# Patient Record
Sex: Female | Born: 1975
Health system: Southern US, Community
[De-identification: ages and names within clinical notes are randomized; demographics above are authoritative.]

## PROBLEM LIST (undated history)

## (undated) DIAGNOSIS — R4689 Other symptoms and signs involving appearance and behavior: Secondary | ICD-10-CM

## (undated) DIAGNOSIS — F329 Major depressive disorder, single episode, unspecified: Secondary | ICD-10-CM

## (undated) DIAGNOSIS — I341 Nonrheumatic mitral (valve) prolapse: Secondary | ICD-10-CM

## (undated) DIAGNOSIS — Z7901 Long term (current) use of anticoagulants: Secondary | ICD-10-CM

## (undated) DIAGNOSIS — G43909 Migraine, unspecified, not intractable, without status migrainosus: Secondary | ICD-10-CM

## (undated) DIAGNOSIS — R002 Palpitations: Secondary | ICD-10-CM

## (undated) DIAGNOSIS — F319 Bipolar disorder, unspecified: Secondary | ICD-10-CM

## (undated) DIAGNOSIS — I8390 Asymptomatic varicose veins of unspecified lower extremity: Secondary | ICD-10-CM

## (undated) DIAGNOSIS — F32A Depression, unspecified: Secondary | ICD-10-CM

## (undated) DIAGNOSIS — Q8740 Marfan's syndrome, unspecified: Secondary | ICD-10-CM

## (undated) DIAGNOSIS — R4589 Other symptoms and signs involving emotional state: Secondary | ICD-10-CM

## (undated) DIAGNOSIS — F419 Anxiety disorder, unspecified: Secondary | ICD-10-CM

## (undated) DIAGNOSIS — I77810 Thoracic aortic ectasia: Secondary | ICD-10-CM

## (undated) HISTORY — DX: Thoracic aortic ectasia: I77.810

## (undated) HISTORY — DX: Depression, unspecified: F32.A

## (undated) HISTORY — DX: Bipolar disorder, unspecified: F31.9

## (undated) HISTORY — DX: Long term (current) use of anticoagulants: Z79.01

## (undated) HISTORY — PX: BREAST CYST EXCISION: SHX579

## (undated) HISTORY — DX: Nonrheumatic mitral (valve) prolapse: I34.1

## (undated) HISTORY — DX: Major depressive disorder, single episode, unspecified: F32.9

## (undated) HISTORY — DX: Palpitations: R00.2

## (undated) HISTORY — DX: Other symptoms and signs involving emotional state: R45.89

## (undated) HISTORY — DX: Other symptoms and signs involving appearance and behavior: R46.89

## (undated) HISTORY — DX: Marfan syndrome, unspecified: Q87.40

## (undated) HISTORY — DX: Migraine, unspecified, not intractable, without status migrainosus: G43.909

## (undated) HISTORY — DX: Asymptomatic varicose veins of unspecified lower extremity: I83.90

## (undated) HISTORY — DX: Anxiety disorder, unspecified: F41.9

## (undated) HISTORY — PX: ANKLE SURGERY: SHX546

## (undated) HISTORY — PX: INNER EAR SURGERY: SHX679

---

## 1998-09-29 ENCOUNTER — Encounter: Payer: Self-pay | Admitting: Emergency Medicine

## 1998-09-29 ENCOUNTER — Emergency Department (HOSPITAL_COMMUNITY): Admission: EM | Admit: 1998-09-29 | Discharge: 1998-09-29 | Payer: Self-pay | Admitting: Emergency Medicine

## 2001-10-20 ENCOUNTER — Emergency Department (HOSPITAL_COMMUNITY): Admission: EM | Admit: 2001-10-20 | Discharge: 2001-10-20 | Payer: Self-pay | Admitting: Emergency Medicine

## 2001-12-27 ENCOUNTER — Emergency Department (HOSPITAL_COMMUNITY): Admission: EM | Admit: 2001-12-27 | Discharge: 2001-12-27 | Payer: Self-pay

## 2006-01-29 ENCOUNTER — Inpatient Hospital Stay (HOSPITAL_COMMUNITY): Admission: EM | Admit: 2006-01-29 | Discharge: 2006-02-02 | Payer: Self-pay | Admitting: Emergency Medicine

## 2006-08-25 ENCOUNTER — Emergency Department (HOSPITAL_COMMUNITY): Admission: EM | Admit: 2006-08-25 | Discharge: 2006-08-25 | Payer: Self-pay | Admitting: Emergency Medicine

## 2007-11-02 ENCOUNTER — Emergency Department (HOSPITAL_COMMUNITY): Admission: EM | Admit: 2007-11-02 | Discharge: 2007-11-02 | Payer: Self-pay | Admitting: Emergency Medicine

## 2007-11-08 ENCOUNTER — Emergency Department (HOSPITAL_COMMUNITY): Admission: EM | Admit: 2007-11-08 | Discharge: 2007-11-08 | Payer: Self-pay | Admitting: Emergency Medicine

## 2010-10-11 LAB — HM PAP SMEAR

## 2011-02-26 NOTE — H&P (Signed)
NAMEROWYNN, MCWEENEY NO.:  [id number]   MEDICAL RECORD NO.:  [id number]          PATIENT TYPE:  EMS   LOCATION:  ED                           FACILITY:  [facility]   PHYSICIAN:  Lonia Blood, M.D.DATE OF BIRTH:  Sep 08, 1976   DATE OF ADMISSION:  01/29/2006  DATE OF DISCHARGE:                                HISTORY & PHYSICAL   PRIMARY CARE PHYSICIAN:  Unassigned.   CHIEF COMPLAINT:  Tylenol toxicity.   HISTORY OF PRESENT ILLNESS:  Ms. Melissa Hayden is a 35 year old female with a  history of major depression. She was evaluated at Mental Health today after  she informed them that she had taken approximately thirty-six 500 mg  strength Tylenol tablets at 3:30 in the morning on Friday, January 28, 2006.  Mental Health processed paper work to have her involuntarily committed for  suicide and then transferred her to [facility] for medical treatment. In  the emergency room she has no complaints. She does report that approximately  24 hours ago she was suffering with severe nausea. This fortunately has  improved significantly. She has no other complaints at the present time.   REVIEW OF SYSTEMS:  A comprehensive review of systems was done and  unremarkable with exception of elements noted in the history of present  illness above.   PAST MEDICAL HISTORY:  1.  Marfan syndrome followed by a specialist at [facility] with last      follow up approximately 1 year ago.  2.  Major depression with psychosis.  3.  Tobacco abuse of approximately 1 pack per day for multiple years.   MEDICATIONS:  None.   ALLERGIES:  PENICILLIN - CAUSED AN UNKNOWN REACTION.   FAMILY HISTORY:  The patient patient's father has Marfan's. The patient's  mother has migraine headaches.   SOCIAL HISTORY:  The patient occasionally partakes of alcohol but never more  than one drink a day. She lives in [place]. She is divorced. She is  presently unemployed.   DATA REVIEWED:  Sodium is  normal, potassium is low at 32, chloride and  bicarb are normal, BUN and creatinine are normal, calcium is 8.9, serum  glucose is 68. AST is 202, ALT is 239 (both are elevated), alkaline  phosphatase is normal, total bilirubin is elevated at 2.4, total protein and  albumin are normal. INR is 1.4, PTT is 30. CBC is normal. Acetaminophen  level is less than 10. Alcohol level is less than 5. Urine drug screen is  negative. Salicylate level is less than 4. Urine pregnancy is negative.   A 12-lead EKG reveals normal sinus rhythm with T wave inversions in 2, 3 and  aVF, V5 and V6.   PHYSICAL EXAMINATION:  VITAL SIGNS:  Temperature 98.9, blood pressure 95/61,  heart rate 101, respiratory rate 20, O2 saturations 98% on room air.  GENERAL:  Well-developed, well-nourished female with phenotype consistent  with Marfan's.  HEENT:  Normocephalic, atraumatic. Pupils are equal, round and reactive to  light and accommodation. Extraocular muscles are intact. OC/OP clear.  NECK:  No jugular venous  distension, no lymphadenopathy.  LUNGS:  Clear to auscultation bilaterally, no wheezes, rhonchi.  CARDIOVASCULAR:  Regular rate and rhythm with a mid systolic click  consistent with mitral valve prolapse without gallop or rub.  ABDOMEN:  Thin, nontender, nondistended, soft, bowel sounds present, no  hepatosplenomegaly and no rebound, no ascites.  EXTREMITIES:  The patient has markedly elongated upper extremities, there is  no cyanosis. There is no clubbing. There is no edema.  NEUROLOGIC:  Alert and oriented x4. Affect is flat but the patient is  responsive. She displays 5/5 strength in bilateral upper and lower  extremities. She is intact to sensation and touch throughout.   IMPRESSION AND PLAN:  1.  Tylenol toxicity - patient ingested approximately thirty-six 500 mg      acetaminophen tablets at approximately 3:30 in the morning on Friday,      January 28, 2006. At this point she is far enough out that an       acetaminophen level is no longer detectable in the bloodstream. Given      the extent of her ingestion and the fact that she already elevated liver      function tests however, it does appear to be prudent to treat her with a      full course of Mucomyst. Fortunately, coags are stable at this time.      Hopefully we will see improvement in her liver function tests. We will      monitor LFTs and coags. The patient will be gently hydrated in addition      to her Mucomyst.  2.  Hypokalemia - patient does have a hypokalemia with a potassium of 3.2.      We will check a magnesium. this is likely related to the patient's      hepatic insult. We will supplement patient with p.o. potassium as well      as potassium in her IV. We will check a phosphorus and the magnesium as      well.  3.  Tobacco abuse - patient has been counseled on the multiple deleterious      effects of tobacco abuse and advised to discontinue it immediately. We      will request a tobacco sensation consultation in this hospitalization to      further encourage this.  4.  Major depression with psychosis and suicide attempt - patient has been      involuntarily committed. She is not free to leave the hospital. She will      require a 24 hour sitter and suicide precautions. At such time that she      is medically clear she will need to be transferred to appropriate mental      health facility.  5.  Marfan's - clinically patient does appear to have mitral valve prolapse      which can often be seen in Marfan's. It is not clear to me why she is      not on a beta blocker. Beta blocker is indicated in patient's with      Marfan's. During a stressful situation as this I feel that it is      important to use this. We will place the patient on telemetry. I will      check a PA and lateral chest x-ray to assess the patient's Marfan's. In      that she does receive routine follow up at [facility] I will not     pursue further  evaluation at this time unless chest x-ray is remarkable.      I will begin Lopressor 25 mg p.o. b.i.d. and follow the patient on      telemetry to assure that she tolerates it well.  6.  T wave inversions - patient has very clear T wave inversions in 2, 3,      aVF, V5 and V6. The exact etiology of these is not clear to me. The      patient has no cardiac symptoms whatsoever. We will follow her on      telemetry and recheck a full 12-lead EKG in the morning.      Lonia Blood, M.D.  Electronically Signed     JTM/MEDQ  D:  01/29/2006  T:  01/29/2006  Job:  [id number]

## 2011-02-26 NOTE — Discharge Summary (Signed)
**Note Melissa via Obfuscation** Hayden, Melissa Hayden                  ACCOUNT NO.:  [id number]   MEDICAL RECORD NO.:  [id number]          PATIENT TYPE:  INP   LOCATION:  1406                         FACILITY:  [facility]   PHYSICIAN:  Elliot Cousin, M.D.    DATE OF BIRTH:  1975/10/21   DATE OF ADMISSION:  01/29/2006  DATE OF DISCHARGE:  02/02/2006                                 DISCHARGE SUMMARY   DISCHARGE DIAGNOSES:  1.  Suicide attempt with intentional Tylenol overdose.  2.  Major depression.  3.  Elevated liver transaminases/hepatopathy secondary to Tylenol toxicity.  4.  Incidental finding of a paraspinal mass per CXR imaging.  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  5.  Marfan's syndrome.   DISCHARGE MEDICATIONS:  1.  Metoprolol 25 mg b.i.d. (hold for systolic blood pressure less than 100      and for heart rate less than 60).  2.  Protonix 40 mg daily.  3.  Lexapro 10 mg daily.  4.  Trazodone 50 mg q.h.s.  5.  Nicotine patch p.r.n.   CONSULTATIONS:  Psychiatrist, Antonietta Breach and colleague.   PROCEDURES PERFORMED:  MRI of the chest on February 01, 2006.  The results  revealed 32 x 53 mm lateral meningocele on the right, T10 to T11, without  enhancement or solid component.  Widening of the foramen, no cord deformity,  no other meningoceles.  Hemangioma at T5.   HISTORY OF PRESENT ILLNESS:  The patient is a 35 year old lady with a past  medical history significant for major depression, who presented to the  emergency department on January 29, 2006 after ingesting thirty-six 500 mg  strength Tylenol tablets.  The patient was seen at the mental health  facility prior to admission to the emergency department.  Involuntary  commitment papers were completed, and the patient was transferred to [facility] for further evaluation and management.  For additional  details, please see the dictated history and physical.   HOSPITAL COURSE:  1.  MAJOR DEPRESSION, SUICIDE ATTEMPT WITH INTENTIONAL TYLENOL  OVERDOSE,      HEPATOPATHY/ ELEVATED TRANSAMINASES.  The patient was started on      Mucomyst therapy immediately.  Supportive treatment was given, as well.      The patient was started on maintenance IV fluids, and prophylactic      Protonix.  Her liver transaminases were monitored daily.  Her PT and INR      were monitored, as well.  The patient's serum potassium was mildly low      at 3.2 on admission.  She was repleted with potassium chloride during      the hospitalization.  On admission, her acetaminophen level was less      than 10. It was repeated at less than 10. The SGOT was elevated at 202,      the SGPT was elevated at 239, and the total bilirubin was elevated at      2.4.  Her albumin was 3.8 and  her PT was 17.1.  A salicylate level was  ordered and was less than 4.0.  An alcohol level was ordered and was      less than 5.  An urine drug screen was negative.  The patient completed      a 17-dose course of Mucomyst during the hospitalization.  Her follow up      hepatic panel at the time of discharge reveals a total bilirubin of 0.2,      indirect bilirubin of 0.4, alkaline phosphatase of 38, SGOT of 78, SGPT      of 324, and total protein of 5.3.  Her albumin was slightly low.  The      follow up PT was 13.9.  The patient experienced no significant sequelae      of the acetominophen toxicity.  There were no signs of hepatic failure.      Her diet was advanced, and she tolerated solid foods without any      complaints of nausea.  A 24-hour sitter was ordered for close      monitoring.  Psychiatrist, Dr. Jeanie Sewer, was consulted for further      evaluation and management.  Dr. Providence Crosby assistant, Maralyn Sago, provided      the initial evaluation and recommendations.  Per Maralyn Sago, the patient had      had major stressors over the past 2 months.  This led to the patient's      suicide attempt.  Sarah recommended starting Lexapro 10 mg daily and      trazodone 50 to ameliorate her  symptoms.  Per Dr. Providence Crosby      assessment, the patient certainly needed inpatient psychiatric      treatment, whether it was voluntary or involuntary.  The patient was in      agreement with inpatient psychiatric management and will be discharged      today.  Currently, she is hemodynamically stable and in no acute      distress.   1.  MARFAN'S SYNDROME.  The patient has been followed by Dr. Francena Hanly      at [facility] in [place].  The patient admits that she has      not been compliant with follow up appointments.  On admission, her chest      x-ray revealed a prominent aortic root, commonly seen with aortic valve      dysfunction in patient's with Marfan's syndrome.  Her EKG revealed T      wave inversions, though the patient had no complaints of chest pain.      Beta blockade therapy is ususally indicated with Marfan's syndrome and      therefore Lopressor was started .  This should continue.   1.  THORACIC MENINGOCELE. The chest xray on admission also revealed an      incidental finding of a paraspinal mass. Given this finding, an MRI of      the thoracic spine was ordered.  The MRI in essence revealed a thoracic      meningocele which appeared to be without any compromise on the spinal      cord.  The meningocele did not have a solid component, and there was no      surrounding inflammation.  Given these benign findings, a neurosurgery      consult was not obtained.  The patient was strongly advised to follow up      with Dr. Hal Morales in 1-2 weeks following discharge from [facility].   DISCHARGE DISPOSITION:  The patient is improved and in stable condition.  The plan is to transfer her to [facility] for further inpatient  psychiatric treatment.  The patient was advised to follow up with Dr. Hal Morales  in 1-2 weeks following discharge from [facility].  The patient should also have her liver transaminases monitored at least once or twice  monthly for  the next 4-8 weeks.      Elliot Cousin, M.D.  Electronically Signed     DF/MEDQ  D:  02/02/2006  T:  02/02/2006  Job:  [id number]   cc:   Francena Hanly, M.D.  [facility] [address] [facility]

## 2013-11-07 ENCOUNTER — Other Ambulatory Visit: Payer: Self-pay | Admitting: Family Medicine

## 2013-11-07 ENCOUNTER — Encounter: Payer: Self-pay | Admitting: Family Medicine

## 2013-11-07 ENCOUNTER — Ambulatory Visit (INDEPENDENT_AMBULATORY_CARE_PROVIDER_SITE_OTHER): Payer: 59 | Admitting: Family Medicine

## 2013-11-07 ENCOUNTER — Ambulatory Visit (HOSPITAL_BASED_OUTPATIENT_CLINIC_OR_DEPARTMENT_OTHER)
Admission: RE | Admit: 2013-11-07 | Discharge: 2013-11-07 | Disposition: A | Discharge status: Home / Self Care | Payer: 59 | Source: Ambulatory Visit | Attending: Family Medicine | Admitting: Family Medicine

## 2013-11-07 VITALS — BP 109/75 | HR 69 | Resp 16 | Ht 74.0 in | Wt 142.0 lb

## 2013-11-07 DIAGNOSIS — F39 Unspecified mood [affective] disorder: Secondary | ICD-10-CM

## 2013-11-07 DIAGNOSIS — Z1231 Encounter for screening mammogram for malignant neoplasm of breast: Secondary | ICD-10-CM | POA: Insufficient documentation

## 2013-11-07 DIAGNOSIS — Z13220 Encounter for screening for lipoid disorders: Secondary | ICD-10-CM

## 2013-11-07 DIAGNOSIS — Z3202 Encounter for pregnancy test, result negative: Secondary | ICD-10-CM

## 2013-11-07 DIAGNOSIS — E559 Vitamin D deficiency, unspecified: Secondary | ICD-10-CM

## 2013-11-07 DIAGNOSIS — Z5181 Encounter for therapeutic drug level monitoring: Secondary | ICD-10-CM

## 2013-11-07 DIAGNOSIS — Q8740 Marfan's syndrome, unspecified: Secondary | ICD-10-CM

## 2013-11-07 LAB — CBC WITH DIFFERENTIAL/PLATELET
Basophils Absolute: 0 10*3/uL (ref 0.0–0.1)
Basophils Relative: 0 % (ref 0–1)
Eosinophils Absolute: 0.2 10*3/uL (ref 0.0–0.7)
Eosinophils Relative: 3 % (ref 0–5)
HCT: 40.5 % (ref 36.0–46.0)
Hemoglobin: 13.5 g/dL (ref 12.0–15.0)
Lymphocytes Relative: 50 % — ABNORMAL HIGH (ref 12–46)
Lymphs Abs: 3.1 10*3/uL (ref 0.7–4.0)
MCH: 29.4 pg (ref 26.0–34.0)
MCHC: 33.3 g/dL (ref 30.0–36.0)
MCV: 88.2 fL (ref 78.0–100.0)
Monocytes Absolute: 0.5 10*3/uL (ref 0.1–1.0)
Monocytes Relative: 8 % (ref 3–12)
Neutro Abs: 2.4 10*3/uL (ref 1.7–7.7)
Neutrophils Relative %: 39 % — ABNORMAL LOW (ref 43–77)
Platelets: 224 10*3/uL (ref 150–400)
RBC: 4.59 MIL/uL (ref 3.87–5.11)
RDW: 13.5 % (ref 11.5–15.5)
WBC: 6.1 10*3/uL (ref 4.0–10.5)

## 2013-11-07 LAB — COMPLETE METABOLIC PANEL WITH GFR
ALT: 9 U/L (ref 0–35)
AST: 12 U/L (ref 0–37)
Albumin: 4.1 g/dL (ref 3.5–5.2)
Alkaline Phosphatase: 29 U/L — ABNORMAL LOW (ref 39–117)
BUN: 13 mg/dL (ref 6–23)
CO2: 27 mEq/L (ref 19–32)
Calcium: 9.1 mg/dL (ref 8.4–10.5)
Chloride: 108 mEq/L (ref 96–112)
Creat: 0.68 mg/dL (ref 0.50–1.10)
GFR, Est African American: >89 mL/min
GFR, Est Non African American: >89 mL/min
Glucose, Bld: 48 mg/dL — ABNORMAL LOW (ref 70–99)
Potassium: 4.4 mEq/L (ref 3.5–5.3)
Sodium: 141 mEq/L (ref 135–145)
Total Bilirubin: 0.8 mg/dL (ref 0.2–1.2)
Total Protein: 6.2 g/dL (ref 6.0–8.3)

## 2013-11-07 LAB — CHOLESTEROL, TOTAL: Cholesterol: 149 mg/dL (ref 0–200)

## 2013-11-07 MED ORDER — DIVALPROEX SODIUM ER 500 MG PO TB24: ORAL_TABLET | ORAL | Status: DC

## 2013-11-07 MED ORDER — CITALOPRAM HYDROBROMIDE 20 MG PO TABS: ORAL_TABLET | ORAL | Status: DC

## 2013-11-07 NOTE — Patient Instructions (Signed)
1)  Birth Control -  Return for your repeat pregnancy test and Depo-Provera shot in 7 days.  Please abstain from sex the next 7 days and for 7 days after the Depo Shot.   2)  Mood - Start on the Celexa 10 mg (1/2 tab) at night for 7 days then increase to 20 mg.  Start on Depakote ER 500 mg at night for week 1 then increase to 1000 mg on week 2 then 1500 by week.  Call Dr. Starleen Arms office and see if you can see her in 3-4 weeks or as soon after that as you can.  3)  Preventative Care - Mammogram; Return in 3 weeks for a CPE.  We'll do a pap smear and go over your lab results.  We'll also get you caught up on your vaccinations.     Mood Disorders Mood disorders are conditions that affect the way a person feels emotionally. The main mood disorders include:  Depression.  Bipolar disorder.  Dysthymia. Dysthymia is a mild, lasting (chronic) depression. Symptoms of dysthymia are similar to depression, but not as severe.  Cyclothymia. Cyclothymia includes mood swings, but the highs and lows are not as severe as they are in bipolar disorder. Symptoms of cyclothymia are similar to those of bipolar disorder, but less extreme. CAUSES  Mood disorders are probably caused by a combination of factors. People with mood disorders seem to have physical and chemical changes in their brains. Mood disorders run in families, so there may be genetic causes. Severe trauma or stressful life events may also increase the risk of mood disorders.  SYMPTOMS  Symptoms of mood disorders depend on the specific type of condition. Depression symptoms include:  Feeling sad, worthless, or hopeless.  Negative thoughts.  Inability to enjoy one's usual activities.  Low energy.  Sleeping too much or too little.  Appetite changes.  Crying.  Concentration problems.  Thoughts of harming oneself. Bipolar disorder symptoms include:  Periods of depression (see above symptoms).  Mood swings, from sadness and depression, to  abnormal elation and excitement.  Periods of mania:  Racing thoughts.  Fast speech.  Poor judgment, and careless, dangerous choices.  Decreased need for sleep.  Risky behavior.  Difficulty concentrating.  Irritability.  Increased energy.  Increased sex drive. DIAGNOSIS  There are no blood tests or X-rays that can confirm a mood disorder. However, your caregiver may choose to run some tests to make sure that there is not another physical cause for your symptoms. A mood disorder is usually diagnosed after an in-depth interview with a caregiver. TREATMENT  Mood disorders can be treated with one or more of the following:  Medicine. This may include antidepressants, mood-stabilizers, or anti-psychotics.  Psychotherapy (talk therapy).  Cognitive behavioral therapy. You are taught to recognize negative thoughts and behavior patterns, and replace them with healthy thoughts and behaviors.  Electroconvulsive therapy. For very severe cases of deep depression, a series of treatments in which an electrical current is applied to the brain.  Vagus nerve stimulation. A pulse of electricity is applied to a portion of the brain.  Transcranial magnetic stimulation. Powerful magnets are placed on the head that produce electrical currents.  Hospitalization. In severe situations, or when someone is having serious thoughts of harming him or herself, hospitalization may be necessary in order to keep the person safe. This is also done to quickly start and monitor treatment. HOME CARE INSTRUCTIONS   Take your medicine exactly as directed.  Attend all of your  therapy sessions.  Try to eat regular, healthy meals.  Exercise daily. Exercise may improve mood symptoms.  Get good sleep.  Do not drink alcohol or use pot or other drugs. These can worsen mood symptoms and cause anxiety and psychosis.  Tell your caregiver if you develop any side effects, such as feeling sick to your stomach  (nauseous), dry mouth, dizziness, constipation, drowsiness, tremor, weight gain, or sexual symptoms. He or she may suggest things you can do to improve symptoms.  Learn ways to cope with the stress of having a chronic illness. This includes yoga, meditation, tai chi, or participating in a support group.  Drink enough water to keep your urine clear or pale yellow. Eat a high-fiber diet. These habits may help you avoid constipation from your medicine. SEEK IMMEDIATE MEDICAL CARE IF:  Your mood worsens.  You have thoughts of hurting yourself or others.  You cannot care for yourself.  You develop the sensation of hearing or seeing something that is not actually present (auditory or visual hallucinations).  You develop abnormal thoughts. Document Released: 07/25/2009 Document Revised: 12/20/2011 Document Reviewed: 07/25/2009 [facility] Patient Information 2014 [place], [place].

## 2013-11-07 NOTE — Progress Notes (Signed)
Subjective:    Patient ID: Melissa Hayden, female    DOB: 1976-03-24, 38 y.o.   MRN: [id number]  HPI  Melissa Hayden is here today to establish care with our practice.  She was referred to [name] by her roommate Everlean Alstrom).  She has not had a PCP in several years.  She would like to discuss the conditions listed below:   1)  Marfan Syndrome - She is followed by Dr. Maple Hudson with [facility] in [facility] for heart problems associated with Marfan Syndrome.  She currently takes atenolol to control her heart rate.    2)  Contraception:  She has been on Depo-Provera in the past and would like to get back on it.    3)  Anxiety:  She has struggled with this problem for years and feels that it has worsened in the past six months.  She is unsure what causes her anxiety. She occasionally gets panic attacks.  She also struggles with insomnia which she feels is a problem related to her anxiety.  She takes Calm-Sleep for her insomnia which does not help her very much.      Review of Systems  Constitutional: Negative for activity change, fatigue and unexpected weight change.  HENT: Negative.   Eyes: Negative.   Respiratory: Negative for shortness of breath.   Cardiovascular: Negative for chest pain, palpitations and leg swelling.  Gastrointestinal: Negative for diarrhea and constipation.  Endocrine: Negative.   Genitourinary: Negative for difficulty urinating.  Musculoskeletal: Negative.   Skin: Negative.   Neurological: Negative.   Hematological: Negative for adenopathy. Does not bruise/bleed easily.  Psychiatric/Behavioral: Positive for sleep disturbance and decreased concentration. Negative for dysphoric mood. The patient is nervous/anxious.      Past Medical History  Diagnosis Date  . Marfan's syndrome     Dr. Atilano Median  . Palpitations   . Mitral valve prolapse   . Ascending aorta dilatation   . Depression   . Anxiety   . Varicose veins   . Migraine   . Suicidal behavior    Tylenol Overdose     Past Surgical History  Procedure Laterality Date  . Ankle surgery Left   . Inner ear surgery Right   . 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  She stopped smoking cigarettes in 04/2013 and started using e-cigarettes to help her quit.  She has weaned herself down on the nicotine.     Alcohol Use:  None   Drug Use:  None   Diet:  Regular   Exercise:  None   Hobbies:  Reading, Traveling                  Family History  Problem Relation Age of Onset  . Cancer Mother 20    Breast   . Marfan syndrome Father   . Heart disease Father   . Marfan syndrome Sister   . Marfan syndrome Paternal Grandmother   . Marfan syndrome Sister      No Known Allergies   Immunization History  Administered Date(s) Administered  . Td 10/12/1999  Objective:   Physical Exam  Nursing note and vitals reviewed. Constitutional: She is oriented to person, place, and time.  Eyes: Conjunctivae are normal. No scleral icterus.  Neck: Neck supple. No thyromegaly present.  Cardiovascular: Normal rate, regular rhythm and normal heart sounds.   Pulmonary/Chest: Effort normal and breath sounds normal.  Musculoskeletal: She exhibits no edema and no tenderness.       Arms: Lymphadenopathy:    She has no cervical adenopathy.  Neurological: She is alert and oriented to person, place, and time.  Skin: Skin is warm and dry.  Psychiatric: She has a normal mood and affect. Her behavior is normal. Judgment and thought content normal.      Assessment & Plan:   Embree was seen today for establish care.  Diagnoses and associated orders for this visit:  Unspecified  episodic mood disorder - citalopram (CELEXA) 20 MG tablet; Take 1/2 tab at night for 1 week then increase to 1 tab - divalproex (DEPAKOTE ER) 500 MG 24 hr tablet; Start with 1 tab at night for 7 days then increase to 2 for 7 days then increase to 3  Unspecified vitamin D deficiency - Vit D  25 hydroxy (rtn osteoporosis monitoring)  Need for lipid screening - Cholesterol, Total  Encounter for therapeutic drug monitoring - CBC w/Diff - COMPLETE METABOLIC PANEL WITH GFR  Marfan's syndrome  Negative pregnancy test - POCT urine pregnancy   TIME SPENT "FACE TO FACE" WITH PATIENT -  30 MINS

## 2013-11-08 LAB — VITAMIN D 25 HYDROXY (VIT D DEFICIENCY, FRACTURES): Vit D, 25-Hydroxy: 33 ng/mL (ref 30–89)

## 2013-11-08 LAB — POCT URINE PREGNANCY: Preg Test, Ur: NEGATIVE

## 2013-11-14 ENCOUNTER — Ambulatory Visit (INDEPENDENT_AMBULATORY_CARE_PROVIDER_SITE_OTHER): Payer: 59 | Admitting: *Deleted

## 2013-11-14 VITALS — BP 110/78 | HR 69 | Resp 16 | Wt 142.0 lb

## 2013-11-14 DIAGNOSIS — Z3202 Encounter for pregnancy test, result negative: Secondary | ICD-10-CM

## 2013-11-14 DIAGNOSIS — Z3009 Encounter for other general counseling and advice on contraception: Secondary | ICD-10-CM

## 2013-11-14 MED ORDER — MEDROXYPROGESTERONE ACETATE 150 MG/ML IM SUSP
150.0000 mg | Freq: Once | INTRAMUSCULAR | Status: AC
  Administered 2013-11-14: 150 mg via INTRAMUSCULAR

## 2013-11-14 NOTE — Progress Notes (Signed)
   Subjective:    Patient ID: Melissa Hayden, female    DOB: May 17, 1976, 38 y.o.   MRN: [id number]  HPI  Opaline is here to receive her Depo-Provera shot.  She has been doing well since her last office visit.    Review of Systems  Genitourinary: Negative for menstrual problem.  All other systems reviewed and are negative.       Objective:   Physical Exam        Assessment & Plan:

## 2013-11-14 NOTE — Assessment & Plan Note (Signed)
The patient received a urine pregnancy test which was negative.  She received her injection without any problem. She is to follow up in 3 months for her next injection which is due before Feb 13, 2014.

## 2013-11-14 NOTE — Assessment & Plan Note (Signed)
Her pregnancy test was negative.

## 2013-11-22 ENCOUNTER — Other Ambulatory Visit: Payer: 59 | Admitting: Family Medicine

## 2013-12-13 ENCOUNTER — Encounter (INDEPENDENT_AMBULATORY_CARE_PROVIDER_SITE_OTHER): Payer: Self-pay

## 2013-12-13 ENCOUNTER — Ambulatory Visit (INDEPENDENT_AMBULATORY_CARE_PROVIDER_SITE_OTHER): Payer: 59 | Admitting: Family Medicine

## 2013-12-13 ENCOUNTER — Encounter: Payer: Self-pay | Admitting: Family Medicine

## 2013-12-13 VITALS — BP 118/67 | HR 67 | Wt 152.0 lb

## 2013-12-13 DIAGNOSIS — F39 Unspecified mood [affective] disorder: Secondary | ICD-10-CM

## 2013-12-13 MED ORDER — DIVALPROEX SODIUM ER 500 MG PO TB24: 1500.0000 mg | ORAL_TABLET | Freq: Every day | ORAL | Status: DC

## 2013-12-13 MED ORDER — CITALOPRAM HYDROBROMIDE 20 MG PO TABS: 20.0000 mg | ORAL_TABLET | Freq: Every day | ORAL | Status: DC

## 2013-12-13 NOTE — Progress Notes (Signed)
Subjective:    Patient ID: Melissa Hayden, female    DOB: [birth year], 38 y.o.   MRN: [id number]  HPI  Melissa Hayden is here today to follow up on her anxiety.  She is currently doing well with her Depakote and Celexa.  She has noted some improvement with her anxiety level and sleep.  She would like to continue on this combination of medications.  She tried to schedule an appointment with Dr Toy Care but they can't see her until 03/13/14.      Review of Systems  Constitutional: Negative for activity change, fatigue and unexpected weight change.  HENT: Negative.   Eyes: Negative.   Respiratory: Negative for shortness of breath.   Cardiovascular: Negative for chest pain, palpitations and leg swelling.  Gastrointestinal: Negative for diarrhea and constipation.  Endocrine: Negative.   Genitourinary: Negative for difficulty urinating.  Musculoskeletal: Negative.   Skin: Negative.   Neurological: Negative.   Hematological: Negative for adenopathy. Does not bruise/bleed easily.  Psychiatric/Behavioral: Negative for sleep disturbance and dysphoric mood. Self-injury: Improved  The patient is nervous/anxious.     Past Medical History  Diagnosis Date  . Marfan's syndrome     Dr. Atilano Median  . Palpitations   . Mitral valve prolapse   . Ascending aorta dilatation   . Depression   . Anxiety   . Varicose veins   . Migraine   . Suicidal behavior     Tylenol Overdose     Past Surgical History  Procedure Laterality Date  . Ankle surgery Left   . Inner ear surgery Right   . 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  She stopped smoking cigarettes in 04/2013 and started using e-cigarettes to help  her quit.  She has weaned herself down on the nicotine.     Alcohol Use:  None   Drug Use:  None   Diet:  Regular   Exercise:  None   Hobbies:  Reading, Traveling                  Family History  Problem Relation Age of Onset  . Cancer Mother 60    Breast   . Marfan syndrome Father   . Heart disease Father   . Marfan syndrome Sister   . Marfan syndrome Paternal Grandmother   . Marfan syndrome Sister      Current Outpatient Prescriptions on File Prior to Visit  Medication Sig Dispense Refill  . atenolol (TENORMIN) 25 MG tablet Take 25 mg by mouth daily.       No current facility-administered medications on file prior to visit.     No Known Allergies   Immunization History  Administered Date(s) Administered  . Td 10/12/1999       Objective:   Physical Exam  Nursing note and vitals reviewed. Constitutional: She appears well-nourished. No distress.  HENT:  Head: Normocephalic.  Eyes: No scleral icterus.  Neck: No thyromegaly present.  Cardiovascular: Normal rate, regular rhythm and normal heart sounds.   Pulmonary/Chest: Effort normal and breath sounds normal.  Abdominal: There is no tenderness.  Musculoskeletal: She exhibits  no edema and no tenderness.  Neurological: She is alert.  Skin: Skin is warm and dry.  Psychiatric: She has a normal mood and affect. Her behavior is normal. Judgment and thought content normal.      Assessment & Plan:    Melissa Hayden was seen today for follow-up.  Diagnoses and associated orders for this visit:  Unspecified episodic mood disorder - divalproex (DEPAKOTE ER) 500 MG 24 hr tablet; Take 3 tablets (1,500 mg total) by mouth at bedtime. - citalopram (CELEXA) 20 MG tablet; Take 1 tablet (20 mg total) by mouth at bedtime.

## 2013-12-13 NOTE — Patient Instructions (Signed)
1)  Blood Sugar - Make sure that you get some type of protein with every meal to help stabilize your blood sugar.    2)  Mood - We'll keep you on your current regimen until you see Dr. Toy Care.    3)  Calcium/Vitamin D - Add Citracal Slow Release to your multivitamin daily.  (1200-1800 mg per day - Calcium recommendation)     Hypoglycemia (Low Blood Sugar) Hypoglycemia is when the glucose (sugar) in your blood is too low. Hypoglycemia can happen for many reasons. It can happen to people with or without diabetes. Hypoglycemia can develop quickly and can be a medical emergency.  CAUSES  Having hypoglycemia does not mean that you will develop diabetes. Different causes include:  Missed or delayed meals or not enough carbohydrates eaten.  Medication overdose. This could be by accident or deliberate. If by accident, your medication may need to be adjusted or changed.  Exercise or increased activity without adjustments in carbohydrates or medications.  A nerve disorder that affects body functions like your heart rate, blood pressure and digestion (autonomic neuropathy).  A condition where the stomach muscles do not function properly (gastroparesis). Therefore, medications may not absorb properly.  The inability to recognize the signs of hypoglycemia (hypoglycemic unawareness).  Absorption of insulin  may be altered.  Alcohol consumption.  Pregnancy/menstrual cycles/postpartum. This may be due to hormones.  Certain kinds of tumors. This is very rare. SYMPTOMS   Sweating.  Hunger.  Dizziness.  Blurred vision.  Drowsiness.  Weakness.  Headache.  Rapid heart beat.  Shakiness.  Nervousness. DIAGNOSIS  Diagnosis is made by monitoring blood glucose in one or all of the following ways:  Fingerstick blood glucose monitoring.  Laboratory results. TREATMENT  If you think your blood glucose is low:  Check your blood glucose, if possible. If it is less than 70 mg/dl, take one  of the following:  3-4 glucose tablets.   cup juice (prefer clear like apple).   cup "regular" soda pop.  1 cup milk.  -1 tube of glucose gel.  5-6 hard candies.  Do not over treat because your blood glucose (sugar) will only go too high.  Wait 15 minutes and recheck your blood glucose. If it is still less than 70 mg/dl (or below your target range), repeat treatment.  Eat a snack if it is more than one hour until your next meal. Sometimes, your blood glucose may go so low that you are unable to treat yourself. You may need someone to help you. You may even pass out or be unable to swallow. This may require you to get an injection of glucagon, which raises the blood glucose. HOME CARE INSTRUCTIONS  Check blood glucose as recommended by your caregiver.  Take medication as prescribed by your caregiver.  Follow your meal plan. Do not skip meals. Eat on time.  If you are going to drink alcohol, drink it only with meals.  Check your blood glucose before driving.  Check your blood glucose before and after exercise. If you exercise longer or different than usual, be sure to check blood glucose more frequently.  Always carry treatment with you. Glucose tablets are the easiest to carry.  Always wear medical alert jewelry or carry some form of identification that states that you have diabetes. This will alert people that you have diabetes. If you have hypoglycemia, they will have a better idea on what to do. SEEK MEDICAL CARE IF:   You are having problems keeping  your blood sugar at target range.  You are having frequent episodes of hypoglycemia.  You feel you might be having side effects from your medicines.  You have symptoms of an illness that is not improving after 3-4 days.  You notice a change in vision or a new problem with your vision. SEEK IMMEDIATE MEDICAL CARE IF:   You are a family member or friend of a person whose blood glucose goes below 70 mg/dl and is  accompanied by:  Confusion.  A change in mental status.  The inability to swallow.  Passing out. Document Released: 09/27/2005 Document Revised: 12/20/2011 Document Reviewed: 01/24/2012 [facility] Patient Information 2014 [place], [place].

## 2014-01-10 ENCOUNTER — Other Ambulatory Visit: Payer: 59 | Admitting: Family Medicine

## 2014-02-07 ENCOUNTER — Ambulatory Visit (INDEPENDENT_AMBULATORY_CARE_PROVIDER_SITE_OTHER): Payer: 59 | Admitting: *Deleted

## 2014-02-07 ENCOUNTER — Encounter (INDEPENDENT_AMBULATORY_CARE_PROVIDER_SITE_OTHER): Payer: Self-pay

## 2014-02-07 DIAGNOSIS — Z3202 Encounter for pregnancy test, result negative: Secondary | ICD-10-CM

## 2014-02-07 DIAGNOSIS — Z3009 Encounter for other general counseling and advice on contraception: Secondary | ICD-10-CM

## 2014-02-07 LAB — POCT URINE PREGNANCY: Preg Test, Ur: NEGATIVE

## 2014-02-07 MED ORDER — MEDROXYPROGESTERONE ACETATE 150 MG/ML IM SUSP
150.0000 mg | Freq: Once | INTRAMUSCULAR | Status: AC
  Administered 2014-02-07: 150 mg via INTRAMUSCULAR

## 2014-02-07 NOTE — Progress Notes (Signed)
   Subjective:    Patient ID: Melissa Hayden, female    DOB: 03-04-1976, 38 y.o.   MRN: [id number]  HPI Melissa Hayden is here today to received her Depo Prevera injection. She has returned with-in the time frame allowed for the next injection. She will need to have her next injection by July 16th- July 30th   Review of Systems  Constitutional: Negative for unexpected weight change.       Objective:   Physical Exam        Assessment & Plan:

## 2014-02-08 ENCOUNTER — Other Ambulatory Visit: Payer: 59 | Admitting: Family Medicine

## 2014-02-10 DIAGNOSIS — F3160 Bipolar disorder, current episode mixed, unspecified: Secondary | ICD-10-CM | POA: Insufficient documentation

## 2014-02-19 ENCOUNTER — Other Ambulatory Visit: Payer: 59 | Admitting: Family Medicine

## 2014-05-01 ENCOUNTER — Ambulatory Visit: Payer: 59

## 2014-05-03 ENCOUNTER — Ambulatory Visit: Payer: 59

## 2014-05-11 ENCOUNTER — Other Ambulatory Visit: Payer: Self-pay | Admitting: Family Medicine

## 2014-05-27 ENCOUNTER — Ambulatory Visit: Payer: 59 | Admitting: Physician Assistant

## 2014-05-27 DIAGNOSIS — Z0289 Encounter for other administrative examinations: Secondary | ICD-10-CM

## 2014-06-03 ENCOUNTER — Ambulatory Visit: Payer: 59 | Admitting: Physician Assistant

## 2014-06-03 DIAGNOSIS — Z0289 Encounter for other administrative examinations: Secondary | ICD-10-CM

## 2014-06-13 ENCOUNTER — Ambulatory Visit (INDEPENDENT_AMBULATORY_CARE_PROVIDER_SITE_OTHER): Payer: 59 | Admitting: Physician Assistant

## 2014-06-13 ENCOUNTER — Encounter: Payer: Self-pay | Admitting: Physician Assistant

## 2014-06-13 VITALS — BP 130/70 | HR 78 | Temp 98.2°F | Ht 74.5 in | Wt 169.0 lb

## 2014-06-13 DIAGNOSIS — F3160 Bipolar disorder, current episode mixed, unspecified: Secondary | ICD-10-CM

## 2014-06-13 DIAGNOSIS — Z3009 Encounter for other general counseling and advice on contraception: Secondary | ICD-10-CM

## 2014-06-13 DIAGNOSIS — G9689 Other specified disorders of central nervous system: Secondary | ICD-10-CM | POA: Insufficient documentation

## 2014-06-13 DIAGNOSIS — M79605 Pain in left leg: Secondary | ICD-10-CM | POA: Insufficient documentation

## 2014-06-13 DIAGNOSIS — Q8740 Marfan's syndrome, unspecified: Secondary | ICD-10-CM | POA: Insufficient documentation

## 2014-06-13 DIAGNOSIS — G968 Other specified disorders of central nervous system: Secondary | ICD-10-CM

## 2014-06-13 DIAGNOSIS — M79609 Pain in unspecified limb: Secondary | ICD-10-CM

## 2014-06-13 DIAGNOSIS — G930 Cerebral cysts: Secondary | ICD-10-CM

## 2014-06-13 DIAGNOSIS — I872 Venous insufficiency (chronic) (peripheral): Secondary | ICD-10-CM

## 2014-06-13 LAB — POCT URINE PREGNANCY: PREG TEST UR: NEGATIVE

## 2014-06-13 MED ORDER — DIVALPROEX SODIUM ER 500 MG PO TB24: ORAL_TABLET | ORAL | Status: DC

## 2014-06-13 MED ORDER — CITALOPRAM HYDROBROMIDE 20 MG PO TABS: 20.0000 mg | ORAL_TABLET | Freq: Every day | ORAL | Status: DC

## 2014-06-13 NOTE — Assessment & Plan Note (Signed)
Point-of-care urine pregnancy is negative. We'll repeat in 2 weeks. If negative at that time, we will resume Depo-Provera injections. Patient has scheduled appointment for a nurse visit.

## 2014-06-13 NOTE — Patient Instructions (Signed)
Please restart Celexa daily.  Restart your Depakote with the following directions -- Take 1 tablet (550 mg) daily x 3 days.  Then increase to 1 tablet twice daily x 3 days.  Then increase to 1 tablet three times daily. Follow-up in 1 month.  Return to clinic if 2 weeks for repeat pregnancy test.  If still negative, we will restart your Depo-Provera.  You will be contacted by a Neurosurgeon and a Vascular specialist.  You will also be contacted regarding an [name] of your left leg.  Continue compression stockings.

## 2014-06-13 NOTE — Assessment & Plan Note (Signed)
Followed by cardiology. Continue atenolol as directed.

## 2014-06-13 NOTE — Assessment & Plan Note (Signed)
Continue compression stockings. Giving the extent endorse outpatient, will refer to vascular surgery for further evaluation.

## 2014-06-13 NOTE — Assessment & Plan Note (Signed)
We'll resume citalopram and Depakote. Citalopram 20 mg--take 1 tablet by mouth daily. To reintroduce Depakote, we'll do so as follows: Take 1 tablet by mouth daily for 3 days. Increase to one tablet twice daily x 3 days. Increase to one tablet 3 times daily. Followup in one month. Will obtain Depakote level at that time. Patient has been educated on adverse effects of these medications.

## 2014-06-13 NOTE — Progress Notes (Signed)
Pre visit review using our clinic review tool, if applicable. No additional management support is needed unless otherwise documented below in the visit note. 

## 2014-06-13 NOTE — Progress Notes (Signed)
Patient presents to clinic today to establish care.  Acute Concerns: Patient wishes to restart her Depo-Provera for contraception.  Has been off of medication for around 1 year. Is not currently sexually active.   Patient complains of extensive varicose veins of bilateral lower extremities.  Wears waist-length compression stockings daily.  Endorses intermittent pain in her left lower extremity that comes and goes and is worse when gravity worsens the venous varicosities. Has never been evlauated by vascular surgery.  Patient also wishes to see Neurosurgeon.  Patient previously diagnosed with a "cyst" on her lower spine  Chronic Issues: Marfan's Syndrome -- Followed by Cardiology at [place] (Dr. Atilano Median) with routine [name] to assess Aorta and other cardiac structures.  On Atenolol for rate control.  BP normotensive.  Denies history of hypertension or hyperlipidemia.  Bipolar Disorder -- Previously on Citalopram 20 mg daily and Depakote 500 mg TID with good control over mood.  [facility] has moved practices and patient has been out of medication for several weeks.  Patient denies depressed mood, recent manic episode, SI/HI.  Health Maintenance: Dental -- up-to-date Vision -- up-to-date Immunizations -- Will be getting a Tetanus shot today.  Declines flu shot. Mammogram -- Last in 2014.  No abnormal findings.  PAP -- 3 years ago; denies hx of abnormal PAP.    Past Medical History  Diagnosis Date  . Marfan's syndrome     Dr. Atilano Median  . Palpitations   . Mitral valve prolapse   . Ascending aorta dilatation   . Depression   . Anxiety   . Varicose veins   . Migraine   . Suicidal behavior     Tylenol Overdose    Past Surgical History  Procedure Laterality Date  . Ankle surgery Left   . Inner ear surgery Right   . Breast cyst excision      No current outpatient prescriptions on file prior to visit.   No current facility-administered medications on file prior to visit.    No Known  Allergies  Family History  Problem Relation Age of Onset  . Cancer Mother 87    Breast   . Marfan syndrome Father   . Heart disease Father   . Marfan syndrome Sister   . Marfan syndrome Paternal Grandmother   . Marfan syndrome Sister     History   Social History  . Marital Status: Divorced    Spouse Name: N/A    Number of Children: N/A  . Years of Education: 16   Occupational History  . RETENTION MANAGER      [employer]   Social History Main Topics  . Smoking status: Former Smoker -- 2.00 packs/day for 18 years    Types: Cigarettes    Quit date: 04/10/2013  . Smokeless tobacco: Current User     Comment: Currently using e-cigarrettes to help her quit smoking   . Alcohol Use: No  . Drug Use: No  . Sexual Activity: Not Currently   Other Topics Concern  . 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  She stopped smoking cigarettes in 04/2013 and started using e-cigarettes to help her quit.  She has weaned herself down on the nicotine.     Alcohol Use:  None   Drug Use:  None   Diet:  Regular   Exercise:  None   Hobbies:  Reading, Traveling                Review of Systems  Constitutional: Negative for fever and weight loss.  HENT: Negative for ear discharge, ear pain, hearing loss and tinnitus.   Eyes: Negative for blurred vision, double vision, photophobia and pain.  Respiratory: Negative for cough, shortness of breath and wheezing.   Cardiovascular: Negative for chest pain and palpitations.  Gastrointestinal: Negative for heartburn, nausea, vomiting, abdominal pain, diarrhea, constipation, blood in stool and melena.  Genitourinary: Negative for dysuria, urgency, frequency,  hematuria and flank pain.  Neurological: Negative for dizziness, loss of consciousness and headaches.  Psychiatric/Behavioral: Positive for depression. Negative for suicidal ideas, hallucinations and substance abuse. The patient is nervous/anxious. The patient does not have insomnia.    BP 130/70  Pulse 78  Temp(Src) 98.2 F (36.8 C)  Ht 6' 2.5" (1.892 m)  Wt 169 lb (76.658 kg)  BMI 21.41 kg/m2  SpO2 97%  Physical Exam  Vitals reviewed. Constitutional: She is oriented to person, place, and time and well-developed, well-nourished, and in no distress.  Patient with extreme height and elongated extremities, consistent with her diagnosis of Marfan's.  HENT:  Head: Normocephalic and atraumatic.  Right Ear: External ear normal.  Left Ear: External ear normal.  Nose: Nose normal.  Mouth/Throat: Oropharynx is clear and moist. No oropharyngeal exudate.  Tympanic membranes within normal limits bilaterally.  Eyes: Conjunctivae and EOM are normal. Pupils are equal, round, and reactive to light.  Neck: Neck supple. No thyromegaly present.  Cardiovascular: Normal rate, regular rhythm, normal heart sounds and intact distal pulses.   Pulmonary/Chest: Effort normal and breath sounds normal. No respiratory distress. She has no wheezes. She has no rales. She exhibits no tenderness.  Lymphadenopathy:    She has no cervical adenopathy.  Neurological: She is alert and oriented to person, place, and time.  Skin: Skin is warm and dry. No rash noted.  Psychiatric: Affect normal.   Assessment/Plan: Bipolar disorder, mixed We'll resume citalopram and Depakote. Citalopram 20 mg--take 1 tablet by mouth daily. To reintroduce Depakote, we'll do so as follows: Take 1 tablet by mouth daily for 3 days. Increase to one tablet twice daily x 3 days. Increase to one tablet 3 times daily. Followup in one month. Will obtain Depakote level at that time. Patient has been educated on adverse effects of these medications.    Chronic venous insufficiency Continue compression stockings. Giving the extent endorse outpatient, will refer to vascular surgery for further evaluation.  Marfan's disease Followed by cardiology. Continue atenolol as directed.  Other general counseling and advice for contraceptive management Point-of-care urine pregnancy is negative. We'll repeat in 2 weeks. If negative at that time, we will resume Depo-Provera injections. Patient has scheduled appointment for a nurse visit.  Left leg pain Unclear etiology. However with patient's history, there is a possibility that it is vascular in nature. Will obtain arterial and venous ultrasound of left lower extremity. Referral to vascular surgery has already been placed.  Spinal cord cysts Referral to neurosurgery placed. Patient to bring his old records.

## 2014-06-13 NOTE — Assessment & Plan Note (Signed)
Unclear etiology. However with patient's history, there is a possibility that it is vascular in nature. Will obtain arterial and venous ultrasound of left lower extremity. Referral to vascular surgery has already been placed.

## 2014-06-13 NOTE — Assessment & Plan Note (Signed)
Referral to neurosurgery placed. Patient to bring his old records.

## 2014-06-25 ENCOUNTER — Encounter: Payer: Self-pay | Admitting: Physician Assistant

## 2014-06-27 ENCOUNTER — Ambulatory Visit: Payer: 59

## 2014-07-02 ENCOUNTER — Telehealth: Payer: Self-pay | Admitting: *Deleted

## 2014-07-02 ENCOUNTER — Other Ambulatory Visit: Payer: 59

## 2014-07-02 ENCOUNTER — Other Ambulatory Visit: Payer: Self-pay

## 2014-07-02 DIAGNOSIS — I83893 Varicose veins of bilateral lower extremities with other complications: Secondary | ICD-10-CM

## 2014-07-02 DIAGNOSIS — M79606 Pain in leg, unspecified: Secondary | ICD-10-CM

## 2014-07-02 NOTE — Telephone Encounter (Signed)
Are you truly meaning Depakote or her DepoProvera for contraception?  Please verify with patient.

## 2014-07-02 NOTE — Telephone Encounter (Signed)
Patient reported to lab for POCT Urine Pregnancy test as requested, results were negative; pt would like to know when she can get her Depakote refill/SLS Please Advise.

## 2014-07-03 NOTE — Telephone Encounter (Signed)
Can restart Depo Provera -- Patient needs to schedule nurse appointment for 1st injection within 5 days of staring menstrual period.

## 2014-07-03 NOTE — Telephone Encounter (Signed)
Checked with Angie in lab and the correct medication is DepoProvera injections/SLS

## 2014-07-05 NOTE — Telephone Encounter (Signed)
LMOM with contact name and number [for return call, if needed] RE: Depo-Provera and further provider instructions/SLS

## 2014-07-12 ENCOUNTER — Ambulatory Visit: Payer: 59 | Admitting: Physician Assistant

## 2014-07-19 ENCOUNTER — Ambulatory Visit: Payer: 59 | Admitting: Physician Assistant

## 2014-07-19 DIAGNOSIS — Z0289 Encounter for other administrative examinations: Secondary | ICD-10-CM

## 2014-07-24 ENCOUNTER — Encounter: Payer: Self-pay | Admitting: Vascular Surgery

## 2014-07-25 ENCOUNTER — Encounter (HOSPITAL_COMMUNITY): Payer: 59

## 2014-07-25 ENCOUNTER — Encounter: Payer: 59 | Admitting: Vascular Surgery

## 2014-08-09 ENCOUNTER — Telehealth: Payer: Self-pay | Admitting: Physician Assistant

## 2014-08-09 NOTE — Telephone Encounter (Signed)
Just received her Holter Monitor from [facility] revealing normal sinus rhythm overall but a large amount of premature ventricular contractions and an episode of Ventricular tachycardia.  Giving these findings, I am assuming she was set up to see a Cardiologist at that practice.  Please verify that the patient is being managed by these guys as there is no mention of this in the note.

## 2014-08-13 NOTE — Telephone Encounter (Signed)
LMOM with contact name and number [for return call, if needed - if no Cardiologist f/u has been made] RE: Holter Monitor results and further provider instructions regarding being set-up with Cardiologist to manage symptoms/SLS

## 2014-08-15 ENCOUNTER — Telehealth: Payer: Self-pay | Admitting: Physician Assistant

## 2014-08-15 NOTE — Telephone Encounter (Signed)
Pt is returning your call, please call back °

## 2014-08-16 NOTE — Telephone Encounter (Signed)
LMOM [2nd] with contact name and number [for return call, if needed] RE: If patient was set-up with Cardiologist at [facility] to manage Holter Monitor results/symptoms; if not, to please inform us, so that we can place a referral to be seen by cardiology per provider instructions/SLS  Rockwell Germany, [place] at 08/13/2014 5:57 PM     Status: Signed       Expand All Collapse All   LMOM with contact name and number [for return call, if needed - if no Cardiologist f/u has been made] RE: Holter Monitor results and further provider instructions regarding being set-up with Cardiologist to manage symptoms/SLS             Brunetta Jeans, PA-C at 08/09/2014 3:58 PM     Status: Signed       Expand All Collapse All   Just received her Holter Monitor from [facility] revealing normal sinus rhythm overall but a large amount of premature ventricular contractions and an episode of Ventricular tachycardia. Giving these findings, I am assuming she was set up to see a Cardiologist at that practice. Please verify that the patient is being managed by these guys as there is no mention of this in the note

## 2014-08-27 ENCOUNTER — Encounter: Payer: Self-pay | Admitting: *Deleted

## 2014-08-27 NOTE — Telephone Encounter (Signed)
Letter Mailed to patient/SLS

## 2014-09-10 HISTORY — PX: CARDIAC VALVE SURGERY: SHX40

## 2014-11-12 ENCOUNTER — Other Ambulatory Visit: Payer: Self-pay | Admitting: Physician Assistant

## 2014-11-12 NOTE — Telephone Encounter (Signed)
Left message for patient to return my call.

## 2014-11-12 NOTE — Telephone Encounter (Signed)
Medication Detail      Disp Refills Start End     citalopram (CELEXA) 20 MG tablet 30 tablet 4 06/13/2014 06/14/2015    Sig - Route: Take 1 tablet (20 mg total) by mouth at bedtime. - Oral    E-Prescribing Status: Receipt confirmed by pharmacy (06/13/2014 2:58 PM EDT)    Medication Detail      Disp Refills Start End     divalproex (DEPAKOTE ER) 500 MG 24 hr tablet 90 tablet 4 06/13/2014     Sig: Take 1 tablet daily x 3 days. Then increase to 1 tablet twice daily x 3 days. Then increase to 1 tablet three times daily.    Class: Print     Associated Diagnoses    Bipolar disorder, mixed - Primary       Per provider VO, patient given 10-day supply Only; patient was due for follow-up in October 2015 [new pt]/SLS Please call patient and inform of provider instruction of overdue for Follow-up and schedule appt; thanks.

## 2014-11-13 NOTE — Telephone Encounter (Signed)
Left message for patient to return my call.

## 2014-12-09 ENCOUNTER — Telehealth: Payer: Self-pay | Admitting: *Deleted

## 2014-12-09 ENCOUNTER — Ambulatory Visit: Payer: 59 | Admitting: Physician Assistant

## 2014-12-09 NOTE — Telephone Encounter (Signed)
FYI: LMOM with contact name and number for return call RE: R/S 02.29.16 appointment/SLS

## 2014-12-11 ENCOUNTER — Ambulatory Visit (INDEPENDENT_AMBULATORY_CARE_PROVIDER_SITE_OTHER): Payer: 59 | Admitting: Physician Assistant

## 2014-12-11 ENCOUNTER — Encounter: Payer: Self-pay | Admitting: Physician Assistant

## 2014-12-11 VITALS — BP 105/61 | HR 84 | Temp 98.2°F | Resp 16 | Ht 74.5 in | Wt 160.2 lb

## 2014-12-11 DIAGNOSIS — Z30013 Encounter for initial prescription of injectable contraceptive: Secondary | ICD-10-CM

## 2014-12-11 DIAGNOSIS — G4700 Insomnia, unspecified: Secondary | ICD-10-CM

## 2014-12-11 MED ORDER — ESZOPICLONE 2 MG PO TABS: 2.0000 mg | ORAL_TABLET | Freq: Every evening | ORAL | Status: DC | PRN

## 2014-12-11 MED ORDER — MEDROXYPROGESTERONE ACETATE 150 MG/ML IM SUSP
150.0000 mg | Freq: Once | INTRAMUSCULAR | Status: AC
  Administered 2014-12-11: 150 mg via INTRAMUSCULAR

## 2014-12-11 NOTE — Progress Notes (Signed)
Patient presents to clinic today c/o insomnia that has been severe over the past 2 months.  Endorses taking 5-6 hours to fall asleep.  Once asleep she does sleep well but is only averaging a couple of hours of sleep per night. Is affecting her daily life.  Was previously on Lunesta for a brief time with good improvement.  Patient has been taking Melatonin and Benadryl without relief of symptoms.  Patient would also like to restart Depo Provera.  This was discussed at last visit, but unfortunately patient had unexpected cardaic health issues relating to her Marfan's that prevented her for returning for shot.  Past Medical History  Diagnosis Date  . Marfan's syndrome     Dr. Atilano Median  . Palpitations   . Mitral valve prolapse   . Ascending aorta dilatation   . Depression   . Anxiety   . Varicose veins   . Migraine   . Suicidal behavior     Tylenol Overdose    Current Outpatient Prescriptions on File Prior to Visit  Medication Sig Dispense Refill  . citalopram (CELEXA) 20 MG tablet take 1 tablet by mouth at bedtime 10 tablet 0  . divalproex (DEPAKOTE ER) 500 MG 24 hr tablet Take 1 tablet (500 mg total) by mouth 3 (three) times daily. 30 tablet 0   No current facility-administered medications on file prior to visit.    No Known Allergies  Family History  Problem Relation Age of Onset  . Cancer Mother 59    Breast   . Marfan syndrome Father   . Heart disease Father   . Marfan syndrome Sister   . Marfan syndrome Paternal Grandmother   . Marfan syndrome Sister     History   Social History  . Marital Status: Divorced    Spouse Name: N/A  . Number of Children: N/A  . Years of Education: 16   Occupational History  . RETENTION MANAGER      [employer]   Social History Main Topics  . Smoking status: Former Smoker -- 2.00 packs/day for 18 years    Types: Cigarettes    Quit date: 04/10/2013  . Smokeless tobacco: Current User     Comment: Currently using e-cigarrettes to  help her quit smoking   . Alcohol Use: No  . Drug Use: No  . Sexual Activity: Not Currently   Other Topics Concern  . 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  She stopped smoking cigarettes in 04/2013 and started using e-cigarettes to help her quit.  She has weaned herself down on the nicotine.     Alcohol Use:  None   Drug Use:  None   Diet:  Regular   Exercise:  None   Hobbies:  Reading, Traveling                Review of Systems - See HPI.  All other ROS are negative.  BP 105/61 mmHg  Pulse 84  Temp(Src) 98.2 F (36.8 C) (Oral)  Resp 16  Ht 6' 2.5" (1.892 m)  Wt 160 lb 4 oz (72.689 kg)  BMI 20.31 kg/m2  SpO2 100%  LMP  11/27/2014  Physical Exam  Constitutional: She is oriented to person, place, and time and well-developed, well-nourished, and in no distress.  HENT:  Head: Normocephalic and atraumatic.  Cardiovascular: Normal rate, regular rhythm, normal heart sounds and intact distal pulses.   Pulmonary/Chest: Effort normal and breath sounds normal. No respiratory distress. She has no wheezes. She has no rales. She exhibits no tenderness.  Neurological: She is alert and oriented to person, place, and time.  Skin: Skin is warm and dry. No rash noted.  Psychiatric: Affect normal.  Vitals reviewed.  Assessment/Plan: Encounter for initial prescription of injectable contraceptive Urine pregnancy negative.  Depo Provera injection given by nursing. Injection Schedule given to patient.   Insomnia Will restart Lunesta at 2 mg nightly -- for short-term use.  Will follow-up in 1 month.

## 2014-12-11 NOTE — Patient Instructions (Signed)
Please take the Lunesta as directed.  Increase evening exercise as this will help promote restful sleep. Follow-up in 1 month.  Your next Depo Provera shot will be due somewhere between May 18th and June 1st.

## 2014-12-11 NOTE — Progress Notes (Signed)
Pre visit review using our clinic review tool, if applicable. No additional management support is needed unless otherwise documented below in the visit note/SLS  

## 2014-12-12 ENCOUNTER — Telehealth: Payer: Self-pay | Admitting: Physician Assistant

## 2014-12-12 DIAGNOSIS — Z30013 Encounter for initial prescription of injectable contraceptive: Secondary | ICD-10-CM | POA: Insufficient documentation

## 2014-12-12 DIAGNOSIS — G4700 Insomnia, unspecified: Secondary | ICD-10-CM | POA: Insufficient documentation

## 2014-12-12 DIAGNOSIS — Z5181 Encounter for therapeutic drug level monitoring: Secondary | ICD-10-CM

## 2014-12-12 NOTE — Telephone Encounter (Signed)
Caller name: Chelsae Relation to pt: self Call back number: [phone] Pharmacy: [facility] [address] in [place]  Reason for call:   Requesting refill of depakote and celexa

## 2014-12-12 NOTE — Assessment & Plan Note (Signed)
Will restart Lunesta at 2 mg nightly -- for short-term use.  Will follow-up in 1 month.

## 2014-12-12 NOTE — Assessment & Plan Note (Signed)
Urine pregnancy negative.  Depo Provera injection given by nursing. Injection Schedule given to patient.

## 2014-12-13 MED ORDER — DIVALPROEX SODIUM ER 500 MG PO TB24: 500.0000 mg | ORAL_TABLET | Freq: Three times a day (TID) | ORAL | Status: DC

## 2014-12-13 MED ORDER — CITALOPRAM HYDROBROMIDE 20 MG PO TABS: 20.0000 mg | ORAL_TABLET | Freq: Every day | ORAL | Status: DC

## 2014-12-13 NOTE — Telephone Encounter (Signed)
Lab appointment scheduled for 12/16/2014

## 2014-12-13 NOTE — Telephone Encounter (Signed)
Rx request to pharmacy/SLS  Please call patient and arrange Lab appointment for Depakote level check per provider/SLS Thanks.

## 2014-12-16 ENCOUNTER — Other Ambulatory Visit (INDEPENDENT_AMBULATORY_CARE_PROVIDER_SITE_OTHER): Payer: 59

## 2014-12-16 DIAGNOSIS — Z5181 Encounter for therapeutic drug level monitoring: Secondary | ICD-10-CM

## 2014-12-17 LAB — VALPROIC ACID LEVEL: VALPROIC ACID LVL: 71.2 ug/mL (ref 50.0–100.0)

## 2015-01-20 ENCOUNTER — Telehealth: Payer: Self-pay | Admitting: Physician Assistant

## 2015-01-20 MED ORDER — CITALOPRAM HYDROBROMIDE 20 MG PO TABS: 20.0000 mg | ORAL_TABLET | Freq: Every day | ORAL | Status: DC

## 2015-01-20 NOTE — Telephone Encounter (Signed)
Caller name: Taleyah Relation to pt: self Call back number: [phone] Pharmacy: [facility] [address]  Reason for call:   Requesting depakote and celaxa refill

## 2015-01-20 NOTE — Telephone Encounter (Signed)
LMOM with contact name and number for return call RE: dosage and daily instructions on Depakote per provider/SLS

## 2015-01-20 NOTE — Telephone Encounter (Signed)
Citalopram refilled.  Please check if Depakote is ER or regular.  Verify dosing with patient.

## 2015-01-21 MED ORDER — DIVALPROEX SODIUM ER 500 MG PO TB24: 500.0000 mg | ORAL_TABLET | Freq: Three times a day (TID) | ORAL | Status: DC

## 2015-01-21 NOTE — Telephone Encounter (Signed)
Rx sent 

## 2015-01-21 NOTE — Telephone Encounter (Signed)
Patient returned phone call. [facility] # [phone] or [phone]

## 2015-01-21 NOTE — Telephone Encounter (Signed)
Pt taking depakote ER 500mg  TID. One -morning ,afternoon and evening.

## 2015-02-20 ENCOUNTER — Telehealth: Payer: Self-pay | Admitting: Physician Assistant

## 2015-02-20 MED ORDER — DIVALPROEX SODIUM ER 500 MG PO TB24: 500.0000 mg | ORAL_TABLET | Freq: Three times a day (TID) | ORAL | Status: DC

## 2015-02-20 NOTE — Telephone Encounter (Signed)
Noted  

## 2015-02-20 NOTE — Telephone Encounter (Signed)
Patient Name: Melissa Hayden DOB: May 02, 1976 Initial Comment caller states left arm and left foot numbness for about a week now Nurse Assessment Nurse: Marcelline Deist, RN, Kermit Balo Date/Time (Eastern Time): 02/20/2015 11:39:22 AM Confirm and document reason for call. If symptomatic, describe symptoms. ---Caller states her entire left arm and left foot have been having numbness several times a day for a few minutes for about a week now. Her left collarbone pops when she moves her left arm & is very painful at the time. Her foot gets swollen at times, too. No falls or injuries. Spoke with her Dr. about Warfarin that she is on for heart surgery, artificial valve replacement in Dec. Her Dr. didn't think it was related. Has the patient traveled out of the country within the last 30 days? ---Not Applicable Does the patient require triage? ---Yes Related visit to physician within the last 2 weeks? ---No Does the PT have any chronic conditions? (i.e. diabetes, asthma, etc.) ---Yes List chronic conditions. ---heart/valve syndrome, Marfan's syndrome Did the patient indicate they were pregnant? ---No Guidelines Guideline Title Affirmed Question Affirmed Notes Chest Injury - Bending Lifting or Twisting [1] High-risk adult (e.g., age > 60, osteoporosis, chronic steroid use) AND [2] still hurts Final Disposition User See Physician within [place], RN, [employer] during triage mentioned that her collarbone pain is constant. Triaged for that as she is not having the numbness on left arm or foot currently. She feels the collarbone popping/pain is related to her arm going numb. Discussed her symptoms with her blood thinner Dr. Marland Kitchen she does not feel concerned from the standpoint of the medication she is on. Scheduled for an appt., but advised that if symptoms with numbness become more frequent/continuous, she should be seen sooner.

## 2015-02-20 NOTE — Telephone Encounter (Signed)
Caller: Melissa Hayden, self Ph#: [phone], cell Pharmacy: [facility], [place] Reason for call: Pt needs refill on divalproex (DEPAKOTE ER) 500 MG 24 hr tablet. Taking 3xdaily. Has 2 days of medication remaining. Pt would like to know if she can get RX with refills so she does not have to call in each month.

## 2015-02-20 NOTE — Telephone Encounter (Signed)
Pt called in for med refills and to schedule appt. Pt stated she had heart surgery in December 2015. She stated for the last week or 2 she has been experiencing left arm falling asleep, left collar bone pain, and left foot falling asleep. Transferred call to [facility] with [facility]. No appt scheduled at this time.

## 2015-02-20 NOTE — Telephone Encounter (Signed)
Left msg for pt notifying her RX was sent in with refills.

## 2015-02-20 NOTE — Telephone Encounter (Signed)
Refill sent in - 90-day supply with refill. The prescriptions have been written as 90-day supplies each so she certainly should not have to be calling in every month. Pharmacy should be giving her 53-month supplies at a time.

## 2015-02-21 ENCOUNTER — Ambulatory Visit (INDEPENDENT_AMBULATORY_CARE_PROVIDER_SITE_OTHER): Payer: 59 | Admitting: Physician Assistant

## 2015-02-21 ENCOUNTER — Encounter: Payer: Self-pay | Admitting: Physician Assistant

## 2015-02-21 ENCOUNTER — Ambulatory Visit (HOSPITAL_BASED_OUTPATIENT_CLINIC_OR_DEPARTMENT_OTHER)
Admission: RE | Admit: 2015-02-21 | Discharge: 2015-02-21 | Disposition: A | Discharge status: Home / Self Care | Payer: 59 | Source: Ambulatory Visit | Attending: Physician Assistant | Admitting: Physician Assistant

## 2015-02-21 VITALS — BP 120/59 | HR 103 | Temp 98.0°F | Wt 165.0 lb

## 2015-02-21 DIAGNOSIS — M25512 Pain in left shoulder: Secondary | ICD-10-CM | POA: Diagnosis not present

## 2015-02-21 MED ORDER — HYDROCODONE-ACETAMINOPHEN 10-325 MG PO TABS: 1.0000 | ORAL_TABLET | Freq: Two times a day (BID) | ORAL | Status: DC

## 2015-02-21 MED ORDER — METHYLPREDNISOLONE 4 MG PO TBPK: ORAL_TABLET | ORAL | Status: DC

## 2015-02-21 NOTE — Patient Instructions (Signed)
Please go downstairs for imaging.  We will call you with your results. Take the steroid pack daily as directed. Use the Norco twice daily for breakthrough pain.  Follow-up will be based on results.  May need an Orthopedist input based on symptoms improvement.

## 2015-02-21 NOTE — Progress Notes (Signed)
Pre visit review using our clinic review tool, if applicable. No additional management support is needed unless otherwise documented below in the visit note. 

## 2015-02-21 NOTE — Progress Notes (Signed)
Patient presents to clinic today c/o 1.5 weeks of numbness, tingling of L upper extremity associated with pain.  States is associated with L collarbone pain.  Denies trauma or injury.  Denies prior history of neck injury.  Pain occurs with reaching or ROM of left shoulder.  Does have Marfan syndrome.  Has also been having rare headaches over the same time course.  Denies lightheadedness, dizziness or vision changes. Has been taking ES tylenol for symptoms with little relief.  Past Medical History  Diagnosis Date  . Marfan's syndrome     Dr. Atilano Median  . Palpitations   . Mitral valve prolapse   . Ascending aorta dilatation   . Depression   . Anxiety   . Varicose veins   . Migraine   . Suicidal behavior     Tylenol Overdose    Current Outpatient Prescriptions on File Prior to Visit  Medication Sig Dispense Refill  . citalopram (CELEXA) 20 MG tablet Take 1 tablet (20 mg total) by mouth at bedtime. 90 tablet 1  . divalproex (DEPAKOTE ER) 500 MG 24 hr tablet Take 1 tablet (500 mg total) by mouth 3 (three) times daily. 90 tablet 1  . eszopiclone (LUNESTA) 2 MG TABS tablet Take 1 tablet (2 mg total) by mouth at bedtime as needed for sleep. Take immediately before bedtime (Patient not taking: Reported on 02/21/2015) 30 tablet 0   No current facility-administered medications on file prior to visit.    No Known Allergies  Family History  Problem Relation Age of Onset  . Cancer Mother 23    Breast   . Marfan syndrome Father   . Heart disease Father   . Marfan syndrome Sister   . Marfan syndrome Paternal Grandmother   . Marfan syndrome Sister    History   Social History  . Marital Status: Divorced    Spouse Name: N/A  . Number of Children: N/A  . Years of Education: 16   Occupational History  . RETENTION MANAGER      [employer]   Social History Main Topics  . Smoking status: Former Smoker -- 2.00 packs/day for 18 years    Types: Cigarettes    Quit date: 04/10/2013  .  Smokeless tobacco: Current User     Comment: Currently using e-cigarrettes to help her quit smoking   . Alcohol Use: No  . Drug Use: No  . Sexual Activity: Not Currently   Other Topics Concern  . 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  She stopped smoking cigarettes in 04/2013 and started using e-cigarettes to help her quit.  She has weaned herself down on the nicotine.     Alcohol Use:  None   Drug Use:  None   Diet:  Regular   Exercise:  None   Hobbies:  Reading, Traveling                Review of Systems - See HPI.  All other ROS are negative.  BP 120/59 mmHg  Pulse 103  Temp(Src) 98 F (36.7 C)  Wt 165 lb (74.844 kg)  SpO2 100%  Physical Exam  Constitutional:  She is oriented to person, place, and time and well-developed, well-nourished, and in no distress.  HENT:  Head: Normocephalic and atraumatic.  Cardiovascular: Normal rate, regular rhythm, normal heart sounds and intact distal pulses.   Pulmonary/Chest: Effort normal and breath sounds normal. No respiratory distress. She has no wheezes. She has no rales. She exhibits no tenderness.  Musculoskeletal:       Right shoulder: Normal.       Left shoulder: She exhibits tenderness, bony tenderness and pain. She exhibits normal range of motion and normal strength.       Cervical back: Normal.  Pain noted with palpation over clavicle.  There is some pectus carinatum on exam, coinciding with marfan.  Neurological: She is alert and oriented to person, place, and time.  Skin: Skin is warm and dry. No rash noted.  Psychiatric: Affect normal.  Vitals reviewed.   Recent Results (from the past 2160 hour(s))  Valproic Acid level      Status: None   Collection Time: 12/16/14  4:21 PM  Result Value Ref Range   Valproic Acid Lvl 71.2 50.0 - 100.0 ug/mL    Assessment/Plan: Left shoulder pain Will obtain DG left shoulder, cervical spine and left clavicle to further assess.  Some MSK components but also some components of nerve irritation.  Patient on coumadin.  Will begin Medrol pack. Norco for severe pain but dosing limitations discussed.  Supportive measures discussed.  Will alter regimens based on findings.

## 2015-02-21 NOTE — Assessment & Plan Note (Signed)
Will obtain DG left shoulder, cervical spine and left clavicle to further assess.  Some MSK components but also some components of nerve irritation.  Patient on coumadin.  Will begin Medrol pack. Norco for severe pain but dosing limitations discussed.  Supportive measures discussed.  Will alter regimens based on findings.

## 2015-03-05 ENCOUNTER — Ambulatory Visit: Payer: 59

## 2015-03-25 ENCOUNTER — Encounter: Payer: Self-pay | Admitting: Physician Assistant

## 2015-03-25 ENCOUNTER — Ambulatory Visit: Payer: 59 | Admitting: Physician Assistant

## 2015-03-25 ENCOUNTER — Ambulatory Visit (INDEPENDENT_AMBULATORY_CARE_PROVIDER_SITE_OTHER): Payer: 59 | Admitting: Physician Assistant

## 2015-03-25 VITALS — BP 102/74 | HR 99 | Temp 98.9°F | Resp 16 | Ht 74.5 in | Wt 169.2 lb

## 2015-03-25 DIAGNOSIS — Z308 Encounter for other contraceptive management: Secondary | ICD-10-CM

## 2015-03-25 DIAGNOSIS — M940 Chondrocostal junction syndrome [Tietze]: Secondary | ICD-10-CM

## 2015-03-25 DIAGNOSIS — Q8740 Marfan's syndrome, unspecified: Secondary | ICD-10-CM | POA: Diagnosis not present

## 2015-03-25 LAB — POCT URINE PREGNANCY: Preg Test, Ur: NEGATIVE

## 2015-03-25 MED ORDER — MEDROXYPROGESTERONE ACETATE 150 MG/ML IM SUSP
150.0000 mg | Freq: Once | INTRAMUSCULAR | Status: AC
  Administered 2015-03-25: 150 mg via INTRAMUSCULAR

## 2015-03-25 MED ORDER — HYDROCODONE-ACETAMINOPHEN 10-325 MG PO TABS: 1.0000 | ORAL_TABLET | Freq: Two times a day (BID) | ORAL | Status: DC

## 2015-03-25 NOTE — Addendum Note (Signed)
Addended by: Rockwell Germany on: 03/25/2015 02:52 PM   Modules accepted: Orders

## 2015-03-25 NOTE — Assessment & Plan Note (Signed)
In patient with Marfan Syndrome and pectus carinatum. Rx Hydrocodone as patient is on anticoagulation.  Supportive measures reviewed. Referral placed to Ortho giving history as symptoms likely to be chronic/recurrent.

## 2015-03-25 NOTE — Patient Instructions (Signed)
Please take the pain medication as directed. Apply topical Aspercreme to the area. Avoid heavy lifting or overexertion. You will be contacted for further evaluation by Orthopedics.

## 2015-03-25 NOTE — Progress Notes (Signed)
Patient presents to clinic today c/o tender knot to the right of sternum first noticed 1 week ago. Denies drainage, fever, chills, redness, warmth.  Endorses tenderness.  Past Medical History  Diagnosis Date  . Marfan's syndrome     Dr. Atilano Median  . Palpitations   . Mitral valve prolapse   . Ascending aorta dilatation   . Depression   . Anxiety   . Varicose veins   . Migraine   . Suicidal behavior     Tylenol Overdose    Current Outpatient Prescriptions on File Prior to Visit  Medication Sig Dispense Refill  . aspirin 81 MG chewable tablet Chew by mouth.    . citalopram (CELEXA) 20 MG tablet Take 1 tablet (20 mg total) by mouth at bedtime. 90 tablet 1  . divalproex (DEPAKOTE ER) 500 MG 24 hr tablet Take 1 tablet (500 mg total) by mouth 3 (three) times daily. 90 tablet 1   No current facility-administered medications on file prior to visit.    No Known Allergies  Family History  Problem Relation Age of Onset  . Cancer Mother 45    Breast   . Marfan syndrome Father   . Heart disease Father   . Marfan syndrome Sister   . Marfan syndrome Paternal Grandmother   . Marfan syndrome Sister     History   Social History  . Marital Status: Divorced    Spouse Name: N/A  . Number of Children: N/A  . Years of Education: 16   Occupational History  . RETENTION MANAGER      [employer]   Social History Main Topics  . Smoking status: Former Smoker -- 2.00 packs/day for 18 years    Types: Cigarettes    Quit date: 04/10/2013  . Smokeless tobacco: Current User     Comment: Currently using e-cigarrettes to help her quit smoking   . Alcohol Use: No  . Drug Use: No  . Sexual Activity: Not Currently   Other Topics Concern  . None   Social History Narrative   Marital Status: Divorced    Children:  None    Pets:  None   Living Situation: Lives with a roommate Associate Professor)    Occupation:  Energy manager)    Education:  Forensic psychologist ([employer])    Tobacco Use/Exposure:  She smoked 2 ppd for 18 years.  She stopped smoking cigarettes in 04/2013 and started using e-cigarettes to help her quit.  She has weaned herself down on the nicotine.     Alcohol Use:  None   Drug Use:  None   Diet:  Regular   Exercise:  None   Hobbies:  Reading, Traveling                Review of Systems - See HPI.  All other ROS are negative.  BP 102/74 mmHg  Pulse 99  Temp(Src) 98.9 F (37.2 C) (Oral)  Resp 16  Ht 6' 2.5" (1.892 m)  Wt 169 lb 4 oz (76.771 kg)  BMI 21.45 kg/m2  SpO2 97%  Physical Exam  Constitutional: She is oriented to person, place, and time and well-developed, well-nourished, and in no distress.  HENT:  Head: Normocephalic and atraumatic.  Eyes: Conjunctivae are normal.  Cardiovascular: Normal rate, regular rhythm, normal heart sounds and intact distal pulses.   Musculoskeletal:       Arms: Neurological: She is alert and oriented to person, place, and time.  Skin: Skin is warm and dry. No rash noted.  Psychiatric: Affect normal.  Vitals reviewed.  Assessment/Plan: Costochondritis In patient with Marfan Syndrome and pectus carinatum. Rx Hydrocodone as patient is on anticoagulation.  Supportive measures reviewed. Referral placed to Ortho giving history as symptoms likely to be chronic/recurrent.

## 2015-03-25 NOTE — Progress Notes (Signed)
Pre visit review using our clinic review tool, if applicable. No additional management support is needed unless otherwise documented below in the visit note/SLS  

## 2015-03-25 NOTE — Addendum Note (Signed)
Addended by: Rockwell Germany on: 03/25/2015 04:57 PM   Modules accepted: Orders

## 2015-04-21 ENCOUNTER — Encounter: Payer: Self-pay | Admitting: Physician Assistant

## 2015-04-21 ENCOUNTER — Other Ambulatory Visit: Payer: Self-pay | Admitting: Physician Assistant

## 2015-04-21 ENCOUNTER — Ambulatory Visit (INDEPENDENT_AMBULATORY_CARE_PROVIDER_SITE_OTHER): Payer: 59 | Admitting: Physician Assistant

## 2015-04-21 VITALS — BP 119/69 | HR 89 | Temp 98.4°F | Ht 74.5 in | Wt 168.8 lb

## 2015-04-21 DIAGNOSIS — H60392 Other infective otitis externa, left ear: Secondary | ICD-10-CM

## 2015-04-21 DIAGNOSIS — H65192 Other acute nonsuppurative otitis media, left ear: Secondary | ICD-10-CM

## 2015-04-21 MED ORDER — AMOXICILLIN 500 MG PO CAPS: 500.0000 mg | ORAL_CAPSULE | Freq: Three times a day (TID) | ORAL | Status: DC

## 2015-04-21 MED ORDER — OFLOXACIN 0.3 % OT SOLN: OTIC | Status: DC

## 2015-04-21 NOTE — Patient Instructions (Signed)
Please take antibiotics as directed with food. Stay well hydrated. Continue Allegra and start an over-the-counter Flonase daily as directed for allergies. Follow-up if symptoms are not resolving.

## 2015-04-21 NOTE — Progress Notes (Signed)
Patient presents to clinic today c/o L ear pain over the past 5 days that has been worsening. Now with left-sided lymph node swelling over the past few days. Denies fever, chills, cough or chest congestions. Endorses mild nasal congestion and sore throat. Is taking Allegra daily.  Past Medical History  Diagnosis Date  . Marfan's syndrome     Dr. Atilano Median  . Palpitations   . Mitral valve prolapse   . Ascending aorta dilatation   . Depression   . Anxiety   . Varicose veins   . Migraine   . Suicidal behavior     Tylenol Overdose    Current Outpatient Prescriptions on File Prior to Visit  Medication Sig Dispense Refill  . aspirin 81 MG chewable tablet Chew by mouth.    . citalopram (CELEXA) 20 MG tablet Take 1 tablet (20 mg total) by mouth at bedtime. 90 tablet 1  . warfarin (COUMADIN) 5 MG tablet Take 5 mg by mouth as directed.    Marland Kitchen HYDROcodone-acetaminophen (NORCO) 10-325 MG per tablet Take 1 tablet by mouth every 12 (twelve) hours. (Patient not taking: Reported on 04/21/2015) 30 tablet 0   No current facility-administered medications on file prior to visit.    No Known Allergies  Family History  Problem Relation Age of Onset  . Cancer Mother 41    Breast   . Marfan syndrome Father   . Heart disease Father   . Marfan syndrome Sister   . Marfan syndrome Paternal Grandmother   . Marfan syndrome Sister     History   Social History  . Marital Status: Divorced    Spouse Name: N/A  . Number of Children: N/A  . Years of Education: 16   Occupational History  . RETENTION MANAGER      [employer]   Social History Main Topics  . Smoking status: Former Smoker -- 2.00 packs/day for 18 years    Types: Cigarettes    Quit date: 04/10/2013  . Smokeless tobacco: Current User     Comment: Currently using e-cigarrettes to help her quit smoking   . Alcohol Use: No  . Drug Use: No  . Sexual Activity: Not Currently   Other Topics Concern  . None   Social History Narrative     Marital Status: Divorced    Children:  None    Pets:  None   Living Situation: Lives with a roommate Associate Professor)    Occupation:  Energy manager)    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  She stopped smoking cigarettes in 04/2013 and started using e-cigarettes to help her quit.  She has weaned herself down on the nicotine.     Alcohol Use:  None   Drug Use:  None   Diet:  Regular   Exercise:  None   Hobbies:  Reading, Traveling                Review of Systems - See HPI.  All other ROS are negative.  BP 119/69 mmHg  Temp(Src) 98.4 F (36.9 C) (Oral)  Ht 6' 2.5" (1.892 m)  Wt 168 lb 12.8 oz (76.567 kg)  BMI 21.39 kg/m2  SpO2 98%  Physical Exam  Constitutional: She is oriented to person, place, and time and well-developed, well-nourished, and in no distress.  HENT:  Head: Normocephalic and atraumatic.  Right Ear: Tympanic membrane, external ear and  ear canal normal.  Left Ear: External ear normal. Tympanic membrane is erythematous. A middle ear effusion is present.  Ear canal erythematous and swollen  Cardiovascular: Normal rate, regular rhythm, normal heart sounds and intact distal pulses.   Pulmonary/Chest: Effort normal and breath sounds normal. No respiratory distress. She has no wheezes. She has no rales. She exhibits no tenderness.  Neurological: She is alert and oriented to person, place, and time.  Skin: Skin is warm and dry. No rash noted.  Psychiatric: Affect normal.  Vitals reviewed.   Recent Results (from the past 2160 hour(s))  POCT urine pregnancy     Status: Normal   Collection Time: 03/25/15  4:54 PM  Result Value Ref Range   Preg Test, Ur Negative Negative    Assessment/Plan: Acute nonsuppurative otitis media of left ear With noted AOM. Rx Amoxicillin and Ofloxacin otic. Supportive measures reviewed. Continue allegra and begin Flonase. Follow-up  PRN.

## 2015-04-21 NOTE — Progress Notes (Signed)
Pre visit review using our clinic review tool, if applicable. No additional management support is needed unless otherwise documented below in the visit note. 

## 2015-04-21 NOTE — Assessment & Plan Note (Signed)
With noted AOM. Rx Amoxicillin and Ofloxacin otic. Supportive measures reviewed. Continue allegra and begin Flonase. Follow-up PRN.

## 2015-05-19 ENCOUNTER — Telehealth: Payer: Self-pay | Admitting: Physician Assistant

## 2015-05-19 DIAGNOSIS — Q8743 Marfan's syndrome with skeletal manifestation: Secondary | ICD-10-CM

## 2015-05-19 NOTE — Telephone Encounter (Signed)
I had previously placed a referral to Orthopedics for this issue but they canceled the referral after leaving her messages without response. I will place another referral but I encourage her to keep her phone on so an appointment can be scheduled.

## 2015-05-19 NOTE — Telephone Encounter (Signed)
Please advise.//AB/CMA 

## 2015-05-19 NOTE — Telephone Encounter (Signed)
Caller name: Varshini Arrants   Relationship to patient: Self   Can be reached: [phone] (cell)  Pharmacy:  Reason for call: pt says that she need a referral for her pain that was mentioned in her visit. She said that she is now having more pain. Could you refer her?

## 2015-06-12 ENCOUNTER — Telehealth: Payer: Self-pay | Admitting: Physician Assistant

## 2015-06-12 NOTE — Telephone Encounter (Signed)
°  Relation to [facility]:OH[facility] Call back [place]:  Reason for call: pt states she went to her orthopedic appt today, would like to know from [place] if there is any non-narcotic med she can take to help her with her pain, states with all the other health issues she can't take a lot of pain meds, right now she states she is taking 12 arthritis tylenol daily and it is not helping with the pain.

## 2015-06-12 NOTE — Telephone Encounter (Signed)
Unfortunately we are limited due to her being on anticoagulant (warfarin) as this removes any prescription anti-inflammatory pain relievers. The amount of tylenol she is taking is way too much. Could always try something less habit forming like an extended release Tramadol but there are not many true non-narcotic pain medications as an option for her. Can consider adding on something like Cymbalta to help with chronic musculoskeletal pain, but would have to be careful with dose due to Celexa and Depakote. Could possibly swab Celexa for the Cymbalta to help with both mood and pain. Did her orthopedist office any suggestions?

## 2015-06-13 NOTE — Telephone Encounter (Signed)
LMOM with contact name and number for return call RE: medication inquiry with reply from provider/SLS

## 2015-06-17 MED ORDER — DULOXETINE HCL 30 MG PO CPEP: 30.0000 mg | ORAL_CAPSULE | Freq: Every day | ORAL | Status: DC

## 2015-06-17 MED ORDER — TRAMADOL HCL 50 MG PO TABS
50.0000 mg | ORAL_TABLET | Freq: Two times a day (BID) | ORAL | Status: DC | PRN
Start: 2015-06-17 — End: 2015-10-11

## 2015-06-17 NOTE — Telephone Encounter (Signed)
Patient returning your call please call her [phone]

## 2015-06-17 NOTE — Telephone Encounter (Signed)
Cymbalta Rx sent to pharmacy. Rx Tramadol printed and signed. Needs to be faxed in. Use sparingly. For the Cymbalta she is to switch out the Celexa and take Cymbalta in place of starting tomorrow. Follow-up 3 weeks.

## 2015-06-17 NOTE — Telephone Encounter (Signed)
Patient has MRI scheduled on 09.12.16, but does not have ROV until 09.19.16; patient would like to move forward with Tramadol and Cymbalta [in place of Celexa]; will call pt back when Rx have been forwarded to pharmacy/SLS

## 2015-06-17 NOTE — Telephone Encounter (Signed)
Rx request faxed to pharmacy [Tramadol], [facility] with contact name and number for return call RE: scheduling 3-wk F/U and new medication instructoins per provider instructions/SLS

## 2015-07-07 ENCOUNTER — Encounter: Payer: Self-pay | Admitting: Physician Assistant

## 2015-07-07 ENCOUNTER — Ambulatory Visit (INDEPENDENT_AMBULATORY_CARE_PROVIDER_SITE_OTHER): Payer: 59 | Admitting: Physician Assistant

## 2015-07-07 VITALS — BP 108/68 | HR 84 | Temp 98.3°F | Resp 16 | Ht 74.5 in | Wt 167.1 lb

## 2015-07-07 DIAGNOSIS — H60392 Other infective otitis externa, left ear: Secondary | ICD-10-CM | POA: Diagnosis not present

## 2015-07-07 DIAGNOSIS — H60399 Other infective otitis externa, unspecified ear: Secondary | ICD-10-CM | POA: Insufficient documentation

## 2015-07-07 MED ORDER — ANTIPYRINE-BENZOCAINE 5.4-1.4 % OT SOLN: 3.0000 [drp] | OTIC | Status: DC | PRN

## 2015-07-07 MED ORDER — CIPROFLOXACIN-DEXAMETHASONE 0.3-0.1 % OT SUSP: OTIC | Status: DC

## 2015-07-07 NOTE — Progress Notes (Signed)
Patient presents to clinic today c/o 2 days of significant left ear pain with purulent drainage. Denies cough, nasal congestion. Notes slight sore throat. Denies fever, chills.  Past Medical History  Diagnosis Date  . Marfan's syndrome     Dr. Atilano Median  . Palpitations   . Mitral valve prolapse   . Ascending aorta dilatation   . Depression   . Anxiety   . Varicose veins   . Migraine   . Suicidal behavior     Tylenol Overdose    Current Outpatient Prescriptions on File Prior to Visit  Medication Sig Dispense Refill  . aspirin 81 MG chewable tablet Chew by mouth.    . divalproex (DEPAKOTE ER) 500 MG 24 hr tablet take 1 tablet by mouth three times a day 90 tablet 5  . DULoxetine (CYMBALTA) 30 MG capsule Take 1 capsule (30 mg total) by mouth daily. 30 capsule 3  . OVER THE COUNTER MEDICATION Multivitamin/Iron/Folic Acid (Centrum Ultra Women's Oral)-Take 1 tablet by mouth daily.    . traMADol (ULTRAM) 50 MG tablet Take 1 tablet (50 mg total) by mouth every 12 (twelve) hours as needed. 60 tablet 0  . warfarin (COUMADIN) 5 MG tablet Take 5 mg by mouth as directed.     No current facility-administered medications on file prior to visit.    No Known Allergies  Family History  Problem Relation Age of Onset  . Cancer Mother 62    Breast   . Marfan syndrome Father   . Heart disease Father   . Marfan syndrome Sister   . Marfan syndrome Paternal Grandmother   . Marfan syndrome Sister     Social History   Social History  . Marital Status: Divorced    Spouse Name: N/A  . Number of Children: N/A  . Years of Education: 16   Occupational History  . RETENTION MANAGER      [employer]   Social History Main Topics  . Smoking status: Former Smoker -- 2.00 packs/day for 18 years    Types: Cigarettes    Quit date: 04/10/2013  . Smokeless tobacco: Current User     Comment: Currently using e-cigarrettes to help her quit smoking   . Alcohol Use: No  . Drug Use: No  . Sexual  Activity: Not Currently   Other Topics Concern  . 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  She stopped smoking cigarettes in 04/2013 and started using e-cigarettes to help her quit.  She has weaned herself down on the nicotine.     Alcohol Use:  None   Drug Use:  None   Diet:  Regular   Exercise:  None   Hobbies:  Reading, Traveling                Review of Systems - See HPI.  All other ROS are negative.  BP 108/68 mmHg  Pulse 84  Temp(Src) 98.3 F (36.8 C) (Oral)  Resp 16  Ht 6' 2.5" (1.892 m)  Wt 167 lb 2 oz (75.807 kg)  BMI 21.18 kg/m2  SpO2 97%  Physical Exam  Constitutional: She is well-developed, well-nourished, and in  no distress.  HENT:  Head: Normocephalic and atraumatic.  Right Ear: Tympanic membrane, external ear and ear canal normal.  Left Ear: Tympanic membrane normal. There is drainage and swelling.  Eyes: Conjunctivae are normal.  Neck: Neck supple.  Cardiovascular: Normal rate, regular rhythm, normal heart sounds and intact distal pulses.   Pulmonary/Chest: Effort normal and breath sounds normal. No respiratory distress. She has no wheezes. She has no rales. She exhibits no tenderness.  Lymphadenopathy:    She has no cervical adenopathy.  Vitals reviewed.   No results found for this or any previous visit (from the past 2160 hour(s)).  Assessment/Plan: Otitis, externa, infective Rx Ciprodex and Auralgan. Apply as directed. Supportive measures and pain medication reviewed. Follow-up if symptoms are not resolving.

## 2015-07-07 NOTE — Assessment & Plan Note (Signed)
Rx Ciprodex and Auralgan. Apply as directed. Supportive measures and pain medication reviewed. Follow-up if symptoms are not resolving.

## 2015-07-07 NOTE — Progress Notes (Signed)
Pre visit review using our clinic review tool, if applicable. No additional management support is needed unless otherwise documented below in the visit note/SLS  

## 2015-07-07 NOTE — Patient Instructions (Signed)
Please use the antibiotic and pain-relieving ear drops as directed. Wear cotton in the ears while showering. Tylenol as needed for pain. Symptoms should begin improving over 24-48 hours. Call if symptoms are not resolving.

## 2015-07-08 ENCOUNTER — Other Ambulatory Visit: Payer: Self-pay | Admitting: *Deleted

## 2015-07-08 NOTE — Progress Notes (Signed)
Auralgan taken off the market, pharmacy informed no change per Provider VO, pt to use Ciprodex; LMOM with contact name and number [for return call, if needed] RE: medication and further provider instructions/SLS

## 2015-07-14 ENCOUNTER — Other Ambulatory Visit: Payer: Self-pay | Admitting: Physician Assistant

## 2015-08-20 ENCOUNTER — Telehealth: Payer: Self-pay | Admitting: Physician Assistant

## 2015-08-20 NOTE — Telephone Encounter (Signed)
Relation to GY:FVCB Call back [phone]   Reason for call:   FYI  Patient scheduled her Depo for 08/21/15

## 2015-08-20 NOTE — Telephone Encounter (Signed)
Please call patient back -- she was due in September so she cannot have until pregnancy test again. She can come in tomorrow for a pregnancy test and then I will determine if they can be restarted. These have be obtained every 3 months on schedule to prevent need for repeat pregnancy test

## 2015-08-21 ENCOUNTER — Ambulatory Visit (INDEPENDENT_AMBULATORY_CARE_PROVIDER_SITE_OTHER): Payer: 59 | Admitting: Behavioral Health

## 2015-08-21 DIAGNOSIS — Z3049 Encounter for surveillance of other contraceptives: Secondary | ICD-10-CM | POA: Diagnosis not present

## 2015-08-21 MED ORDER — MEDROXYPROGESTERONE ACETATE 150 MG/ML IM SUSP
150.0000 mg | Freq: Once | INTRAMUSCULAR | Status: AC
  Administered 2015-08-21: 150 mg via INTRAMUSCULAR

## 2015-08-21 NOTE — Progress Notes (Signed)
Pre visit review using our clinic review tool, if applicable. No additional management support is needed unless otherwise documented below in the visit note.  Patient tolerated injection well.  Next injection scheduled for 11/14/15 at 2:15 PM.

## 2015-08-22 LAB — POCT URINE PREGNANCY: PREG TEST UR: NEGATIVE

## 2015-08-28 ENCOUNTER — Telehealth: Payer: Self-pay | Admitting: Physician Assistant

## 2015-08-28 NOTE — Telephone Encounter (Signed)
[facility] - Client TELEPHONE ADVICE RECORD   [facility]    --------------------------------------------------------------------------------   Patient Name: Melissa Hayden  Gender: Female  DOB: [birth year]   Age: 39 Y 10 M 24 D  Return Phone Number: [phone] (Primary), [phone] (Secondary)  Address:     City/State/Zip:  [place]     Client [facility] - Client  Client Site [facility] - Day  Physician Elyn Aquas   Contact Type Call  Call Type Triage / Clinical  Relationship To Patient Self  Return Phone Number [phone] (Primary)  Chief Complaint Headache  Initial Comment Caller states she received birth control shots. She has headaches and has been menstruating for 3 weeks. She is also on blood thinners.   PreDisposition InappropriateToAsk       Nurse Assessment  Nurse: Amalia Hailey, RN, Melissa Date/Time (Eastern Time): 08/28/2015 3:22:02 PM  Confirm and document reason for call. If symptomatic, describe symptoms. ---Caller states she received birth control shots. She has headaches and has been menstruating for 3 weeks. She is also on blood thinners.    Has the patient traveled out of the country within the last 30 days? ---Not Applicable    Does the patient have any new or worsening symptoms? ---Yes    Will a triage be completed? ---Yes    Related visit to physician within the last 2 weeks? ---Yes    Does the PT have any chronic conditions? (i.e. diabetes, asthma, etc.) ---Yes    List chronic conditions. ---Marfan syndrome, Open Heart Surgery 1 yr ago, Migraines,    Did the patient indicate they were pregnant? ---No    Is this a behavioral health call? ---No           Guidelines          Guideline Title Affirmed Question Affirmed Notes Nurse Date/Time (Eastern Time)  Headache Severe pain in one eye    Evans, RN, Melissa 08/28/2015 3:28:39 PM    Disp. Time Eilene Ghazi Time)  Disposition Final User         08/28/2015 3:31:22 PM Go to ED Now Yes Amalia Hailey, RN, Lenna Sciara            Caller Understands: Yes  Disagree/Comply: Comply       Care Advice Given Per Guideline        GO TO ED NOW: You need to be seen in the Emergency Department. Go to the ER at ___________ [place] now. Drive carefully. DRIVING: Another adult should drive.    After Care Instructions Given        Call Event Type User Date / Time Description        --------------------------------------------------------------------------------            Referrals  [facility] - ED

## 2015-08-28 NOTE — Telephone Encounter (Signed)
Caller name: Todd  Relationship to patient: Self   Can be reached: [phone]   Reason for call: Pt says that she came in the office to get her depo shot on 11/10. She says that she has been bleeding and having headaches for days, well past her usual cycle time frame. Pt would like to discuss this with the provider to see if this is normal. Informed pt that provider is out of the office at this time but I will have call back. ALSO, forwarded pt to [facility] to speak with a nurse further.

## 2015-08-28 NOTE — Telephone Encounter (Signed)
Follow up call placed to patient. No answer. Checking to see if patient did go to ED. Left message for callback from patient.

## 2015-08-29 NOTE — Telephone Encounter (Signed)
The bleeding can occur form the Depo Provera. The headaches are likely due to mild anemia from blood loss. Hydration is key -- increase foods rich in iron. This should be slowing down/stopping. Follow-up in office on Monday if bleeding not resolving.

## 2015-08-29 NOTE — Telephone Encounter (Signed)
Called patient again. Left another message for callback.

## 2015-08-29 NOTE — Telephone Encounter (Signed)
Called patient left message for return call.

## 2015-09-23 ENCOUNTER — Telehealth: Payer: Self-pay | Admitting: Physician Assistant

## 2015-09-23 NOTE — Telephone Encounter (Signed)
LM for pt to call and schedule flu shot or update records. °

## 2015-10-11 ENCOUNTER — Ambulatory Visit (INDEPENDENT_AMBULATORY_CARE_PROVIDER_SITE_OTHER): Payer: 59 | Admitting: Family Medicine

## 2015-10-11 ENCOUNTER — Encounter: Payer: Self-pay | Admitting: Family Medicine

## 2015-10-11 VITALS — BP 100/70 | HR 81 | Temp 98.1°F | Ht 74.5 in | Wt 161.8 lb

## 2015-10-11 DIAGNOSIS — J029 Acute pharyngitis, unspecified: Secondary | ICD-10-CM

## 2015-10-11 DIAGNOSIS — J0110 Acute frontal sinusitis, unspecified: Secondary | ICD-10-CM

## 2015-10-11 DIAGNOSIS — J019 Acute sinusitis, unspecified: Secondary | ICD-10-CM | POA: Insufficient documentation

## 2015-10-11 DIAGNOSIS — Q8740 Marfan's syndrome, unspecified: Secondary | ICD-10-CM | POA: Diagnosis not present

## 2015-10-11 DIAGNOSIS — H6502 Acute serous otitis media, left ear: Secondary | ICD-10-CM

## 2015-10-11 LAB — POCT RAPID STREP A (OFFICE): RAPID STREP A SCREEN: NEGATIVE

## 2015-10-11 MED ORDER — GUAIFENESIN-CODEINE 100-10 MG/5ML PO SYRP: 5.0000 mL | ORAL_SOLUTION | Freq: Two times a day (BID) | ORAL | Status: DC | PRN

## 2015-10-11 MED ORDER — AMOXICILLIN-POT CLAVULANATE 875-125 MG PO TABS
1.0000 | ORAL_TABLET | Freq: Two times a day (BID) | ORAL | Status: AC
Start: 2015-10-11 — End: 2015-10-21

## 2015-10-11 NOTE — Progress Notes (Signed)
Pre visit review using our clinic review tool, if applicable. No additional management support is needed unless otherwise documented below in the visit note. 

## 2015-10-11 NOTE — Progress Notes (Signed)
BP 100/70 mmHg  Pulse 81  Temp(Src) 98.1 F (36.7 C) (Oral)  Ht 6' 2.5" (1.892 m)  Wt 161 lb 12 oz (73.369 kg)  BMI 20.50 kg/m2  SpO2 99%   CC: ST  Subjective:    Patient ID: Melissa Hayden, female    DOB: Oct 27, 1975, 39 y.o.   MRN: [id number]  HPI: Melissa Hayden is a 39 y.o. female presenting on 10/11/2015 for Sore Throat; Cough; and Nasal Congestion   2.5 wk h/o cough, sinus congestion, chest congestion. No improvement. Over last 2-3 days worsening sore throat with swollen glands. Feverish but no fevers or chills. L earache and muffled hearing. + sinus headache. + PNdrainage. Cough more productive at night time.   No chest pain, dyspnea or wheeze.   No improvement with OTC remedies.  + sick contacts at work.  Smoking but not currently due to illness.  No h/o asthma, COPD.  Known marfan's syndrome. December 2015 had aortic valve, aortic root and mitral valve replacement. Lifelong coumadin. Followed by [facility]. Next coumadin check next week.   Relevant past medical, surgical, family and social history reviewed and updated as indicated. Interim medical history since our last visit reviewed. Allergies and medications reviewed and updated. Current Outpatient Prescriptions on File Prior to Visit  Medication Sig  . divalproex (DEPAKOTE ER) 500 MG 24 hr tablet take 1 tablet by mouth three times a day  . DULoxetine (CYMBALTA) 30 MG capsule Take 1 capsule (30 mg total) by mouth daily.  Marland Kitchen OVER THE COUNTER MEDICATION Multivitamin/Iron/Folic Acid (Centrum Ultra Women's Oral)-Take 1 tablet by mouth daily.  Marland Kitchen warfarin (COUMADIN) 5 MG tablet Take 5 mg by mouth as directed.   No current facility-administered medications on file prior to visit.    Review of Systems Per HPI unless specifically indicated in ROS section     Objective:    BP 100/70 mmHg  Pulse 81  Temp(Src) 98.1 F (36.7 C) (Oral)  Ht 6' 2.5" (1.892 m)  Wt 161 lb 12 oz (73.369 kg)  BMI 20.50 kg/m2  SpO2 99%  Wt  Readings from Last 3 Encounters:  10/11/15 161 lb 12 oz (73.369 kg)  07/07/15 167 lb 2 oz (75.807 kg)  04/21/15 168 lb 12.8 oz (76.567 kg)    Physical Exam  Constitutional: She appears well-developed and well-nourished. No distress.  HENT:  Head: Normocephalic and atraumatic.  Right Ear: Hearing, tympanic membrane, external ear and ear canal normal.  Left Ear: Hearing, external ear and ear canal normal.  Nose: Mucosal edema (mild) present. No rhinorrhea. Right sinus exhibits maxillary sinus tenderness and frontal sinus tenderness. Left sinus exhibits maxillary sinus tenderness and frontal sinus tenderness.  Mouth/Throat: Uvula is midline and mucous membranes are normal. Posterior oropharyngeal edema and posterior oropharyngeal erythema present. No oropharyngeal exudate or tonsillar abscesses.  Congestion/fluid behind L TM  Eyes: Conjunctivae and EOM are normal. Pupils are equal, round, and reactive to light. No scleral icterus.  Neck: Normal range of motion. Neck supple.  Cardiovascular: Normal rate, regular rhythm and intact distal pulses.   Murmur (mechanical) heard. Pulmonary/Chest: Effort normal and breath sounds normal. No respiratory distress. She has no wheezes. She has no rales.  clear  Lymphadenopathy:       Head (right side): No submental, no submandibular, no tonsillar, no preauricular and no posterior auricular adenopathy present.       Head (left side): Tonsillar adenopathy present. No submental, no submandibular, no preauricular and no posterior auricular adenopathy present.  She has cervical adenopathy.  Skin: Skin is warm and dry. No rash noted.  Nursing note and vitals reviewed.  Results for orders placed or performed in visit on 10/11/15  POCT rapid strep A  Result Value Ref Range   Rapid Strep A Screen Negative Negative      Assessment & Plan:   Problem List Items Addressed This Visit    Marfan's disease (Chronic)   Acute sinusitis - Primary    Given  progression and duration of sxs, cover with augmentin course + cheratussin. Advised to call coumadin clinic to ensure no change in dosing needed prior to next INR check next week. Pt agrees with plan.      Relevant Medications   amoxicillin-clavulanate (AUGMENTIN) 875-125 MG tablet   guaiFENesin-codeine (CHERATUSSIN AC) 100-10 MG/5ML syrup   Acute serous otitis media of left ear   Relevant Medications   amoxicillin-clavulanate (AUGMENTIN) 875-125 MG tablet   Acute pharyngitis   Relevant Orders   POCT rapid strep A (Completed)       Follow up plan: Return if symptoms worsen or fail to improve.

## 2015-10-11 NOTE — Assessment & Plan Note (Signed)
Given progression and duration of sxs, cover with augmentin course + cheratussin. Advised to call coumadin clinic to ensure no change in dosing needed prior to next INR check next week. Pt agrees with plan.

## 2015-10-11 NOTE — Patient Instructions (Signed)
You have a sinus infection, pharyngitis and fluid in left ear.  Take medicine as prescribed: augmentin 10 day course.  Push fluids and plenty of rest. Nasal saline irrigation or neti pot to help drain sinuses. May use plain mucinex with plenty of fluid to help mobilize mucous. Please let us know if fever >101.5, trouble opening/closing mouth, difficulty swallowing, or worsening instead of improving as expected.

## 2015-10-13 NOTE — Addendum Note (Signed)
Addended by: Ria Bush on: 10/13/2015 09:54 AM   Modules accepted: Miquel Dunn

## 2015-10-28 ENCOUNTER — Other Ambulatory Visit: Payer: Self-pay | Admitting: Physician Assistant

## 2015-11-14 ENCOUNTER — Ambulatory Visit: Payer: Self-pay

## 2015-12-12 NOTE — Telephone Encounter (Signed)
LM for pt to call and schedule flu shot or update records. °

## 2016-01-06 ENCOUNTER — Ambulatory Visit (INDEPENDENT_AMBULATORY_CARE_PROVIDER_SITE_OTHER): Payer: BLUE CROSS/BLUE SHIELD | Admitting: Behavioral Health

## 2016-01-06 DIAGNOSIS — Z308 Encounter for other contraceptive management: Secondary | ICD-10-CM | POA: Diagnosis not present

## 2016-01-06 LAB — POCT URINE PREGNANCY: PREG TEST UR: NEGATIVE

## 2016-01-06 MED ORDER — MEDROXYPROGESTERONE ACETATE 150 MG/ML IM SUSP
150.0000 mg | Freq: Once | INTRAMUSCULAR | Status: AC
  Administered 2016-01-06: 150 mg via INTRAMUSCULAR

## 2016-01-06 NOTE — Progress Notes (Signed)
Pre visit review using our clinic review tool, if applicable. No additional management support is needed unless otherwise documented below in the visit note.  Patient in office today for Depo-Provera injection. IM given in Right Deltoid. Patient tolerated injection well. She will call the office to schedule next appointment.

## 2016-02-16 DIAGNOSIS — I359 Nonrheumatic aortic valve disorder, unspecified: Secondary | ICD-10-CM | POA: Diagnosis not present

## 2016-02-16 DIAGNOSIS — I340 Nonrheumatic mitral (valve) insufficiency: Secondary | ICD-10-CM | POA: Diagnosis not present

## 2016-02-16 DIAGNOSIS — Z5181 Encounter for therapeutic drug level monitoring: Secondary | ICD-10-CM | POA: Diagnosis not present

## 2016-02-16 DIAGNOSIS — Z952 Presence of prosthetic heart valve: Secondary | ICD-10-CM | POA: Diagnosis not present

## 2016-05-05 ENCOUNTER — Encounter: Payer: Self-pay | Admitting: Physician Assistant

## 2016-05-05 ENCOUNTER — Ambulatory Visit (INDEPENDENT_AMBULATORY_CARE_PROVIDER_SITE_OTHER): Payer: Self-pay | Admitting: Physician Assistant

## 2016-05-05 VITALS — BP 110/80 | HR 98 | Temp 99.4°F | Resp 16 | Ht 74.5 in | Wt 156.5 lb

## 2016-05-05 DIAGNOSIS — M25551 Pain in right hip: Secondary | ICD-10-CM

## 2016-05-05 MED ORDER — HYDROCODONE-ACETAMINOPHEN 10-325 MG PO TABS: 1.0000 | ORAL_TABLET | Freq: Four times a day (QID) | ORAL | 0 refills | Status: DC | PRN

## 2016-05-05 NOTE — Progress Notes (Signed)
Patient presents to clinic today c/o pain in R hip and inguinal region started 2 weeks ago. Endorses at first onset of pain, she woke up and had difficulty walking due to severe pain in this region. Went to the ER at [facility] where she was given IM pain medication with resolution of pain. Denies having imaging at that time. Was told they were not sure what the cause of her symptoms were but felt related to her Marfan and history of labral tear of R hip/pelvis.  Endorses relief of pain until this past Thursday. Endorses being at work and using the restroom. Tried to get up and pain recurred and was very severe. Had to have help getting off the toilet. Was sent home and rested throughout the day. Symptoms resolved. Notes doing fine until yesterday. Woke up feeling well. Went to get out of bed and pain recurred with severity of 10/10. Rested and has taken OTC pain medication with mild relief, until pain resolved a few hours later. Patient denies known trauma or injury. Does have Marfan syndrome. Endorses history of labral tears. Denies numbness or tingling of leg during episodes. Does note a sensation of weakness in the leg with weight-bearing during episodes.  Today she is totally asymptomatic. Has recently lost insurance and states she cannot afford to go to specialist or have imaging until new insurance kicks in next month. Has missed a couple of days due to these spells and is afraid of losing her new job which will cause her to lose upcoming insurance benefits.  Past Medical History:  Diagnosis Date  . Anticoagulant long-term use    after valve surgery for Marfan's syndrome  . Anxiety   . Ascending aorta dilatation (HCC)   . Depression   . Marfan's syndrome    Dr. Atilano Median  . Migraine   . Mitral valve prolapse   . Palpitations   . Suicidal behavior    Tylenol Overdose  . Varicose veins     Current Outpatient Prescriptions on File Prior to Visit  Medication Sig Dispense Refill  . OVER  THE COUNTER MEDICATION Multivitamin/Iron/Folic Acid (Centrum Ultra Women's Oral)-Take 1 tablet by mouth daily.    Marland Kitchen warfarin (COUMADIN) 5 MG tablet Take 5 mg by mouth as directed.    . divalproex (DEPAKOTE ER) 500 MG 24 hr tablet Take 1 tablet (500 mg total) by mouth 3 (three) times daily. Please schedule follow-up appointment (Patient not taking: Reported on 05/05/2016) 90 tablet 1  . DULoxetine (CYMBALTA) 30 MG capsule Take 1 capsule (30 mg total) by mouth daily. (Patient not taking: Reported on 05/05/2016) 30 capsule 3   No current facility-administered medications on file prior to visit.     No Known Allergies  Family History  Problem Relation Age of Onset  . Cancer Mother 26    Breast   . Marfan syndrome Father   . Heart disease Father   . Marfan syndrome Sister   . Marfan syndrome Paternal Grandmother   . Marfan syndrome Sister     Social History   Social History  . Marital status: Divorced    Spouse name: N/A  . Number of children: N/A  . Years of education: 20   Occupational History  . 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 Smoking status: Former Smoker    Packs/day: 2.00    Years: 18.00    Types: Cigarettes    Quit date:  04/10/2013  . Smokeless tobacco: Current User     Comment: Currently using e-cigarrettes to help her quit smoking   . Alcohol use No  . Drug use: No  . Sexual activity: Not Currently   Other Topics Concern  . 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  She stopped smoking cigarettes in 04/2013 and started using e-cigarettes to help her quit.  She has weaned herself down on the nicotine.     Alcohol Use:  None   Drug Use:   None   Diet:  Regular   Exercise:  None   Hobbies:  Reading, Traveling                 Review of Systems - See HPI.  All other ROS are negative.  BP 110/80 (BP Location: Left Arm, Patient Position: Sitting, Cuff Size: Normal)   Pulse 98   Temp 99.4 F (37.4 C) (Oral)   Resp 16   Ht 6' 2.5" (1.892 m)   Wt 156 lb 8 oz (71 kg)   LMP 05/03/2016 Comment: Needs to restart Depo injections  SpO2 98%   BMI 19.82 kg/m   Physical Exam  Constitutional: She is oriented to person, place, and time and well-developed, well-nourished, and in no distress.  HENT:  Head: Normocephalic and atraumatic.  Cardiovascular: Normal rate, regular rhythm, normal heart sounds and intact distal pulses.   Pulmonary/Chest: Effort normal and breath sounds normal. No respiratory distress. She has no wheezes. She has no rales. She exhibits no tenderness.  Abdominal: Soft. Bowel sounds are normal. She exhibits no distension. There is no tenderness.  Musculoskeletal:       Right hip: She exhibits normal range of motion, normal strength, no tenderness, no bony tenderness and no swelling.       Left hip: She exhibits normal range of motion, normal strength, no tenderness and no bony tenderness.       Lumbar back: Normal.  Neurological: She is alert and oriented to person, place, and time. She has normal sensation, normal strength and normal reflexes.  Reflex Scores:      Patellar reflexes are 2+ on the right side and 2+ on the left side. Skin: Skin is warm and dry. No rash noted.  Psychiatric: Affect normal.  Vitals reviewed.   No results found for this or any previous visit (from the past 2160 hour(s)).  Assessment/Plan: 1. Right hip pain Unclear etiology. No symptoms at present. Symptoms occurring in spells without a true pattern. + history of Marfan and labral tears. Needs further assessment and imaging. Patient without insurance but will get in the next 30-45 days. Rx norco to have on hand in case of  recurrence. Supportive measures reviewed.  [facility] patient assistance paperwork given for her to fill out in case of recurrence warranting imaging before new insurance takes effect. Patient to update [name] on any new symptoms and come see [name] if flare-up occurs.   Leeanne Rio, PA-C

## 2016-05-05 NOTE — Patient Instructions (Signed)
Please keep pain medication on hand in case of a flare-up. Fill out the patient assistance paperwork for [facility] so when we need imaging we can get discounter services for you. I will reach out to see if I can find a specialist that will see you while you are waiting on getting new insurance.  Please let me know if pain recurs.  Have your work fax forms to [phone] Attn: Brunetta Jeans, PA-C

## 2016-05-05 NOTE — Progress Notes (Signed)
Pre visit review using our clinic review tool, if applicable. No additional management support is needed unless otherwise documented below in the visit note/SLS  

## 2016-05-10 ENCOUNTER — Telehealth: Payer: Self-pay

## 2016-05-10 NOTE — Telephone Encounter (Signed)
Received job description from [employer] to go with Medic[employer] evluation form patient to bring in for review and completion. Placed in folder for Daun Peacock.

## 2016-05-25 ENCOUNTER — Telehealth: Payer: Self-pay | Admitting: Physician Assistant

## 2016-05-25 NOTE — Telephone Encounter (Signed)
Caller name: Relationship to patient: Self Can be reached: [phone]  Pharmacy:  Reason for call: Patient called to inform Provider that her employer is mailing the papers to her and she will bring them to the office for the provider

## 2016-05-26 NOTE — Telephone Encounter (Signed)
Noted! Thank you

## 2016-07-07 ENCOUNTER — Telehealth: Payer: Self-pay | Admitting: Physician Assistant

## 2016-07-07 NOTE — Telephone Encounter (Signed)
Denial fax was forwarded back to [facility] on 07/06/16 for request of Coumadin stating "Please forward to Cardiology, as this is who manages medication"; phoned pharmacy today to reiterate that this request needs to be sent to Cardiology for refill authorization, understood & agreed/SLS 09/27

## 2016-07-07 NOTE — Telephone Encounter (Signed)
Yomi is calling from [employer] to request a refill of patient's warfarin (COUMADIN) 5 MG tablet  Pharmacy: [facility] at [address], [place], [place]

## 2016-07-16 ENCOUNTER — Encounter: Payer: Self-pay | Admitting: Physician Assistant

## 2016-07-16 ENCOUNTER — Ambulatory Visit (HOSPITAL_BASED_OUTPATIENT_CLINIC_OR_DEPARTMENT_OTHER)
Admission: RE | Admit: 2016-07-16 | Discharge: 2016-07-16 | Disposition: A | Discharge status: Home / Self Care | Payer: 59 | Source: Ambulatory Visit | Attending: Physician Assistant | Admitting: Physician Assistant

## 2016-07-16 ENCOUNTER — Other Ambulatory Visit: Payer: Self-pay | Admitting: Physician Assistant

## 2016-07-16 ENCOUNTER — Ambulatory Visit (INDEPENDENT_AMBULATORY_CARE_PROVIDER_SITE_OTHER): Payer: 59 | Admitting: Physician Assistant

## 2016-07-16 VITALS — BP 106/62 | HR 84 | Temp 98.1°F | Resp 16 | Ht 75.0 in | Wt 150.2 lb

## 2016-07-16 DIAGNOSIS — Q8740 Marfan's syndrome, unspecified: Secondary | ICD-10-CM | POA: Diagnosis not present

## 2016-07-16 DIAGNOSIS — M25551 Pain in right hip: Secondary | ICD-10-CM

## 2016-07-16 DIAGNOSIS — Z30013 Encounter for initial prescription of injectable contraceptive: Secondary | ICD-10-CM | POA: Diagnosis not present

## 2016-07-16 DIAGNOSIS — R936 Abnormal findings on diagnostic imaging of limbs: Secondary | ICD-10-CM | POA: Insufficient documentation

## 2016-07-16 MED ORDER — MEDROXYPROGESTERONE ACETATE 150 MG/ML IM SUSP
150.0000 mg | Freq: Once | INTRAMUSCULAR | Status: DC
Start: 2016-07-16 — End: 2016-07-16

## 2016-07-16 MED ORDER — MEDROXYPROGESTERONE ACETATE 150 MG/ML IM SUSP
150.0000 mg | Freq: Once | INTRAMUSCULAR | Status: AC
  Administered 2016-07-16: 150 mg via INTRAMUSCULAR

## 2016-07-16 MED ORDER — DULOXETINE HCL 20 MG PO CPEP: 20.0000 mg | ORAL_CAPSULE | Freq: Every day | ORAL | 3 refills | Status: DC

## 2016-07-16 NOTE — Patient Instructions (Signed)
Please go downstairs for your x-ray. I will call with your results.  You will be contacted to schedule imaging of the eye/head for further assessment of these intermittent headaches.  Please start the Cymbalta as directed.  I am going to find someone who deals with Marfan's more regularly. This may be a specialist at [facility]. I think it would be good to have them onboard for ongoing consultation.

## 2016-07-16 NOTE — Progress Notes (Signed)
Pre visit review using our clinic review tool, if applicable. No additional management support is needed unless otherwise documented below in the visit note/SLS  

## 2016-07-18 ENCOUNTER — Encounter: Payer: Self-pay | Admitting: Physician Assistant

## 2016-07-18 DIAGNOSIS — S73191D Other sprain of right hip, subsequent encounter: Secondary | ICD-10-CM

## 2016-07-18 DIAGNOSIS — M25551 Pain in right hip: Secondary | ICD-10-CM

## 2016-07-18 DIAGNOSIS — G8929 Other chronic pain: Secondary | ICD-10-CM

## 2016-07-18 DIAGNOSIS — Q8740 Marfan's syndrome, unspecified: Secondary | ICD-10-CM

## 2016-07-18 DIAGNOSIS — R9389 Abnormal findings on diagnostic imaging of other specified body structures: Secondary | ICD-10-CM

## 2016-07-24 ENCOUNTER — Ambulatory Visit (HOSPITAL_BASED_OUTPATIENT_CLINIC_OR_DEPARTMENT_OTHER)
Admission: RE | Admit: 2016-07-24 | Discharge: 2016-07-24 | Disposition: A | Discharge status: Home / Self Care | Payer: 59 | Source: Ambulatory Visit | Attending: Physician Assistant | Admitting: Physician Assistant

## 2016-07-24 DIAGNOSIS — Q8740 Marfan's syndrome, unspecified: Secondary | ICD-10-CM | POA: Diagnosis not present

## 2016-07-24 DIAGNOSIS — R938 Abnormal findings on diagnostic imaging of other specified body structures: Secondary | ICD-10-CM | POA: Diagnosis not present

## 2016-07-24 DIAGNOSIS — M25551 Pain in right hip: Secondary | ICD-10-CM | POA: Diagnosis not present

## 2016-07-24 DIAGNOSIS — G8929 Other chronic pain: Secondary | ICD-10-CM | POA: Insufficient documentation

## 2016-07-24 DIAGNOSIS — R9389 Abnormal findings on diagnostic imaging of other specified body structures: Secondary | ICD-10-CM

## 2016-07-24 DIAGNOSIS — S73191D Other sprain of right hip, subsequent encounter: Secondary | ICD-10-CM

## 2016-07-24 MED ORDER — GADOBENATE DIMEGLUMINE 529 MG/ML IV SOLN: 12.0000 mL | Freq: Once | INTRAVENOUS | Status: DC | PRN

## 2016-07-25 ENCOUNTER — Encounter: Payer: Self-pay | Admitting: Physician Assistant

## 2016-07-26 ENCOUNTER — Telehealth: Payer: Self-pay | Admitting: Physician Assistant

## 2016-07-26 DIAGNOSIS — M25551 Pain in right hip: Secondary | ICD-10-CM

## 2016-07-26 DIAGNOSIS — G548 Other nerve root and plexus disorders: Principal | ICD-10-CM

## 2016-07-26 DIAGNOSIS — G8929 Other chronic pain: Secondary | ICD-10-CM

## 2016-07-26 DIAGNOSIS — G96191 Perineural cyst: Secondary | ICD-10-CM

## 2016-07-26 NOTE — Telephone Encounter (Signed)
Relation to [id number] Call back number:[phone]   Reason for call:  Patient returning call and stated she's fine with being referred to [facility] but does not want to see the same orthopedic she saw last year due to concerns not being resolved. Please advise

## 2016-07-27 NOTE — Telephone Encounter (Signed)
Referral placed as directed by result note. Per verbal from [place], place urgent referral, dx Tarlov cyst and chronic R hip pain.

## 2016-07-29 ENCOUNTER — Other Ambulatory Visit: Payer: Self-pay | Admitting: Physician Assistant

## 2016-07-29 NOTE — Telephone Encounter (Signed)
Please advise refill, last documentation regarding Bipolar Disorder is from 06/13/14.

## 2016-07-29 NOTE — Telephone Encounter (Signed)
Please call patient to verify if and  how she has been taking. I think she had been out due to cost previously.

## 2016-07-30 NOTE — Telephone Encounter (Signed)
Called pt and left detailed message (per DPR) to return call or send MyChart message to let us know if and how she has been taking depakote.

## 2016-07-30 NOTE — Telephone Encounter (Signed)
Medication has been sent. Thank you! 

## 2016-07-30 NOTE — Telephone Encounter (Signed)
Started taking again about 1 month ago. Takes 500mg  3xday. She didn't have a job for a while so stopped taking bc she did not have insurance.   Pharmacy: [id number] - [place], [place] - [address] AT [address]OSS FROM [address]

## 2016-08-02 NOTE — Progress Notes (Signed)
Patient presents to clinic today c/o for follow-up of episodic R hip pain. Patient reports that episodes are not more frequent but are lasting for longer periods of times -- up to hours, followed by a couple of days of residual soreness. Again denies trauma or injury. Denies numbness, tingling, weakness or bruising. Patient has not followed up with Orthopedics as directed. Patient would like to discuss restarting Cymbalta for pain as noted this worked well for her in the past.  Patient would like to discuss restarting her DepoProvera injections. Had stopped due to loss of insurance and cost. Was doing very well on this regimen. Denies concern for pregnancy.   Past Medical History:  Diagnosis Date  . Anticoagulant long-term use    after valve surgery for Marfan's syndrome  . Anxiety   . Ascending aorta dilatation (HCC)   . Depression   . Marfan's syndrome    Dr. Atilano Median  . Migraine   . Mitral valve prolapse   . Palpitations   . Suicidal behavior    Tylenol Overdose  . Varicose veins     Current Outpatient Prescriptions on File Prior to Visit  Medication Sig Dispense Refill  . HYDROcodone-acetaminophen (NORCO) 10-325 MG tablet Take 1 tablet by mouth every 6 (six) hours as needed. 45 tablet 0  . OVER THE COUNTER MEDICATION Multivitamin/Iron/Folic Acid (Centrum Ultra Women's Oral)-Take 1 tablet by mouth daily.    Marland Kitchen warfarin (COUMADIN) 5 MG tablet Take 5 mg by mouth as directed.     No current facility-administered medications on file prior to visit.     No Known Allergies  Family History  Problem Relation Age of Onset  . Cancer Mother 85    Breast   . Marfan syndrome Father   . Heart disease Father   . Marfan syndrome Sister   . Marfan syndrome Paternal Grandmother   . Marfan syndrome Sister     Social History   Social History  . Marital status: Divorced    Spouse name: N/A  . Number of children: N/A  . Years of education: 39   Occupational History  . RETENTION  MANAGER  [employer]   Social History Main Topics  . Smoking status: Former Smoker    Packs/day: 2.00    Years: 18.00    Types: Cigarettes    Quit date: 04/10/2013  . Smokeless tobacco: Current User     Comment: Currently using e-cigarrettes to help her quit smoking   . Alcohol use No  . Drug use: No  . Sexual activity: Not Currently   Other Topics Concern  . 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  She stopped smoking cigarettes in 04/2013 and started using e-cigarettes to help her quit.  She has weaned herself down on the nicotine.     Alcohol Use:  None   Drug Use:  None   Diet:  Regular   Exercise:  None   Hobbies:  Reading, Traveling                Review of Systems - See HPI.  All other ROS are negative.  BP  106/62 (BP Location: Left Arm, Patient Position: Sitting, Cuff Size: Normal)   Pulse 84   Temp 98.1 F (36.7 C) (Oral)   Resp 16   Ht 6\' 3"  (1.905 m)   Wt 150 lb 4 oz (68.2 kg)   SpO2 98%   BMI 18.78 kg/m   Physical Exam  Constitutional: She is oriented to person, place, and time and well-developed, well-nourished, and in no distress.  HENT:  Head: Normocephalic and atraumatic.  Eyes: Conjunctivae are normal.  Neck: Neck supple.  Cardiovascular: Normal rate, regular rhythm, normal heart sounds and intact distal pulses.   Pulmonary/Chest: Effort normal and breath sounds normal. No respiratory distress. She has no wheezes. She has no rales. She exhibits no tenderness.  Musculoskeletal:       Right hip: She exhibits normal range of motion, normal strength, no tenderness, no bony tenderness, no swelling, no crepitus, no deformity and no laceration.        Lumbar back: Normal.  Neurological: She is alert and oriented to person, place, and time.  Skin: Skin is warm and dry. No rash noted.  Psychiatric: Affect normal.  Vitals reviewed.  Assessment/Plan: 1. Right hip pain Will obtain x-ray today to further assess. Discussed likely need for MRI. Will restart Cymbalta. Continue Pain medication as directed when needed. Supportive measures reviewed. Will get her back in with Ortho once images are obtained. - DULoxetine (CYMBALTA) 20 MG capsule; Take 1 capsule (20 mg total) by mouth daily.  Dispense: 30 capsule; Refill: 3  2. Encounter for initial prescription of injectable contraceptive Urine pregnancy negative. Will restart injections. - medroxyPROGESTERone (DEPO-PROVERA) injection 150 mg; Inject 1 mL (150 mg total) into the muscle once.   Leeanne Rio, PA-C

## 2016-08-16 ENCOUNTER — Other Ambulatory Visit: Payer: Self-pay | Admitting: Physician Assistant

## 2016-08-16 DIAGNOSIS — M25551 Pain in right hip: Secondary | ICD-10-CM

## 2016-08-16 MED ORDER — DULOXETINE HCL 20 MG PO CPEP: 20.0000 mg | ORAL_CAPSULE | Freq: Every day | ORAL | 1 refills | Status: DC

## 2016-08-18 ENCOUNTER — Telehealth: Payer: Self-pay | Admitting: *Deleted

## 2016-08-18 NOTE — Telephone Encounter (Signed)
Pharmacy request for Depakote Denied; last Rx to pharmacy 07/30/16/[facility] 11/08

## 2016-09-03 ENCOUNTER — Other Ambulatory Visit: Payer: Self-pay | Admitting: Physician Assistant

## 2016-09-07 ENCOUNTER — Other Ambulatory Visit: Payer: Self-pay | Admitting: Physician Assistant

## 2016-09-07 DIAGNOSIS — Z5181 Encounter for therapeutic drug level monitoring: Secondary | ICD-10-CM

## 2016-09-07 MED ORDER — DIVALPROEX SODIUM ER 500 MG PO TB24: 1500.0000 mg | ORAL_TABLET | Freq: Every day | ORAL | 0 refills | Status: DC

## 2016-09-07 NOTE — Telephone Encounter (Signed)
Was just refilled on the 24th. She needs to check with pharmacy. Also she needs to come to lab for a Valproic acid level check after restarting medications. No further refills until this level is checked.

## 2016-09-07 NOTE — Telephone Encounter (Signed)
Patient had restarted Depakote in October. Was due for repeat office visit after starting for depakote level and CMP. Has not come in for appointment. Refill request received today. Only 10 day supply given until labs are updated. This medication requires surveillance. Will not continue to prescribe if patient is not compliant with labs and follow-up.

## 2016-09-07 NOTE — Telephone Encounter (Signed)
Mychart message sent to patient to call and set up a lab appointment per verbal order from patients PCP Raiford Noble PA-C.

## 2016-09-07 NOTE — Telephone Encounter (Signed)
Refill sent per [facility] refill protocol/SLS  

## 2016-09-07 NOTE — Addendum Note (Signed)
Addended by: Raiford Noble on: 09/07/2016 04:34 PM   Modules accepted: Orders

## 2016-09-07 NOTE — Telephone Encounter (Signed)
Please advise 

## 2016-09-08 ENCOUNTER — Other Ambulatory Visit: Payer: Self-pay | Admitting: Physician Assistant

## 2016-09-09 ENCOUNTER — Other Ambulatory Visit: Payer: Self-pay | Admitting: Physician Assistant

## 2016-09-09 ENCOUNTER — Encounter: Payer: Self-pay | Admitting: Physician Assistant

## 2016-09-09 DIAGNOSIS — Z5181 Encounter for therapeutic drug level monitoring: Secondary | ICD-10-CM

## 2016-09-09 NOTE — Telephone Encounter (Signed)
Patient scheduled for labs at the [place] location 09/10/16

## 2016-09-10 ENCOUNTER — Other Ambulatory Visit (INDEPENDENT_AMBULATORY_CARE_PROVIDER_SITE_OTHER): Payer: 59

## 2016-09-10 ENCOUNTER — Telehealth: Payer: Self-pay | Admitting: Physician Assistant

## 2016-09-10 ENCOUNTER — Other Ambulatory Visit: Payer: Self-pay | Admitting: Physician Assistant

## 2016-09-10 ENCOUNTER — Encounter: Payer: Self-pay | Admitting: Physician Assistant

## 2016-09-10 DIAGNOSIS — Z5181 Encounter for therapeutic drug level monitoring: Secondary | ICD-10-CM

## 2016-09-10 LAB — COMPREHENSIVE METABOLIC PANEL
ALBUMIN: 3.9 g/dL (ref 3.5–5.2)
ALK PHOS: 35 U/L — AB (ref 39–117)
ALT: 16 U/L (ref 0–35)
AST: 21 U/L (ref 0–37)
BILIRUBIN TOTAL: 0.6 mg/dL (ref 0.2–1.2)
BUN: 16 mg/dL (ref 6–23)
CALCIUM: 8.9 mg/dL (ref 8.4–10.5)
CO2: 27 meq/L (ref 19–32)
Chloride: 111 mEq/L (ref 96–112)
Creatinine, Ser: 0.66 mg/dL (ref 0.40–1.20)
GFR: 105.46 mL/min (ref >60.00)
Glucose, Bld: 95 mg/dL (ref 70–99)
Potassium: 4.7 mEq/L (ref 3.5–5.1)
Sodium: 142 mEq/L (ref 135–145)
Total Protein: 6.5 g/dL (ref 6.0–8.3)

## 2016-09-10 LAB — CBC
HCT: 39.2 % (ref 36.0–46.0)
Hemoglobin: 13.2 g/dL (ref 12.0–15.0)
MCHC: 33.6 g/dL (ref 30.0–36.0)
MCV: 90.2 fl (ref 78.0–100.0)
PLATELETS: 212 10*3/uL (ref 150.0–400.0)
RBC: 4.35 Mil/uL (ref 3.87–5.11)
RDW: 16.2 % — ABNORMAL HIGH (ref 11.5–15.5)
WBC: 7 10*3/uL (ref 4.0–10.5)

## 2016-09-10 LAB — VALPROIC ACID LEVEL: Valproic Acid Lvl: <12.5 ug/mL — ABNORMAL LOW (ref 50.0–100.0)

## 2016-09-10 NOTE — Telephone Encounter (Signed)
Called and gave information to the pharmacist to advise that we cannot fill a 90 day prescription due to the pt not following up with provider as requested and not having her divalproex checked as needed.

## 2016-09-10 NOTE — Telephone Encounter (Signed)
Please advise 

## 2016-09-10 NOTE — Telephone Encounter (Signed)
Absolutely not until she has labs drawn and resulted as she is overdue for lab monitoring

## 2016-09-10 NOTE — Telephone Encounter (Signed)
[facility]rel [facility] calling and asking if pt could have a 90 supply on divalproex, states this would be cheaper with insurance.

## 2016-09-13 ENCOUNTER — Encounter: Payer: Self-pay | Admitting: Physician Assistant

## 2016-09-14 MED ORDER — QUETIAPINE FUMARATE 100 MG PO TABS: ORAL_TABLET | ORAL | 1 refills | Status: DC

## 2016-09-17 ENCOUNTER — Encounter: Payer: Self-pay | Admitting: Physician Assistant

## 2016-09-17 ENCOUNTER — Ambulatory Visit (INDEPENDENT_AMBULATORY_CARE_PROVIDER_SITE_OTHER): Payer: 59 | Admitting: Physician Assistant

## 2016-09-17 VITALS — BP 108/80 | HR 81 | Temp 98.3°F | Resp 14 | Ht 75.0 in | Wt 158.0 lb

## 2016-09-17 DIAGNOSIS — Z23 Encounter for immunization: Secondary | ICD-10-CM | POA: Diagnosis not present

## 2016-09-17 DIAGNOSIS — Z13220 Encounter for screening for lipoid disorders: Secondary | ICD-10-CM

## 2016-09-17 DIAGNOSIS — F3160 Bipolar disorder, current episode mixed, unspecified: Secondary | ICD-10-CM

## 2016-09-17 LAB — LIPID PANEL
CHOLESTEROL: 217 mg/dL — AB (ref 0–200)
HDL: 47.2 mg/dL (ref >39.00)
LDL Cholesterol: 138 mg/dL — ABNORMAL HIGH (ref 0–99)
NonHDL: 169.31
Total CHOL/HDL Ratio: 5
Triglycerides: 155 mg/dL — ABNORMAL HIGH (ref 0.0–149.0)
VLDL: 31 mg/dL (ref 0.0–40.0)

## 2016-09-17 NOTE — Progress Notes (Signed)
Patient presents to clinic today for follow-up of Bipolar Disorder. Patient with history of rapid cycling, has been on regimen of Cymbalta and Depakote XR off and on over the past years. Finances have been an issue. Has been off of Depakote for several weeks. Recent CBC and CMP for monitoring within normal limits. Depakote level was low giving she was off of medication unknown to [name].  We had discussed cost of Depakote and need for routine surveillance. Decided to begin trial or Seroquel. Patient is currently on 100 mg, titrating up to 300 mg. Endorses tolerating well. Endorses restful sleep. Denies manic symptoms or depressed mood. Denies SI/HI.  Past Medical History:  Diagnosis Date  . Anticoagulant long-term use    after valve surgery for Marfan's syndrome  . Anxiety   . Ascending aorta dilatation (HCC)   . Depression   . Marfan's syndrome    Dr. Atilano Median  . Migraine   . Mitral valve prolapse   . Palpitations   . Suicidal behavior    Tylenol Overdose  . Varicose veins     Current Outpatient Prescriptions on File Prior to Visit  Medication Sig Dispense Refill  . DULoxetine (CYMBALTA) 20 MG capsule Take 1 capsule (20 mg total) by mouth daily. 90 capsule 1  . OVER THE COUNTER MEDICATION Multivitamin/Iron/Folic Acid (Centrum Ultra Women's Oral)-Take 1 tablet by mouth daily.    . QUEtiapine (SEROQUEL) 100 MG tablet Take 1 tablet (100 mg) each evening x 2 days. Then increased to 2 tablets (200 mg) each evening x 2 days. If tolerating well, increase to 3 tablets (300 mg) each evening. 90 tablet 1  . warfarin (COUMADIN) 5 MG tablet Take 5 mg by mouth as directed.     No current facility-administered medications on file prior to visit.     No Known Allergies  Family History  Problem Relation Age of Onset  . Cancer Mother 59    Breast   . Marfan syndrome Father   . Heart disease Father   . Marfan syndrome Sister   . Marfan syndrome Paternal Grandmother   . Marfan syndrome Sister      Social History   Social History  . Marital status: Divorced    Spouse name: N/A  . Number of children: N/A  . Years of education: 30   Occupational History  . 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 Smoking status: Former Smoker    Packs/day: 2.00    Years: 18.00    Types: Cigarettes    Quit date: 04/10/2013  . Smokeless tobacco: Current User     Comment: Currently using e-cigarrettes to help her quit smoking   . Alcohol use No  . Drug use: No  . Sexual activity: Not Currently   Other Topics Concern  . 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  She stopped smoking cigarettes in 04/2013 and started using e-cigarettes to help her quit.  She has weaned herself down on the nicotine.     Alcohol Use:  None   Drug Use:  None   Diet:  Regular  Exercise:  None   Hobbies:  Reading, Traveling                Review of Systems - See HPI.  All other ROS are negative.  BP 108/80   Pulse 81   Temp 98.3 F (36.8 C) (Oral)   Resp 14   Ht 6' 3"  (1.905 m)   Wt 158 lb (71.7 kg)   SpO2 98%   BMI 19.75 kg/m   Physical Exam  Constitutional: She is oriented to person, place, and time and well-developed, well-nourished, and in no distress.  HENT:  Head: Normocephalic and atraumatic.  Eyes: Conjunctivae are normal.  Neck: Neck supple. No thyromegaly present.  Cardiovascular: Normal rate, regular rhythm, normal heart sounds and intact distal pulses.   Pulmonary/Chest: Effort normal and breath sounds normal. No respiratory distress. She has no wheezes. She has no rales. She exhibits no tenderness.  Neurological: She is alert and oriented to person, place, and  time.  Skin: Skin is warm and dry. No rash noted.  Psychiatric: Affect normal.  Vitals reviewed.  Recent Results (from the past 2160 hour(s))  Valproic Acid level     Status: Abnormal   Collection Time: 09/10/16 12:03 PM  Result Value Ref Range   Valproic Acid Lvl <12.5 (L) 50.0 - 100.0 ug/mL    Comment: Result repeated and verified.  Comp Met (CMET)     Status: Abnormal   Collection Time: 09/10/16 12:03 PM  Result Value Ref Range   Sodium 142 135 - 145 mEq/L   Potassium 4.7 3.5 - 5.1 mEq/L   Chloride 111 96 - 112 mEq/L   CO2 27 19 - 32 mEq/L   Glucose, Bld 95 70 - 99 mg/dL   BUN 16 6 - 23 mg/dL   Creatinine, Ser 0.66 0.40 - 1.20 mg/dL   Total Bilirubin 0.6 0.2 - 1.2 mg/dL   Alkaline Phosphatase 35 (L) 39 - 117 U/L   AST 21 0 - 37 U/L   ALT 16 0 - 35 U/L   Total Protein 6.5 6.0 - 8.3 g/dL   Albumin 3.9 3.5 - 5.2 g/dL   Calcium 8.9 8.4 - 10.5 mg/dL   GFR 105.46 >60.00 mL/min  CBC     Status: Abnormal   Collection Time: 09/10/16 12:03 PM  Result Value Ref Range   WBC 7.0 4.0 - 10.5 K/uL   RBC 4.35 3.87 - 5.11 Mil/uL   Platelets 212.0 150.0 - 400.0 K/uL   Hemoglobin 13.2 12.0 - 15.0 g/dL   HCT 39.2 36.0 - 46.0 %   MCV 90.2 78.0 - 100.0 fl   MCHC 33.6 30.0 - 36.0 g/dL   RDW 16.2 (H) 11.5 - 15.5 %    Assessment/Plan: 1. Lipid screening Overdue. Will check lipids today.  - Lipid panel  2. Bipolar disorder, mixed ([place]) Tolerating Seroquel well so far. Will continue titration up to 300 mg. FU scheduled.   TDaP updated today.  Patient to schedule appointment for routine gynecological exam and PAP.     Leeanne Rio, PA-C

## 2016-09-17 NOTE — Progress Notes (Signed)
Pre visit review using our clinic review tool, if applicable. No additional management support is needed unless otherwise documented below in the visit note. 

## 2016-09-17 NOTE — Patient Instructions (Signed)
I am glad you are tolerating the medication well so far.  Please continue titration of medication as directed on the bottle.   Mychart message me or call me next week to let us know how you are doing.  Follow-up in office in 1 month. Also please schedule appointment for your routine gynecological exam and PAP smear with [name] at your earliest convenience.   Go to the lab before leaving today. I will call with your results.

## 2016-09-22 ENCOUNTER — Encounter: Payer: Self-pay | Admitting: Physician Assistant

## 2016-10-11 ENCOUNTER — Encounter: Payer: Self-pay | Admitting: Physician Assistant

## 2016-10-12 NOTE — Telephone Encounter (Signed)
Attempted to reach patient on her phone. No answer. LMOM for callback.  MyChart response sent. Will await her response.

## 2016-10-13 ENCOUNTER — Ambulatory Visit (INDEPENDENT_AMBULATORY_CARE_PROVIDER_SITE_OTHER): Payer: 59 | Admitting: Physician Assistant

## 2016-10-13 ENCOUNTER — Encounter: Payer: Self-pay | Admitting: Physician Assistant

## 2016-10-13 VITALS — BP 122/64 | HR 46 | Temp 98.7°F | Resp 16 | Ht 75.0 in | Wt 156.0 lb

## 2016-10-13 DIAGNOSIS — F3110 Bipolar disorder, current episode manic without psychotic features, unspecified: Secondary | ICD-10-CM

## 2016-10-13 DIAGNOSIS — I493 Ventricular premature depolarization: Secondary | ICD-10-CM

## 2016-10-13 DIAGNOSIS — I498 Other specified cardiac arrhythmias: Secondary | ICD-10-CM

## 2016-10-13 NOTE — Progress Notes (Signed)
Patient presents to clinic today c/o acute worsening of bipolar disorder with recurrence of manic episodes over the past couple of weeks. Patient endorses multiple triggers for this change including suicide of a loved one and her husband having a relapse on drug use. States she does not know where her husband is. Endorses some depressed mood but mainly notes manic behavior including insomnia and inappropriate spending. Is currently on a regimen of Cymbalta 20 mg and Seroquel 300 mg. Denies SI/HI. Has a support system of a few close friends.  Past Medical History:  Diagnosis Date  . Anticoagulant long-term use    after valve surgery for Marfan's syndrome  . Anxiety   . Ascending aorta dilatation (HCC)   . Depression   . Marfan's syndrome    Dr. Atilano Median  . Migraine   . Mitral valve prolapse   . Palpitations   . Suicidal behavior    Tylenol Overdose  . Varicose veins     Current Outpatient Prescriptions on File Prior to Visit  Medication Sig Dispense Refill  . DULoxetine (CYMBALTA) 20 MG capsule Take 1 capsule (20 mg total) by mouth daily. 90 capsule 1  . OVER THE COUNTER MEDICATION Multivitamin/Iron/Folic Acid (Centrum Ultra Women's Oral)-Take 1 tablet by mouth daily.    . QUEtiapine (SEROQUEL) 100 MG tablet Take 1 tablet (100 mg) each evening x 2 days. Then increased to 2 tablets (200 mg) each evening x 2 days. If tolerating well, increase to 3 tablets (300 mg) each evening. (Patient taking differently: Take 300 mg by mouth every evening. ) 90 tablet 1  . warfarin (COUMADIN) 5 MG tablet Take 5 mg by mouth as directed.     No current facility-administered medications on file prior to visit.     No Known Allergies  Family History  Problem Relation Age of Onset  . Cancer Mother 29    Breast   . Marfan syndrome Father   . Heart disease Father   . Marfan syndrome Sister   . Marfan syndrome Paternal Grandmother   . Marfan syndrome Sister     Social History   Social History  .  Marital status: Divorced    Spouse name: N/A  . Number of children: N/A  . Years of education: 38   Occupational History  . 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 Smoking status: Former Smoker    Packs/day: 2.00    Years: 18.00    Types: Cigarettes    Quit date: 04/10/2013  . Smokeless tobacco: Current User     Comment: Currently using e-cigarrettes to help her quit smoking   . Alcohol use No  . Drug use: No  . Sexual activity: Not Currently   Other Topics Concern  . 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  She stopped smoking cigarettes in 04/2013 and started using e-cigarettes to help her quit.  She has weaned herself down on the nicotine.     Alcohol Use:  None   Drug Use:  None   Diet:  Regular   Exercise:  None   Hobbies:  Reading, Traveling                 Review of Systems - See HPI.  All other ROS are negative.  BP 122/64   Pulse (!) 46   Temp 98.7 F (37.1 C) (Oral)   Resp 16   Ht _0  (1.905 m)   Wt 156 lb (70.8 kg)   SpO2 97%   BMI 19.50 kg/m   Physical Exam  Constitutional: She is oriented to person, place, and time and well-developed, well-nourished, and in no distress.  HENT:  Head: Normocephalic and atraumatic.  Eyes: Conjunctivae are normal.  Neck: Neck supple. No thyromegaly present.  Cardiovascular: Normal rate and intact distal pulses.  Frequent extrasystoles are present.  No murmur heard. Pulmonary/Chest: Effort normal and breath sounds normal. No respiratory distress. She has no wheezes. She has no rales. She exhibits no tenderness.  Neurological: She is alert and oriented to person, place, and time. No cranial  nerve deficit.  Skin: Skin is warm and dry. No rash noted.  Psychiatric: Her mood appears anxious. Her affect is not inappropriate. She is not agitated. She does not express impulsivity. She does not exhibit a depressed mood. She expresses no homicidal and no suicidal ideation. She expresses no suicidal plans and no homicidal plans. She exhibits ordered thought content. She does not have a flat affect.  Vitals reviewed.  Recent Results (from the past 2160 hour(s))  Valproic Acid level     Status: Abnormal   Collection Time: 09/10/16 12:03 PM  Result Value Ref Range   Valproic Acid Lvl <12.5 (L) 50.0 - 100.0 ug/mL    Comment: Result repeated and verified.  Comp Met (CMET)     Status: Abnormal   Collection Time: 09/10/16 12:03 PM  Result Value Ref Range   Sodium 142 135 - 145 mEq/L   Potassium 4.7 3.5 - 5.1 mEq/L   Chloride 111 96 - 112 mEq/L   CO2 27 19 - 32 mEq/L   Glucose, Bld 95 70 - 99 mg/dL   BUN 16 6 - 23 mg/dL   Creatinine, Ser 0.66 0.40 - 1.20 mg/dL   Total Bilirubin 0.6 0.2 - 1.2 mg/dL   Alkaline Phosphatase 35 (L) 39 - 117 U/L   AST 21 0 - 37 U/L   ALT 16 0 - 35 U/L   Total Protein 6.5 6.0 - 8.3 g/dL   Albumin 3.9 3.5 - 5.2 g/dL   Calcium 8.9 8.4 - 10.5 mg/dL   GFR 105.46 >60.00 mL/min  CBC     Status: Abnormal   Collection Time: 09/10/16 12:03 PM  Result Value Ref Range   WBC 7.0 4.0 - 10.5 K/uL   RBC 4.35 3.87 - 5.11 Mil/uL   Platelets 212.0 150.0 - 400.0 K/uL   Hemoglobin 13.2 12.0 - 15.0 g/dL   HCT 39.2 36.0 - 46.0 %   MCV 90.2 78.0 - 100.0 fl   MCHC 33.6 30.0 - 36.0 g/dL   RDW 16.2 (H) 11.5 - 15.5 %  Lipid panel     Status: Abnormal   Collection Time: 09/17/16  2:15 PM  Result Value Ref Range   Cholesterol 217 (H) 0 - 200 mg/dL    Comment: ATP III Classification       Desirable:  < 200 mg/dL               Borderline High:  200 - 239 mg/dL          High:  > =  240 mg/dL   Triglycerides 155.0 (H) 0.0 - 149.0 mg/dL    Comment: Normal:  <150 mg/dLBorderline  High:  150 - 199 mg/dL   HDL 47.20 >39.00 mg/dL   VLDL 31.0 0.0 - 40.0 mg/dL   LDL Cholesterol 138 (H) 0 - 99 mg/dL   Total CHOL/HDL Ratio 5     Comment:                Men          Women1/2 Average Risk     3.4          3.3Average Risk          5.0          4.42X Average Risk          9.6          7.13X Average Risk          15.0          11.0                       NonHDL 169.31     Comment: NOTE:  Non-HDL goal should be 30 mg/dL higher than patient's LDL goal (i.e. LDL goal of < 70 mg/dL, would have non-HDL goal of < 100 mg/dL)    Assessment/Plan: 1. Frequent PVCs Noted on examination. Patient denies chest pain, palpitations, lightheadedness, dizziness, vision changes or frequent headaches. EKG revealed sinus rhythm with frequent PVC/trigeminy. Asymptomatic. Will check TSH. Urgent referral to Cardiology placed. Appointment scheduled. Will check TSH today. Alarm signs/symptoms discussed that would prompt ER assessment.   - EKG 12-Lead - TSH - Ambulatory referral to Cardiology  2. Ventricular trigeminy As above.  - Ambulatory referral to Cardiology  3. Bipolar I disorder, most recent episode (or current) manic ([place]) No SI/HI. Patient to set up counseling appointment. Referral back to psychiatry placed. Will increase Seroquel to 200 mg BID for a few days. If doing well will increase to 200 AM and 300 mg QHS. Strict ER precautions given. Patient voices understanding. FU scheduled for next week.    Leeanne Rio, PA-C

## 2016-10-13 NOTE — Patient Instructions (Signed)
Change Seroquel to 200 mg twice daily for 2 days. Then increase to 200 mg AM and 300 mg in the evening. Follow-up in 1 week.   Please schedule an appointment with Terri.  I am setting you up with psychiatry.   If you note any thoughts of harming yourself or others, please call 911.

## 2016-10-13 NOTE — Progress Notes (Signed)
Pre visit review using our clinic review tool, if applicable. No additional management support is needed unless otherwise documented below in the visit note. 

## 2016-10-14 LAB — TSH: TSH: 1.18 u[IU]/mL (ref 0.450–4.500)

## 2016-10-14 MED ORDER — QUETIAPINE FUMARATE 100 MG PO TABS: ORAL_TABLET | ORAL | 1 refills | Status: DC

## 2016-10-14 NOTE — Progress Notes (Deleted)
Cardiology Office Note   Date:  10/14/2016   ID:  Melissa Hayden, DOB Apr 15, 1976, MRN [id number]  PCP:  Leeanne Rio, PA-C  Cardiologist:   Minus Breeding, MD  Referring:  ***  No chief complaint on file.     History of Present Illness: Melissa Hayden is a 41 y.o. female who presents for ***  The patient has a history of Marfan's disease.  She's had a mitral valve replacement. 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 This was done at [facility]. I have been able to review records.  Of note she does have a cyst consistent with a perineural cyst or meningocele in the posterior mediastinal/pleural space.  Past Medical History:  Diagnosis Date  . Anticoagulant long-term use    after valve surgery for Marfan's syndrome  . Anxiety   . Ascending aorta dilatation (HCC)   . Depression   . Marfan's syndrome    Dr. Atilano Median  . Migraine   . Mitral valve prolapse   . Palpitations   . Suicidal behavior    Tylenol Overdose  . Varicose veins     Past Surgical History:  Procedure Laterality Date  . ANKLE SURGERY Left   . BREAST CYST EXCISION    . CARDIAC VALVE SURGERY  09/2014   AV, MV replacement, aortic root replacement (Dr Ysidro Evert at [facility])  . INNER EAR SURGERY Right      Current Outpatient Prescriptions  Medication Sig Dispense Refill  . DULoxetine (CYMBALTA) 20 MG capsule Take 1 capsule (20 mg total) by mouth daily. 90 capsule 1  . OVER THE COUNTER MEDICATION Multivitamin/Iron/Folic Acid (Centrum Ultra Women's Oral)-Take 1 tablet by mouth daily.    . QUEtiapine (SEROQUEL) 100 MG tablet Take 200 mg by mouth twice daily 120 tablet 1  . warfarin (COUMADIN) 5 MG tablet Take 5 mg by mouth as directed.     No current facility-administered medications for this visit.     Allergies:   Patient has no known allergies.    Social History:  The patient  reports that she quit smoking about 3 years ago. Her smoking use included Cigarettes. She has a 36.00  pack-year smoking history. She uses smokeless tobacco. She reports that she does not drink alcohol or use drugs.   Family History:  The patient's ***family history includes Cancer (age of onset: 22) in her mother; Heart disease in her father; Marfan syndrome in her father, paternal grandmother, sister, and sister.    ROS:  Please see the history of present illness.   Otherwise, review of systems are positive for {NONE DEFAULTED:18576::"none"}.   All other systems are reviewed and negative.    PHYSICAL EXAM: VS:  There were no vitals taken for this visit. , BMI There is no height or weight on file to calculate BMI. GENERAL:  Well appearing HEENT:  Pupils equal round and reactive, fundi not visualized, oral mucosa unremarkable NECK:  No jugular venous distention, waveform within normal limits, carotid upstroke brisk and symmetric, no bruits, no thyromegaly LYMPHATICS:  No cervical, inguinal adenopathy LUNGS:  Clear to auscultation bilaterally BACK:  No CVA tenderness CHEST:  Unremarkable HEART:  PMI not displaced or sustained,S1 and S2 within normal limits, no S3, no S4, no clicks, no rubs, *** murmurs ABD:  Flat, positive bowel sounds normal in frequency in pitch, no bruits, no rebound, no guarding, no midline pulsatile mass, no hepatomegaly, no splenomegaly EXT:  2 plus pulses throughout, no  edema, no cyanosis no clubbing SKIN:  No rashes no nodules NEURO:  Cranial nerves II through XII grossly intact, motor grossly intact throughout PSYCH:  Cognitively intact, oriented to person place and time    EKG:  EKG {ACTION; IS/IS GI:087931 ordered today. The ekg ordered today demonstrates ***   Recent Labs: 09/10/2016: ALT 16; BUN 16; Creatinine, Ser 0.66; Hemoglobin 13.2; Platelets 212.0; Potassium 4.7; Sodium 142 10/13/2016: TSH 1.180    Lipid Panel    Component Value Date/Time   CHOL 217 (H) 09/17/2016 1415   TRIG 155.0 (H) 09/17/2016 1415   HDL 47.20 09/17/2016 1415   CHOLHDL 5  09/17/2016 1415   VLDL 31.0 09/17/2016 1415   LDLCALC 138 (H) 09/17/2016 1415      Wt Readings from Last 3 Encounters:  10/13/16 156 lb (70.8 kg)  09/17/16 158 lb (71.7 kg)  07/16/16 150 lb 4 oz (68.2 kg)      Other studies Reviewed: Additional studies/ records that were reviewed today include: ***. Review of the above records demonstrates:  Please see elsewhere in the note.  ***   ASSESSMENT AND PLAN:  *** MARFAN'S SYNDROME:  ATRIAL FIB:    Current medicines are reviewed at length with the patient today.  The patient {ACTIONS; HAS/DOES NOT HAVE:19233} concerns regarding medicines.  The following changes have been made:  {PLAN; NO CHANGE:13088:s}  Labs/ tests ordered today include: *** No orders of the defined types were placed in this encounter.    Disposition:   FU with ***    Signed, Minus Breeding, MD  10/14/2016 1:02 PM    [facility]

## 2016-10-15 ENCOUNTER — Ambulatory Visit: Payer: 59 | Admitting: Cardiology

## 2016-10-20 ENCOUNTER — Ambulatory Visit (INDEPENDENT_AMBULATORY_CARE_PROVIDER_SITE_OTHER): Payer: 59 | Admitting: Physician Assistant

## 2016-10-20 ENCOUNTER — Encounter: Payer: Self-pay | Admitting: Physician Assistant

## 2016-10-20 VITALS — BP 122/80 | HR 92 | Temp 98.3°F | Resp 16 | Ht 75.0 in | Wt 158.0 lb

## 2016-10-20 DIAGNOSIS — Z30013 Encounter for initial prescription of injectable contraceptive: Secondary | ICD-10-CM | POA: Diagnosis not present

## 2016-10-20 DIAGNOSIS — F3160 Bipolar disorder, current episode mixed, unspecified: Secondary | ICD-10-CM | POA: Diagnosis not present

## 2016-10-20 LAB — POCT URINE PREGNANCY: Preg Test, Ur: NEGATIVE

## 2016-10-20 MED ORDER — MEDROXYPROGESTERONE ACETATE 150 MG/ML IM SUSP
150.0000 mg | Freq: Once | INTRAMUSCULAR | Status: AC
  Administered 2016-10-20: 150 mg via INTRAMUSCULAR

## 2016-10-20 NOTE — Progress Notes (Signed)
Patient presents to clinic today for follow-up of Bipolar disorder with mania. At last visit Seroquel was increased to 200 mg BID. Patient is taking as directed but noting significant somnolence with daytime dosing. Notes mood is improving however. Has appointment with counseling Karna Christmas) on 11/01/16. States she could not get a sooner appointment. Denies SI/HI. Is still dealing with the death of her sister and is having to go through her personal belongings which has been hard for her.   Past Medical History:  Diagnosis Date  . Anticoagulant long-term use    after valve surgery for Marfan's syndrome  . Anxiety   . Ascending aorta dilatation (HCC)   . Depression   . Marfan's syndrome    Dr. Atilano Median  . Migraine   . Mitral valve prolapse   . Palpitations   . Suicidal behavior    Tylenol Overdose  . Varicose veins     Current Outpatient Prescriptions on File Prior to Visit  Medication Sig Dispense Refill  . DULoxetine (CYMBALTA) 20 MG capsule Take 1 capsule (20 mg total) by mouth daily. 90 capsule 1  . OVER THE COUNTER MEDICATION Multivitamin/Iron/Folic Acid (Centrum Ultra Women's Oral)-Take 1 tablet by mouth daily.    . QUEtiapine (SEROQUEL) 100 MG tablet Take 200 mg by mouth twice daily 120 tablet 1  . warfarin (COUMADIN) 5 MG tablet Take 5 mg by mouth as directed.     No current facility-administered medications on file prior to visit.     No Known Allergies  Family History  Problem Relation Age of Onset  . Cancer Mother 1    Breast   . Marfan syndrome Father   . Heart disease Father   . Marfan syndrome Sister   . Marfan syndrome Paternal Grandmother   . Marfan syndrome Sister     Social History   Social History  . Marital status: Divorced    Spouse name: N/A  . Number of children: N/A  . Years of education: 12   Occupational History  . 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 Smoking status: Former Smoker   Packs/day: 2.00    Years: 18.00    Types: Cigarettes    Quit date: 04/10/2013  . Smokeless tobacco: Current User     Comment: Currently using e-cigarrettes to help her quit smoking   . Alcohol use No  . Drug use: No  . Sexual activity: Not Currently   Other Topics Concern  . 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  She stopped smoking cigarettes in 04/2013 and started using e-cigarettes to help her quit.  She has weaned herself down on the nicotine.     Alcohol Use:  None   Drug Use:  None   Diet:  Regular   Exercise:  None   Hobbies:  Reading, Traveling                Review of Systems - See HPI.  All other ROS are negative.  BP 122/80   Pulse 92   Temp 98.3 F (36.8 C) (Oral)   Resp 16  Ht 6' 3"  (1.905 m)   Wt 158 lb (71.7 kg)   SpO2 99%   BMI 19.75 kg/m   Physical Exam  Constitutional: She is oriented to person, place, and time and well-developed, well-nourished, and in no distress.  HENT:  Head: Normocephalic and atraumatic.  Eyes: Conjunctivae are normal.  Cardiovascular: Normal rate, regular rhythm, normal heart sounds and intact distal pulses.   Pulmonary/Chest: Effort normal and breath sounds normal. No respiratory distress. She has no wheezes. She has no rales. She exhibits no tenderness.  Neurological: She is alert and oriented to person, place, and time.  Skin: Skin is warm and dry. No rash noted.  Psychiatric: Her mood appears anxious. Her affect is not labile. She is not agitated. She expresses no homicidal and no suicidal ideation. She expresses no suicidal plans and no homicidal plans. She exhibits ordered thought content. She does not have a flat affect.  Vitals  reviewed.   Recent Results (from the past 2160 hour(s))  Valproic Acid level     Status: Abnormal   Collection Time: 09/10/16 12:03 PM  Result Value Ref Range   Valproic Acid Lvl <12.5 (L) 50.0 - 100.0 ug/mL    Comment: Result repeated and verified.  Comp Met (CMET)     Status: Abnormal   Collection Time: 09/10/16 12:03 PM  Result Value Ref Range   Sodium 142 135 - 145 mEq/L   Potassium 4.7 3.5 - 5.1 mEq/L   Chloride 111 96 - 112 mEq/L   CO2 27 19 - 32 mEq/L   Glucose, Bld 95 70 - 99 mg/dL   BUN 16 6 - 23 mg/dL   Creatinine, Ser 0.66 0.40 - 1.20 mg/dL   Total Bilirubin 0.6 0.2 - 1.2 mg/dL   Alkaline Phosphatase 35 (L) 39 - 117 U/L   AST 21 0 - 37 U/L   ALT 16 0 - 35 U/L   Total Protein 6.5 6.0 - 8.3 g/dL   Albumin 3.9 3.5 - 5.2 g/dL   Calcium 8.9 8.4 - 10.5 mg/dL   GFR 105.46 >60.00 mL/min  CBC     Status: Abnormal   Collection Time: 09/10/16 12:03 PM  Result Value Ref Range   WBC 7.0 4.0 - 10.5 K/uL   RBC 4.35 3.87 - 5.11 Mil/uL   Platelets 212.0 150.0 - 400.0 K/uL   Hemoglobin 13.2 12.0 - 15.0 g/dL   HCT 39.2 36.0 - 46.0 %   MCV 90.2 78.0 - 100.0 fl   MCHC 33.6 30.0 - 36.0 g/dL   RDW 16.2 (H) 11.5 - 15.5 %  Lipid panel     Status: Abnormal   Collection Time: 09/17/16  2:15 PM  Result Value Ref Range   Cholesterol 217 (H) 0 - 200 mg/dL    Comment: ATP III Classification       Desirable:  < 200 mg/dL               Borderline High:  200 - 239 mg/dL          High:  > = 240 mg/dL   Triglycerides 155.0 (H) 0.0 - 149.0 mg/dL    Comment: Normal:  <150 mg/dLBorderline High:  150 - 199 mg/dL   HDL 47.20 >39.00 mg/dL   VLDL 31.0 0.0 - 40.0 mg/dL   LDL Cholesterol 138 (H) 0 - 99 mg/dL   Total CHOL/HDL Ratio 5     Comment:  Men          Women1/2 Average Risk     3.4          3.3Average Risk          5.0          4.42X Average Risk          9.6          7.13X Average Risk          15.0          11.0                       NonHDL 169.31     Comment: NOTE:  Non-HDL  goal should be 30 mg/dL higher than patient's LDL goal (i.e. LDL goal of < 70 mg/dL, would have non-HDL goal of < 100 mg/dL)  TSH     Status: None   Collection Time: 10/13/16  4:18 PM  Result Value Ref Range   TSH 1.180 0.450 - 4.500 uIU/mL    Assessment/Plan:  Bipolar disorder, mixed (HCC) BID seroquel oversedating. Will start 300 mg QHS. Continue Cymbalta. Spoke with [place]. Counseling appointment moved up to tomorrow at 4 PM. Will keep close follow-up with patient.    Leeanne Rio, PA-C

## 2016-10-20 NOTE — Patient Instructions (Addendum)
Please go back to the Seroquel 300 mg at night since the twice daily dosing is too sedating for you. Do this for the next couple of days and let me know how you do. We may try to add-on another medication to this regimen.   Continue the Cymbalta as directed.   I have contacted Terri to attempt to get a faster appointment for you. I will call as soon as I have results.

## 2016-10-20 NOTE — Progress Notes (Signed)
Pre visit review using our clinic review tool, if applicable. No additional management support is needed unless otherwise documented below in the visit note. 

## 2016-10-21 ENCOUNTER — Ambulatory Visit: Payer: 59 | Admitting: Psychology

## 2016-10-22 MED ORDER — CLONAZEPAM 0.5 MG PO TABS: 0.5000 mg | ORAL_TABLET | Freq: Two times a day (BID) | ORAL | 0 refills | Status: DC | PRN

## 2016-10-28 ENCOUNTER — Encounter: Payer: Self-pay | Admitting: Physician Assistant

## 2016-11-01 ENCOUNTER — Ambulatory Visit (INDEPENDENT_AMBULATORY_CARE_PROVIDER_SITE_OTHER): Payer: 59 | Admitting: Psychology

## 2016-11-01 ENCOUNTER — Ambulatory Visit: Payer: 59 | Admitting: Psychology

## 2016-11-01 ENCOUNTER — Encounter: Payer: Self-pay | Admitting: Physician Assistant

## 2016-11-01 DIAGNOSIS — F4321 Adjustment disorder with depressed mood: Secondary | ICD-10-CM | POA: Diagnosis not present

## 2016-11-01 DIAGNOSIS — F3111 Bipolar disorder, current episode manic without psychotic features, mild: Secondary | ICD-10-CM

## 2016-11-03 ENCOUNTER — Other Ambulatory Visit: Payer: Self-pay | Admitting: Physician Assistant

## 2016-11-03 DIAGNOSIS — F3110 Bipolar disorder, current episode manic without psychotic features, unspecified: Secondary | ICD-10-CM

## 2016-11-03 MED ORDER — OLANZAPINE 10 MG PO TABS: 10.0000 mg | ORAL_TABLET | Freq: Every day | ORAL | 1 refills | Status: DC

## 2016-11-03 NOTE — Addendum Note (Signed)
Addended by: Raiford Noble on: 11/03/2016 04:21 PM   Modules accepted: Orders

## 2016-11-05 ENCOUNTER — Encounter: Payer: Self-pay | Admitting: Physician Assistant

## 2016-11-08 ENCOUNTER — Encounter: Payer: Self-pay | Admitting: Physician Assistant

## 2016-11-10 ENCOUNTER — Encounter: Payer: Self-pay | Admitting: Physician Assistant

## 2016-11-11 ENCOUNTER — Encounter: Payer: Self-pay | Admitting: Physician Assistant

## 2016-11-11 ENCOUNTER — Encounter (HOSPITAL_COMMUNITY): Payer: Self-pay

## 2016-11-11 ENCOUNTER — Ambulatory Visit (INDEPENDENT_AMBULATORY_CARE_PROVIDER_SITE_OTHER): Payer: 59 | Admitting: Physician Assistant

## 2016-11-11 VITALS — BP 120/82 | HR 95 | Temp 98.6°F | Resp 14 | Ht 75.0 in | Wt 159.0 lb

## 2016-11-11 DIAGNOSIS — F3160 Bipolar disorder, current episode mixed, unspecified: Secondary | ICD-10-CM | POA: Diagnosis not present

## 2016-11-11 MED ORDER — TRAZODONE HCL 50 MG PO TABS: 25.0000 mg | ORAL_TABLET | Freq: Every evening | ORAL | 1 refills | Status: DC | PRN

## 2016-11-11 NOTE — Patient Instructions (Signed)
Please continue the Olanzapine and Cymbalta as directed. Take the Trazodone in the evening as directed (1/2 tablet) to help with sleep.  Let me know how you are doing over the weekend. Please follow-up with Terri as scheduled. Also Cardiology. Please let me know when you find out when you appointment is with Psychiatry.   Follow-up with me in 1 month.

## 2016-11-11 NOTE — Progress Notes (Signed)
Patient presents to clinic today for follow-up of Bipolar Disorder mixed with manic episodes. Patient recently taken off of Seroquel as dose was sub-therapeutic and higher doses were causing significant somnolence. Patient was switched to Zyprexa 10 mg. Is taking as directed without side effect. Notes improvement in manic symptoms. States she has stopped manic shopping. Denies panic attack. Denies SI/HI. Does note insomnia after stopping the Seroquel. Is interacting with friends more. Still working on getting car fixed.  Loves to read and has been doing more of this. Patient is following up with Terri for counseling. Has appointment scheduled for next Wednesday with Psychiatry.   Past Medical History:  Diagnosis Date  . Anticoagulant long-term use    after valve surgery for Marfan's syndrome  . Anxiety   . Ascending aorta dilatation (HCC)   . Depression   . Marfan's syndrome    Dr. Atilano Median  . Migraine   . Mitral valve prolapse   . Palpitations   . Suicidal behavior    Tylenol Overdose  . Varicose veins     Current Outpatient Prescriptions on File Prior to Visit  Medication Sig Dispense Refill  . DULoxetine (CYMBALTA) 20 MG capsule Take 1 capsule (20 mg total) by mouth daily. 90 capsule 1  . medroxyPROGESTERone (DEPO-PROVERA) 150 MG/ML injection Inject 150 mg into the muscle every 3 (three) months.    Marland Kitchen OLANZapine (ZYPREXA) 10 MG tablet Take 1 tablet (10 mg total) by mouth at bedtime. 30 tablet 1  . OVER THE COUNTER MEDICATION Multivitamin/Iron/Folic Acid (Centrum Ultra Women's Oral)-Take 1 tablet by mouth daily.    Marland Kitchen warfarin (COUMADIN) 5 MG tablet Take 5 mg by mouth as directed.     No current facility-administered medications on file prior to visit.     No Known Allergies  Family History  Problem Relation Age of Onset  . Cancer Mother 9    Breast   . Marfan syndrome Father   . Heart disease Father   . Marfan syndrome Sister   . Marfan syndrome Paternal Grandmother   .  Marfan syndrome Sister     Social History   Social History  . Marital status: Divorced    Spouse name: N/A  . Number of children: N/A  . Years of education: 61   Occupational History  . 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 Smoking status: Former Smoker    Packs/day: 2.00    Years: 18.00    Types: Cigarettes    Quit date: 04/10/2013  . Smokeless tobacco: Current User     Comment: Currently using e-cigarrettes to help her quit smoking   . Alcohol use No  . Drug use: No  . Sexual activity: Not Currently   Other Topics Concern  . 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  She stopped smoking cigarettes in 04/2013 and started using e-cigarettes to help her quit.  She has weaned herself down on the nicotine.     Alcohol Use:  None   Drug Use:  None   Diet:  Regular   Exercise:  None   Hobbies:  Reading, Traveling                 Review of Systems - See HPI.  All other ROS are negative.  BP 120/82   Pulse 95   Temp 98.6 F (37 C) (Oral)   Resp 14   Ht 6' 3"  (1.905 m)   Wt 159 lb (72.1 kg)   SpO2 99%   BMI 19.87 kg/m   Physical Exam  Constitutional: She is oriented to person, place, and time and well-developed, well-nourished, and in no distress.  HENT:  Head: Normocephalic and atraumatic.  Eyes: Conjunctivae are normal.  Neck: Neck supple.  Cardiovascular: Normal rate, regular rhythm, normal heart sounds and intact distal pulses.   Pulmonary/Chest: Effort normal and breath sounds normal. No respiratory distress. She has no wheezes. She has no rales. She exhibits no tenderness.  Neurological: She is alert and oriented to person, place, and  time.  Skin: Skin is warm and dry. No rash noted.  Psychiatric: Affect normal.  Vitals reviewed.   Recent Results (from the past 2160 hour(s))  Valproic Acid level     Status: Abnormal   Collection Time: 09/10/16 12:03 PM  Result Value Ref Range   Valproic Acid Lvl <12.5 (L) 50.0 - 100.0 ug/mL    Comment: Result repeated and verified.  Comp Met (CMET)     Status: Abnormal   Collection Time: 09/10/16 12:03 PM  Result Value Ref Range   Sodium 142 135 - 145 mEq/L   Potassium 4.7 3.5 - 5.1 mEq/L   Chloride 111 96 - 112 mEq/L   CO2 27 19 - 32 mEq/L   Glucose, Bld 95 70 - 99 mg/dL   BUN 16 6 - 23 mg/dL   Creatinine, Ser 0.66 0.40 - 1.20 mg/dL   Total Bilirubin 0.6 0.2 - 1.2 mg/dL   Alkaline Phosphatase 35 (L) 39 - 117 U/L   AST 21 0 - 37 U/L   ALT 16 0 - 35 U/L   Total Protein 6.5 6.0 - 8.3 g/dL   Albumin 3.9 3.5 - 5.2 g/dL   Calcium 8.9 8.4 - 10.5 mg/dL   GFR 105.46 >60.00 mL/min  CBC     Status: Abnormal   Collection Time: 09/10/16 12:03 PM  Result Value Ref Range   WBC 7.0 4.0 - 10.5 K/uL   RBC 4.35 3.87 - 5.11 Mil/uL   Platelets 212.0 150.0 - 400.0 K/uL   Hemoglobin 13.2 12.0 - 15.0 g/dL   HCT 39.2 36.0 - 46.0 %   MCV 90.2 78.0 - 100.0 fl   MCHC 33.6 30.0 - 36.0 g/dL   RDW 16.2 (H) 11.5 - 15.5 %  Lipid panel     Status: Abnormal   Collection Time: 09/17/16  2:15 PM  Result Value Ref Range   Cholesterol 217 (H) 0 - 200 mg/dL    Comment: ATP III Classification       Desirable:  < 200 mg/dL               Borderline High:  200 - 239 mg/dL          High:  > = 240 mg/dL   Triglycerides 155.0 (H) 0.0 - 149.0 mg/dL    Comment: Normal:  <150 mg/dLBorderline High:  150 - 199 mg/dL   HDL 47.20 >39.00 mg/dL   VLDL 31.0 0.0 - 40.0 mg/dL   LDL Cholesterol 138 (H) 0 - 99 mg/dL   Total CHOL/HDL Ratio 5     Comment:  Men          Women1/2 Average Risk     3.4          3.3Average Risk          5.0          4.42X Average Risk          9.6          7.13X Average Risk           15.0          11.0                       NonHDL 169.31     Comment: NOTE:  Non-HDL goal should be 30 mg/dL higher than patient's LDL goal (i.e. LDL goal of < 70 mg/dL, would have non-HDL goal of < 100 mg/dL)  TSH     Status: None   Collection Time: 10/13/16  4:18 PM  Result Value Ref Range   TSH 1.180 0.450 - 4.500 uIU/mL  POCT urine pregnancy     Status: Normal   Collection Time: 10/20/16  1:32 PM  Result Value Ref Range   Preg Test, Ur Negative Negative   Assessment/Plan: Bipolar disorder, mixed Doing well on zyprexa. Will continue until assessment with Psychiatry. For sleep, will start Trazodone 25 mg nightly. FU scheduled.    Leeanne Rio, PA-C

## 2016-11-11 NOTE — Progress Notes (Signed)
Pre visit review using our clinic review tool, if applicable. No additional management support is needed unless otherwise documented below in the visit note. 

## 2016-11-12 NOTE — Assessment & Plan Note (Signed)
Doing well on zyprexa. Will continue until assessment with Psychiatry. For sleep, will start Trazodone 25 mg nightly. FU scheduled.

## 2016-11-14 NOTE — Progress Notes (Signed)
Cardiology Office Note   Date:  11/15/2016   ID:  Melissa Hayden, DOB 10-25-1975, MRN [id number]  PCP:  Leeanne Rio, PA-C  Cardiologist:   Minus Breeding, MD  Referring:  Leeanne Rio, PA-C  Chief Complaint  Patient presents with  . Marfan Syndrome      History of Present Illness: Melissa Hayden is a 41 y.o. female who presents for Follow-up of Marfan's. The patient had a known history of Marfan's. In 2015 because of increasing dyspnea and a 5.6 cm aortic root with an ejection fraction of 35% and severe MR on echo at the age of 64 she had surgery by Drs Ysidro Evert and [place] at [facility].   Of note at the time of preoperative evaluation for left and right heart cath she was not found to have any coronary disease.  She ultimately had aortic valve conduit root replacement with coronary reconstruction.(button Bentall) she had a #23 mm St. Jude history of present illness mechanical aortic valve replacement and a #25 St. Jude expanded cuff mechanical mitral valve replacement.   Aortic Valved Conduit root replacement with coronary reconstruction (button Bentall procedure); #29 mm St. Jude HP mechanical valved conduit . I was able to review these records on Care Everywhere.  She says she's only had one follow-up since that time. She has been on Coumadin and have this followed. She is switching her cardiology care here.  She's done well from a cardiovascular standpoint. She feels a little fatigued. However, she denies any cardiovascular symptoms such as chest pressure, neck or arm discomfort. She doesn't really notice any palpitations, presyncope or syncope. She has no PND or orthopnea. She's had no weight gain or edema. She walks 5 dogs but she doesn't exercise routinely. She has a Network engineer job.         Past Medical History:  Diagnosis Date  . Anticoagulant long-term use    after valve surgery for Marfan's syndrome  . Anxiety   . Ascending aorta dilatation (HCC)   . Depression   .  Marfan's syndrome    Dr. Atilano Median  . Migraine   . Mitral valve prolapse   . Palpitations   . Suicidal behavior    Tylenol Overdose  . Varicose veins     Past Surgical History:  Procedure Laterality Date  . ANKLE SURGERY Left   . BREAST CYST EXCISION    . CARDIAC VALVE SURGERY  09/2014   AV, MV replacement, aortic root replacement (Dr Ysidro Evert at [facility])  . INNER EAR SURGERY Right      Current Outpatient Prescriptions  Medication Sig Dispense Refill  . DULoxetine (CYMBALTA) 20 MG capsule Take 1 capsule (20 mg total) by mouth daily. 90 capsule 1  . medroxyPROGESTERone (DEPO-PROVERA) 150 MG/ML injection Inject 150 mg into the muscle every 3 (three) months.    . Melatonin 10 MG TABS Take 4 tablets by mouth at bedtime.    Marland Kitchen OLANZapine (ZYPREXA) 10 MG tablet Take 1 tablet (10 mg total) by mouth at bedtime. 30 tablet 1  . OVER THE COUNTER MEDICATION Multivitamin/Iron/Folic Acid (Centrum Ultra Women's Oral)-Take 1 tablet by mouth daily.    . traZODone (DESYREL) 50 MG tablet Take 0.5 tablets (25 mg total) by mouth at bedtime as needed for sleep. 30 tablet 1  . warfarin (COUMADIN) 5 MG tablet Take 5 mg by mouth as directed.     No current facility-administered medications for this visit.     Allergies:   Patient has  no known allergies.    Social History:  The patient  reports that she quit smoking about 3 years ago. Her smoking use included Cigarettes. She has a 36.00 pack-year smoking history. She uses smokeless tobacco. She reports that she does not drink alcohol or use drugs.   Family History:  The patient's family history is very remarkable. The index case appears to be her paternal grandmother who died from complications of Marfan's. She had one paternal aunt who did not have the disease. She had another that did have Marfan's but died of a myocardial infarction. She had a maternal uncle who had Marfan's and died from the disease. Her father has Marfan's and is alive. She has a sister who  has Marfan's and died of suicide. That sister has a daughter who died as a complication of premature birth who apparently had the disease. There is a living nephew from that sister who has the disease. Another sister had Marfan's and died of drug overdose. That sister has a son and daughter both of whom have the disease. The patient is married but has no children.  ROS:  Please see the history of present illness.   Otherwise, review of systems are positive for none.   All other systems are reviewed and negative.    PHYSICAL EXAM: VS:  BP 110/66   Pulse 76   Ht 6\' 2"  (1.88 m)   Wt 159 lb 9.6 oz (72.4 kg)   BMI 20.49 kg/m  , BMI Body mass index is 20.49 kg/m. GENERAL:  Well appearing HEENT:  Pupils equal round and reactive, fundi not visualized, oral mucosa unremarkable NECK:  No jugular venous distention, waveform within normal limits, carotid upstroke brisk and symmetric, no bruits, no thyromegaly LYMPHATICS:  No cervical, inguinal adenopathy LUNGS:  Clear to auscultation bilaterally BACK:  No CVA tenderness CHEST:  Healed surgical scar and pectus carinatum HEART:  PMI not displaced or sustained,mechanical S1 and S2 within normal limits, no S3, no S4, no clicks, no rubs,  murmurs ABD:  Flat, positive bowel sounds normal in frequency in pitch, no bruits, no rebound, no guarding, no midline pulsatile mass, no hepatomegaly, no splenomegaly EXT:  2 plus pulses throughout, no edema, no cyanosis no clubbing, arachnodactyly  SKIN:  No rashes no nodules NEURO:  Cranial nerves II through XII grossly intact, motor grossly intact throughout PSYCH:  Cognitively intact, oriented to person place and time    EKG:  EKG is not ordered today. The ekg ordered today demonstrates sinus rhythm, rate 84, axis within normal limits, intervals within normal limits, premature ventricular contractions, no acute ST-T wave changes.  Recent Labs: 09/10/2016: ALT 16; BUN 16; Creatinine, Ser 0.66; Hemoglobin 13.2;  Platelets 212.0; Potassium 4.7; Sodium 142 10/13/2016: TSH 1.180    Lipid Panel    Component Value Date/Time   CHOL 217 (H) 09/17/2016 1415   TRIG 155.0 (H) 09/17/2016 1415   HDL 47.20 09/17/2016 1415   CHOLHDL 5 09/17/2016 1415   VLDL 31.0 09/17/2016 1415   LDLCALC 138 (H) 09/17/2016 1415      Wt Readings from Last 3 Encounters:  11/15/16 159 lb 9.6 oz (72.4 kg)  11/11/16 159 lb (72.1 kg)  10/20/16 158 lb (71.7 kg)      Other studies Reviewed: Additional studies/ records that were reviewed today include: Extensive Care Everywhere records. Review of the above records demonstrates:  Please see elsewhere in the note.     ASSESSMENT AND PLAN:  AVR/MVR Aortic Root Replacement:  I have reviewed the patient's records and will begin follow-up with a CT angiogram. I will also refer her to be seen by Dr. Servando Snare in the aortotomy the clinic. We talked at length about good dental hygiene.  Cm:  The patient was reported to have a reduced ejection fraction at the time of her surgery. I'll likely follow-up with an echocardiogram.   Current medicines are reviewed at length with the patient today.  The patient does not have concerns regarding medicines.  The following changes have been made:  no change  Labs/ tests ordered today include:   Orders Placed This Encounter  Procedures  . CT ANGIO CHEST AORTA W &/OR WO CONTRAST  . Ambulatory referral to Cardiothoracic Surgery     Disposition:   FU with me in 3 months.    Signed, Minus Breeding, MD  11/15/2016 1:14 PM    [facility]

## 2016-11-15 ENCOUNTER — Ambulatory Visit: Payer: 59 | Admitting: Psychology

## 2016-11-15 ENCOUNTER — Ambulatory Visit (INDEPENDENT_AMBULATORY_CARE_PROVIDER_SITE_OTHER): Payer: 59 | Admitting: Cardiology

## 2016-11-15 ENCOUNTER — Encounter: Payer: Self-pay | Admitting: Cardiology

## 2016-11-15 VITALS — BP 110/66 | HR 76 | Ht 74.0 in | Wt 159.6 lb

## 2016-11-15 DIAGNOSIS — Q8740 Marfan's syndrome, unspecified: Secondary | ICD-10-CM | POA: Diagnosis not present

## 2016-11-15 DIAGNOSIS — Z952 Presence of prosthetic heart valve: Secondary | ICD-10-CM | POA: Diagnosis not present

## 2016-11-15 DIAGNOSIS — Z9889 Other specified postprocedural states: Secondary | ICD-10-CM

## 2016-11-15 NOTE — Patient Instructions (Addendum)
Medication Instructions:  Continue current medcations  Labwork: None Ordered  Testing/Procedures: Your physician has requested that you have a Chest CTA  Follow-Up: You have been referred to Dr Gerrie Nordmann in 3 Months  Your physician wants you to follow-up in: 6 Months. You will receive a reminder letter in the mail two months in advance. If you don't receive a letter, please call our office to schedule the follow-up appointment.    Any Other Special Instructions Will Be Listed Below (If Applicable).   If you need a refill on your cardiac medications before your next appointment, please call your pharmacy.

## 2016-11-16 ENCOUNTER — Encounter: Payer: Self-pay | Admitting: Physician Assistant

## 2016-11-17 ENCOUNTER — Encounter: Payer: Self-pay | Admitting: Physician Assistant

## 2016-11-17 ENCOUNTER — Ambulatory Visit (INDEPENDENT_AMBULATORY_CARE_PROVIDER_SITE_OTHER): Payer: 59 | Admitting: Psychiatry

## 2016-11-17 ENCOUNTER — Encounter (HOSPITAL_COMMUNITY): Payer: Self-pay | Admitting: Psychiatry

## 2016-11-17 VITALS — BP 126/68 | HR 72 | Ht 74.0 in | Wt 159.0 lb

## 2016-11-17 DIAGNOSIS — Z79899 Other long term (current) drug therapy: Secondary | ICD-10-CM

## 2016-11-17 DIAGNOSIS — Z803 Family history of malignant neoplasm of breast: Secondary | ICD-10-CM | POA: Diagnosis not present

## 2016-11-17 DIAGNOSIS — Z9889 Other specified postprocedural states: Secondary | ICD-10-CM

## 2016-11-17 DIAGNOSIS — Z8489 Family history of other specified conditions: Secondary | ICD-10-CM

## 2016-11-17 DIAGNOSIS — F3162 Bipolar disorder, current episode mixed, moderate: Secondary | ICD-10-CM | POA: Diagnosis not present

## 2016-11-17 DIAGNOSIS — F17210 Nicotine dependence, cigarettes, uncomplicated: Secondary | ICD-10-CM

## 2016-11-17 MED ORDER — TRAZODONE HCL 50 MG PO TABS: 50.0000 mg | ORAL_TABLET | Freq: Every evening | ORAL | 0 refills | Status: DC | PRN

## 2016-11-17 MED ORDER — OLANZAPINE 15 MG PO TABS
15.0000 mg | ORAL_TABLET | Freq: Every day | ORAL | 0 refills | Status: DC
Start: 2016-11-17 — End: 2016-12-14
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
## 2016-11-23 ENCOUNTER — Inpatient Hospital Stay: Admission: RE | Admit: 2016-11-23 | Payer: Self-pay | Source: Ambulatory Visit

## 2016-11-23 ENCOUNTER — Encounter (HOSPITAL_COMMUNITY): Payer: Self-pay | Admitting: Psychiatry

## 2016-11-25 ENCOUNTER — Other Ambulatory Visit: Payer: Self-pay | Admitting: Physician Assistant

## 2016-11-25 ENCOUNTER — Encounter: Payer: Self-pay | Admitting: Physician Assistant

## 2016-11-25 DIAGNOSIS — M25551 Pain in right hip: Secondary | ICD-10-CM

## 2016-11-25 MED ORDER — DULOXETINE HCL 20 MG PO CPEP: 20.0000 mg | ORAL_CAPSULE | Freq: Every day | ORAL | 0 refills | Status: DC

## 2016-12-01 ENCOUNTER — Encounter: Payer: 59 | Admitting: Cardiothoracic Surgery

## 2016-12-02 ENCOUNTER — Other Ambulatory Visit: Payer: Self-pay

## 2016-12-06 ENCOUNTER — Ambulatory Visit (INDEPENDENT_AMBULATORY_CARE_PROVIDER_SITE_OTHER)
Admission: RE | Admit: 2016-12-06 | Discharge: 2016-12-06 | Disposition: A | Discharge status: Home / Self Care | Payer: 59 | Source: Ambulatory Visit | Attending: Cardiology | Admitting: Cardiology

## 2016-12-06 DIAGNOSIS — Z952 Presence of prosthetic heart valve: Secondary | ICD-10-CM | POA: Diagnosis not present

## 2016-12-06 MED ORDER — IOPAMIDOL (ISOVUE-370) INJECTION 76%
100.0000 mL | Freq: Once | INTRAVENOUS | Status: AC | PRN
  Administered 2016-12-06: 100 mL via INTRAVENOUS

## 2016-12-07 ENCOUNTER — Encounter: Payer: 59 | Admitting: Cardiothoracic Surgery

## 2016-12-07 NOTE — Progress Notes (Signed)
error 

## 2016-12-07 NOTE — Progress Notes (Signed)
This encounter was created in error - please disregard.

## 2016-12-10 ENCOUNTER — Ambulatory Visit (HOSPITAL_COMMUNITY): Payer: Self-pay | Admitting: Psychiatry

## 2016-12-14 ENCOUNTER — Other Ambulatory Visit (HOSPITAL_COMMUNITY): Payer: Self-pay | Admitting: Psychiatry

## 2016-12-14 ENCOUNTER — Other Ambulatory Visit (HOSPITAL_COMMUNITY): Payer: Self-pay

## 2016-12-14 DIAGNOSIS — F3162 Bipolar disorder, current episode mixed, moderate: Secondary | ICD-10-CM

## 2016-12-14 MED ORDER — OLANZAPINE 15 MG PO TABS: 15.0000 mg | ORAL_TABLET | Freq: Every day | ORAL | 0 refills | Status: DC

## 2016-12-14 MED ORDER — TRAZODONE HCL 50 MG PO TABS: 50.0000 mg | ORAL_TABLET | Freq: Every evening | ORAL | 0 refills | Status: DC | PRN

## 2016-12-15 ENCOUNTER — Other Ambulatory Visit (HOSPITAL_COMMUNITY): Payer: Self-pay | Admitting: Psychiatry

## 2016-12-15 DIAGNOSIS — F3162 Bipolar disorder, current episode mixed, moderate: Secondary | ICD-10-CM

## 2016-12-19 ENCOUNTER — Other Ambulatory Visit (HOSPITAL_COMMUNITY): Payer: Self-pay | Admitting: Psychiatry

## 2016-12-19 DIAGNOSIS — F3162 Bipolar disorder, current episode mixed, moderate: Secondary | ICD-10-CM

## 2016-12-21 ENCOUNTER — Ambulatory Visit (INDEPENDENT_AMBULATORY_CARE_PROVIDER_SITE_OTHER): Payer: 59 | Admitting: Psychiatry

## 2016-12-21 ENCOUNTER — Encounter (HOSPITAL_COMMUNITY): Payer: Self-pay | Admitting: Psychiatry

## 2016-12-21 DIAGNOSIS — F3162 Bipolar disorder, current episode mixed, moderate: Secondary | ICD-10-CM

## 2016-12-21 DIAGNOSIS — F17210 Nicotine dependence, cigarettes, uncomplicated: Secondary | ICD-10-CM | POA: Diagnosis not present

## 2016-12-21 DIAGNOSIS — F1921 Other psychoactive substance dependence, in remission: Secondary | ICD-10-CM

## 2016-12-21 MED ORDER — HALOPERIDOL 5 MG PO TABS: 5.0000 mg | ORAL_TABLET | Freq: Every day | ORAL | 1 refills | Status: DC

## 2016-12-21 MED ORDER — OLANZAPINE 15 MG PO TABS: 15.0000 mg | ORAL_TABLET | Freq: Every day | ORAL | 0 refills | Status: DC

## 2016-12-21 MED ORDER — TRAZODONE HCL 50 MG PO TABS: 50.0000 mg | ORAL_TABLET | Freq: Every evening | ORAL | 0 refills | Status: DC | PRN

## 2016-12-21 NOTE — Progress Notes (Signed)
[place] MD/PA/NP OP Progress Note  12/21/2016 1:39 PM MADD[facility] FIRST  MRN:  [id number]  Chief Complaint:  Chief Complaint    Follow-up     Subjective:  I like increase Zyprexa.  I'm sleeping better but I lost my job.  I cannot afford Zyprexa.  HPI: Melissa Hayden came for her follow-up appointment.  She is 41 year old Caucasian married female who was seen first time 5 weeks ago.  She was referred from primary care physician for the management of her bipolar disorder.  She endorse multiple stressors, her sister committed suicide in December and 2 weeks later her husband relapsed into drugs and currently he is homeless.  She is feeling better with increase Zyprexa.  She is sleeping 7-8 hours.  She feel her racing thoughts irritability mania is much better.  However she lost her job last month due to multiple missing days.  She also endorsed lack of transportation was also a major contributor to losing her job.  She had applied other places and so far she has no luck.  She is very concerned about medication cost.  She cannot afford Zyprexa and wondering if she can try a different medication.  She also in a process of getting patient's assistance program from [employer].  She is taking trazodone which is helping her.  She denies any irritability, anger or any hallucination.  She has no tremors or shakes.  She gained weight but she is happy because she like to gain weight.  She has no physical contact with her husband but she mentioned that he had a message few times on the Facebook.  Patient denies any suicidal thoughts or any hallucinations.  She is actively looking for a job.  She is using her savings and realized that she cannot support herself longer.  She is scheduled to see therapist but due to financial reasons she has been unable to see her.  Patient has no tremors, shakes, EPS or any side effects.  Patient denies drinking alcohol or using any illegal substances.  Patient lives by herself.  She has no children.   Her parents are deceased.  She has 2 aunts.  Patient has no contact with them.   Visit Diagnosis:    ICD-9-CM ICD-10-CM   1. Bipolar 1 disorder, mixed, moderate (HCC) 296.62 F31.62 traZODone (DESYREL) 50 MG tablet     OLANZapine (ZYPREXA) 15 MG tablet    Past Psychiatric History:  Patient has history of bipolar disorder.  She was admitted to [facility] 10 years ago after taking overdose on Tylenol PM.  At that time her mother died and she was very depressed.  Patient endorse history of mood swing, irritability, mania and impulsive behavior.  She admitted excessive buying, shopping and labile mood.  She also endorse history of heavy drinking and using pain medication but claims to be sober for more than 8 years.  In the past she had tried Abilify, Lexapro, Depakote and Seroquel.  She had a good response with Depakote but she could not afford it.  Past Medical History:  Past Medical History:  Diagnosis Date  . Anticoagulant long-term use    after valve surgery for Marfan's syndrome  . Anxiety   . Ascending aorta dilatation (HCC)   . Bipolar disorder ([place])   . Depression   . Marfan's syndrome    Dr. Atilano Median  . Migraine   . Mitral valve prolapse   . Palpitations   . Suicidal behavior    Tylenol Overdose  .  Varicose veins     Past Surgical History:  Procedure Laterality Date  . ANKLE SURGERY Left   . BREAST CYST EXCISION    . CARDIAC VALVE SURGERY  09/2014   AV, MV replacement, aortic root replacement (Dr Ysidro Evert at [facility])  . INNER EAR SURGERY Right     Family Psychiatric History: Reviewed.   Family History:  Family History  Problem Relation Age of Onset  . Cancer Mother 32    Breast   . Marfan syndrome Father   . Heart disease Father   . Marfan syndrome Sister   . Marfan syndrome Paternal Grandmother   . Marfan syndrome Sister     Social History:  Social History   Social History  . Marital status: Divorced    Spouse name: N/A  . Number of children: N/A  . Years  of education: 62   Occupational History  . [employer]    Social History Main Topics  . Smoking status: Current Every Day Smoker    Packs/day: 2.00    Years: 18.00    Types: Cigarettes    Last attempt to quit: 04/10/2013  . Smokeless tobacco: Current User     Comment: Currently using e-cigarrettes to help her quit smoking   . Alcohol use No  . Drug use: No  . Sexual activity: Not Currently   Other Topics Concern  . 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  Off and on    Alcohol Use:  None   Drug Use:  None   Diet:  Regular   Exercise:  None   Hobbies:  Reading, Traveling                 Allergies: No Known Allergies  Metabolic Disorder Labs: No results found for: HGBA1C, MPG No results found for: PROLACTIN Lab Results  Component Value Date   CHOL 217 (H) 09/17/2016   TRIG 155.0 (H) 09/17/2016   HDL 47.20 09/17/2016   CHOLHDL 5 09/17/2016   VLDL 31.0 09/17/2016   LDLCALC 138 (H) 09/17/2016     Current Medications: Current Outpatient Prescriptions  Medication Sig Dispense Refill  . DULoxetine (CYMBALTA) 20 MG capsule Take 1 capsule (20 mg total) by mouth daily. 90 capsule 0  . haloperidol (HALDOL) 5 MG tablet Take 1 tablet (5 mg total) by mouth at bedtime. 30 tablet 1  . medroxyPROGESTERone (DEPO-PROVERA) 150 MG/ML injection Inject 150 mg into the muscle every 3 (three) months.    . Melatonin 10 MG TABS Take 4 tablets by mouth at bedtime.    Marland Kitchen OLANZapine (ZYPREXA) 15 MG tablet Take 1 tablet (15 mg total) by mouth at bedtime. 90 tablet 0  . OVER THE COUNTER MEDICATION Multivitamin/Iron/Folic Acid (Centrum Ultra Women's Oral)-Take 1 tablet by mouth daily.    . traZODone (DESYREL) 50 MG tablet Take 1 tablet (50 mg total) by mouth at bedtime as needed for sleep. 90 tablet 0  . warfarin (COUMADIN) 5 MG  tablet Take 5 mg by mouth as directed.     No current facility-administered medications for this visit.     Neurologic: Headache: Yes Seizure: No Paresthesias: No  Musculoskeletal: Strength & Muscle Tone: within normal limits Gait & Station: normal Patient leans: N/A  Psychiatric Specialty Exam: Review  of Systems  Constitutional: Negative for weight loss.  HENT: Negative.   Cardiovascular: Negative.   Musculoskeletal: Positive for joint pain.  Skin: Negative.   Neurological: Positive for headaches. Negative for tremors.  Psychiatric/Behavioral: The patient is nervous/anxious.     Blood pressure 126/74, pulse 94, height 6\' 2"  (1.88 m), weight 173 lb (78.5 kg).Body mass index is 22.21 kg/m.  General Appearance: Casual, Fairly Groomed and tall  Eye Contact:  Good  Speech:  Clear and Coherent  Volume:  Normal  Mood:  Anxious  Affect:  Congruent  Thought Process:  Goal Directed  Orientation:  Full (Time, Place, and Person)  Thought Content: Logical   Suicidal Thoughts:  No  Homicidal Thoughts:  No  Memory:  Immediate;   Fair Recent;   Good Remote;   Good  Judgement:  Good  Insight:  Good  Psychomotor Activity:  Normal  Concentration:  Concentration: Fair and Attention Span: Fair  Recall:  Good  Fund of Knowledge: Good  Language: Good  Akathisia:  No  Handed:  Right  AIMS (if indicated):  0  Assets:  Communication Skills Desire for Improvement  ADL's:  Intact  Cognition: WNL  Sleep:  good    Assessment: Bipolar disorder, mixed.  Substance dependence in complete remission.  Plan: Patient is doing better on increase Zyprexa however she lost her job and could not afford it.  She like to get medicine that she can afford.  We will try Haldol 5 mg at bedtime .  She is also a process of getting Zyprexa through patient's assistance program .  We will complete the paperwork .  Discussed medication side effects and benefits.  Discuss metabolic syndrome from Zyprexa and  EPS from Haldol.  Patient had gained weight from the past and I encourage her to watch her calorie intake and to regular exercise.  Patient will call us if she develop any tremors or shakes from the Haldol.  At this time she cannot afford counseling but she is actively looking for a job and she will resume therapy once she has financial stability.  Discuss in length psychosocial stressors .  Recommended to call us back if she has any question or any concern.  Continue trazodone 50 mg at bedtime.  She is getting Cymbalta from her primary care physician.  Follow-up in 2 months.  Discuss safety plan that anytime having active suicidal thoughts or homicidal thoughts and she need to call 911 or go to the local emergency room.    Dwaine Pringle T., MD 12/21/2016, 1:39 PM

## 2017-02-17 ENCOUNTER — Encounter: Payer: Self-pay | Admitting: Cardiothoracic Surgery

## 2017-02-21 ENCOUNTER — Ambulatory Visit (HOSPITAL_COMMUNITY): Payer: Self-pay | Admitting: Psychiatry

## 2017-02-25 ENCOUNTER — Other Ambulatory Visit (HOSPITAL_COMMUNITY): Payer: Self-pay

## 2017-02-25 MED ORDER — HALOPERIDOL 5 MG PO TABS: 5.0000 mg | ORAL_TABLET | Freq: Every day | ORAL | 0 refills | Status: DC

## 2017-03-01 ENCOUNTER — Ambulatory Visit (HOSPITAL_COMMUNITY): Payer: Self-pay | Admitting: Psychiatry

## 2017-03-09 ENCOUNTER — Ambulatory Visit (HOSPITAL_COMMUNITY): Payer: Self-pay | Admitting: Psychiatry

## 2017-03-18 DIAGNOSIS — T50902A Poisoning by unspecified drugs, medicaments and biological substances, intentional self-harm, initial encounter: Secondary | ICD-10-CM | POA: Insufficient documentation

## 2017-03-18 DIAGNOSIS — Z8679 Personal history of other diseases of the circulatory system: Secondary | ICD-10-CM | POA: Insufficient documentation

## 2017-04-05 ENCOUNTER — Telehealth: Payer: Self-pay | Admitting: *Deleted

## 2017-04-05 NOTE — Telephone Encounter (Signed)
Spoke with NP at [facility]. Patient non-adherent with follow-up. AT risk for being dismissed. Can we reach out to patient as well and encourage her to schedule a follow-up with them and also with me.

## 2017-04-05 NOTE — Telephone Encounter (Signed)
Conan Bowens NP at the [facility] with [facility] called and wanted to talk to you regarding this patient . She states you can call her back. Her contact number is [phone]. Thank you

## 2017-04-06 NOTE — Telephone Encounter (Signed)
LMOVM for patient to call back to schedule a follow up appointment.

## 2017-04-12 NOTE — Telephone Encounter (Signed)
LMOVM to call to schedule an follow up appointment

## 2017-04-14 ENCOUNTER — Encounter: Payer: Self-pay | Admitting: Emergency Medicine

## 2017-04-14 NOTE — Telephone Encounter (Signed)
Unable to contact letter mailed to patient to call back to schedule an appointment

## 2017-05-19 ENCOUNTER — Telehealth: Payer: Self-pay | Admitting: Physician Assistant

## 2017-05-19 NOTE — Telephone Encounter (Signed)
FYI

## 2017-05-19 NOTE — Telephone Encounter (Signed)
Page from [facility] called stating that the patient has been dismissed from the clinic for non compliance.

## 2017-05-23 NOTE — Telephone Encounter (Signed)
Noted. Glad to have an update.

## 2017-05-23 NOTE — Telephone Encounter (Signed)
Spoke with patient and she is currently going to [employer]. She doesn't have medical insurance company. She is between jobs. When she gets her insurance back she will start back coming to the primary. Her provider is Velta Addison unsure of her last name.

## 2017-05-23 NOTE — Telephone Encounter (Signed)
Can we attempt to reach out to Mrs Lavin one more time? She has been missing her follow-ups here and at the [facility].

## 2017-08-22 ENCOUNTER — Encounter: Payer: Self-pay | Admitting: Physician Assistant

## 2017-09-28 ENCOUNTER — Encounter: Payer: Self-pay | Admitting: Physician Assistant

## 2017-09-28 ENCOUNTER — Other Ambulatory Visit: Payer: Self-pay

## 2017-09-28 ENCOUNTER — Ambulatory Visit: Payer: BLUE CROSS/BLUE SHIELD | Admitting: Physician Assistant

## 2017-09-28 ENCOUNTER — Encounter: Payer: Self-pay | Admitting: General Practice

## 2017-09-28 VITALS — BP 130/82 | HR 92 | Temp 98.1°F | Resp 16 | Ht 74.0 in | Wt 184.0 lb

## 2017-09-28 DIAGNOSIS — Z7901 Long term (current) use of anticoagulants: Secondary | ICD-10-CM | POA: Diagnosis not present

## 2017-09-28 DIAGNOSIS — F3160 Bipolar disorder, current episode mixed, unspecified: Secondary | ICD-10-CM | POA: Diagnosis not present

## 2017-09-28 MED ORDER — WARFARIN SODIUM 7.5 MG PO TABS: ORAL_TABLET | ORAL | 3 refills | Status: DC

## 2017-09-28 MED ORDER — WARFARIN SODIUM 5 MG PO TABS: ORAL_TABLET | ORAL | 3 refills | Status: DC

## 2017-09-28 NOTE — Progress Notes (Signed)
Patient presents to clinic today for follow-up. Patient had previously lost insurance and thus has been seen by Family services. Has recently resumed insurance coverage and is here today to review medication changes since last visit with this office.   Patient with diagnosis of Bipolar Disorder, currently on a regimen of Citalopram and Zyprexa. Has been taking medications as directed and doing well overall. Is also on Trazodone 50 mg nightly for anxiety and sleep. Is taking with some improvement in sleep but states the medication is not working as well presently. Is only getting 4 hours or so of sleep which is not helping anxiety. Denies depressed mood or anhedonia. Denies any manic episodes. Denies SI/HI. Is still seeing a counselor with family services.   Patient also currently on lifelong anticoagulation with coumadin. Needs new coumadin clinic in the area. Is interested in our clinic here at [facility]. Endorses last heck 2 weeks ago and normal. Is taking medication as directed.  Past Medical History:  Diagnosis Date  . Anticoagulant long-term use    after valve surgery for Marfan's syndrome  . Anxiety   . Ascending aorta dilatation (HCC)   . Bipolar disorder ([place])   . Depression   . Marfan's syndrome    Dr. Atilano Median  . Migraine   . Mitral valve prolapse   . Palpitations   . Suicidal behavior    Tylenol Overdose  . Varicose veins     Current Outpatient Medications on File Prior to Visit  Medication Sig Dispense Refill  . cetirizine (ZYRTEC ALLERGY) 10 MG tablet Take 10 mg by mouth daily.    . citalopram (CELEXA) 20 MG tablet Take 20 mg by mouth daily.     . Melatonin 10 MG TABS Take 50 mg by mouth daily.     Marland Kitchen OLANZapine (ZYPREXA) 15 MG tablet Take 1 tablet (15 mg total) by mouth at bedtime. 90 tablet 0  . OVER THE COUNTER MEDICATION Multivitamin/Iron/Folic Acid (Centrum Ultra Women's Oral)-Take 1 tablet by mouth daily.    . traZODone (DESYREL) 50 MG tablet Take 1 tablet (50 mg total)  by mouth at bedtime as needed for sleep. 90 tablet 0  . medroxyPROGESTERone (DEPO-PROVERA) 150 MG/ML injection Inject 150 mg into the muscle.     No current facility-administered medications on file prior to visit.     No Known Allergies  Family History  Problem Relation Age of Onset  . Cancer Mother 59       Breast   . Marfan syndrome Father   . Heart disease Father   . Marfan syndrome Sister   . Marfan syndrome Paternal Grandmother   . Marfan syndrome Sister     Social History   Socioeconomic History  . Marital status: Divorced    Spouse name: None  . Number of children: None  . Years of education: 48  . Highest education level: None  Social Needs  . Financial resource strain: None  . Food insecurity - worry: None  . Food insecurity - inability: None  . Transportation needs - medical: None  . Transportation needs - non-medical: None  Occupational History  . Occupation: [employer]  Tobacco Use  . Smoking status: Current Every Day Smoker    Packs/day: 2.00    Years: 18.00    Pack years: 36.00    Types: Cigarettes    Last attempt to quit: 04/10/2013    Years since quitting: 4.4  . Smokeless tobacco: Current User  . Tobacco comment: Currently using  e-cigarrettes to help her quit smoking   Substance and Sexual Activity  . Alcohol use: No  . Drug use: No  . Sexual activity: Not Currently  Other Topics Concern  . 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  Off and on    Alcohol Use:  None   Drug Use:  None   Diet:  Regular   Exercise:  None   Hobbies:  Reading, Traveling             Review of Systems - See HPI.  All other ROS are negative.  BP 130/82   Pulse 92   Temp 98.1 F (36.7 C) (Oral)   Resp 16   Ht 6\' 2"  (1.88 m)   Wt 184 lb (83.5 kg)   LMP 09/20/2017 (Approximate)   SpO2 97%   BMI  23.62 kg/m   Physical Exam  Constitutional: She is oriented to person, place, and time and well-developed, well-nourished, and in no distress.  HENT:  Head: Normocephalic and atraumatic.  Eyes: Conjunctivae are normal.  Neck: Neck supple.  Cardiovascular: Normal rate, regular rhythm, normal heart sounds and intact distal pulses.  Pulmonary/Chest: Effort normal and breath sounds normal. No respiratory distress. She has no wheezes. She has no rales. She exhibits no tenderness.  Lymphadenopathy:    She has no cervical adenopathy.  Neurological: She is alert and oriented to person, place, and time.  Skin: Skin is warm and dry. No rash noted.  Psychiatric: Affect normal.  Vitals reviewed.  Assessment/Plan: Bipolar disorder, mixed Patient doing very well at present. Will continue combination of Citalopram and Zyprexa. Increase Trazodone to 75 mg night to further help with sleep. Counseling to be continued. Patient scheduled for CPE within next couple of weeks.   Chronic anticoagulation Referral to [facility] placed for ongoing INR checks.    Leeanne Rio, PA-C

## 2017-09-28 NOTE — Patient Instructions (Signed)
Please stay well-hydrated and get plenty of rest.  Continue coumadin as directed. I am setting you up with our coumadin clinic.  Please continue citalopram and Zyprexa as directed. Take 1.5 tablets of the Trazodone nightly for a few nights to see if this helps with sleep. Add on a Tylenol arthritis twice daily to see if this helps with hip pain.  Follow-up with me in the next couple of weeks for a complete physical. We will update PAP and restart Depo at that time.

## 2017-10-02 NOTE — Assessment & Plan Note (Signed)
Patient doing very well at present. Will continue combination of Citalopram and Zyprexa. Increase Trazodone to 75 mg night to further help with sleep. Counseling to be continued. Patient scheduled for CPE within next couple of weeks.

## 2017-10-02 NOTE — Assessment & Plan Note (Signed)
Referral to [facility] placed for ongoing INR checks.

## 2017-10-03 ENCOUNTER — Ambulatory Visit (INDEPENDENT_AMBULATORY_CARE_PROVIDER_SITE_OTHER): Payer: BLUE CROSS/BLUE SHIELD | Admitting: General Practice

## 2017-10-03 DIAGNOSIS — Z7901 Long term (current) use of anticoagulants: Secondary | ICD-10-CM

## 2017-10-03 NOTE — Progress Notes (Signed)
I agree with this plan.

## 2017-10-06 ENCOUNTER — Other Ambulatory Visit: Payer: Self-pay | Admitting: Physician Assistant

## 2017-10-06 DIAGNOSIS — F3162 Bipolar disorder, current episode mixed, moderate: Secondary | ICD-10-CM

## 2017-10-06 NOTE — Telephone Encounter (Signed)
Copied from [place] [id number]. Topic: Quick Communication - Rx Refill/Question >> Oct 06, 2017  4:45 PM Oliver Pila B wrote: Has the patient contacted their pharmacy? yes   PT REQUESTED TO HAVE THE FOLLOWING RX'S FILLED CONTACT PT OR PHARMACY IF NEEDED  cetirizine (ZYRTEC ALLERGY) 10 MG tablet [[id number]]  citalopram (CELEXA) 20 MG tablet [[id number]] medroxyPROGESTERone (DEPO-PROVERA) 150 MG/ML injection [[id number]]  Melatonin 10 MG TABS [[id number]]  OLANZapine (ZYPREXA) 15 MG tablet [[id number]]  OVER THE COUNTER MEDICATION [[id number]]  traZODone (DESYREL) 50 MG tablet [[id number]]

## 2017-10-07 NOTE — Telephone Encounter (Signed)
Unsure what medications PCP was going to take over from office visit. Please advise which medications you are taking over?

## 2017-10-10 ENCOUNTER — Other Ambulatory Visit: Payer: Self-pay

## 2017-10-10 ENCOUNTER — Telehealth: Payer: Self-pay | Admitting: Physician Assistant

## 2017-10-10 DIAGNOSIS — F3162 Bipolar disorder, current episode mixed, moderate: Secondary | ICD-10-CM

## 2017-10-10 MED ORDER — OLANZAPINE 15 MG PO TABS: 15.0000 mg | ORAL_TABLET | Freq: Every day | ORAL | 0 refills | Status: DC

## 2017-10-10 MED ORDER — CITALOPRAM HYDROBROMIDE 20 MG PO TABS: 20.0000 mg | ORAL_TABLET | Freq: Every day | ORAL | 1 refills | Status: DC

## 2017-10-10 MED ORDER — TRAZODONE HCL 50 MG PO TABS: 50.0000 mg | ORAL_TABLET | Freq: Every evening | ORAL | 0 refills | Status: DC | PRN

## 2017-10-10 MED ORDER — MELATONIN 10 MG PO TABS: 50.0000 mg | ORAL_TABLET | Freq: Every day | ORAL | 1 refills | Status: DC

## 2017-10-10 MED ORDER — CETIRIZINE HCL 10 MG PO TABS: 10.0000 mg | ORAL_TABLET | Freq: Every day | ORAL | 1 refills | Status: DC

## 2017-10-10 NOTE — Addendum Note (Signed)
Addended by: Leonidas Romberg on: 10/10/2017 11:34 AM   Modules accepted: Orders

## 2017-10-10 NOTE — Telephone Encounter (Signed)
Ok have refill all other medications except for the Zyprexa for PCP to refill.

## 2017-10-10 NOTE — Telephone Encounter (Signed)
Pt calling to check to see why Zyprexa was refused a refill. Looked up med and was ordered via printed RX. Reordered and sent electronically to [facility].

## 2017-10-10 NOTE — Telephone Encounter (Signed)
We will take over all of the below. The only one we will hold off on is the Depo-provera injections that we were going to restart at her CPE.

## 2017-10-18 ENCOUNTER — Encounter: Payer: Self-pay | Admitting: Physician Assistant

## 2017-10-18 ENCOUNTER — Encounter: Payer: Self-pay | Admitting: General Practice

## 2017-10-18 ENCOUNTER — Ambulatory Visit: Payer: BLUE CROSS/BLUE SHIELD | Admitting: Physician Assistant

## 2017-10-18 VITALS — BP 108/70 | HR 51 | Temp 98.3°F | Resp 16 | Ht 74.0 in | Wt 184.0 lb

## 2017-10-18 DIAGNOSIS — J0110 Acute frontal sinusitis, unspecified: Secondary | ICD-10-CM | POA: Diagnosis not present

## 2017-10-18 DIAGNOSIS — Z7901 Long term (current) use of anticoagulants: Secondary | ICD-10-CM

## 2017-10-18 NOTE — Progress Notes (Signed)
Patient presents to clinic today c/o sinus pressure, sinus pain, ear pressure, pnd and dry cough. Has felt feverish over the past couple of days but denies objective fevers. Notes significant fatigue. Denies chest pain or SOB. Denies recent travel. Patient has not followed up with coumadin clinic is scheduled. Is taking 5 mg on M, W, F and 7.5 mg on other days. Last dose yesterday at 5 mg.  Past Medical History:  Diagnosis Date  . Anticoagulant long-term use    after valve surgery for Marfan's syndrome  . Anxiety   . Ascending aorta dilatation (HCC)   . Bipolar disorder ([place])   . Depression   . Marfan's syndrome    Dr. Atilano Median  . Migraine   . Mitral valve prolapse   . Palpitations   . Suicidal behavior    Tylenol Overdose  . Varicose veins     Current Outpatient Medications on File Prior to Visit  Medication Sig Dispense Refill  . cetirizine (ZYRTEC ALLERGY) 10 MG tablet Take 1 tablet (10 mg total) by mouth daily. 90 tablet 1  . citalopram (CELEXA) 20 MG tablet Take 1 tablet (20 mg total) by mouth daily. 90 tablet 1  . medroxyPROGESTERone (DEPO-PROVERA) 150 MG/ML injection Inject 150 mg into the muscle.    . Melatonin 10 MG TABS Take 50 mg by mouth daily. 90 tablet 1  . OLANZapine (ZYPREXA) 15 MG tablet Take 1 tablet (15 mg total) by mouth at bedtime. 90 tablet 0  . OVER THE COUNTER MEDICATION Multivitamin/Iron/Folic Acid (Centrum Ultra Women's Oral)-Take 1 tablet by mouth daily.    . traZODone (DESYREL) 50 MG tablet Take 1 tablet (50 mg total) by mouth at bedtime as needed for sleep. 90 tablet 0  . warfarin (COUMADIN) 5 MG tablet Take 1 tablet (5 mg) as directed on M, W, F. Takes 7.5 mg on other days 30 tablet 3  . warfarin (COUMADIN) 7.5 MG tablet Take 1 tablet (7.5 mg) on T, Th, Sa, Su. Takes 5 mg on other days. 30 tablet 3   No current facility-administered medications on file prior to visit.     No Known Allergies  Family History  Problem Relation Age of Onset  .  Cancer Mother 49       Breast   . Marfan syndrome Father   . Heart disease Father   . Marfan syndrome Sister   . Marfan syndrome Paternal Grandmother   . Marfan syndrome Sister     Social History   Socioeconomic History  . Marital status: Divorced    Spouse name: None  . Number of children: None  . Years of education: 78  . Highest education level: None  Social Needs  . Financial resource strain: None  . Food insecurity - worry: None  . Food insecurity - inability: None  . Transportation needs - medical: None  . Transportation needs - non-medical: None  Occupational History  . Occupation: [employer]  Tobacco Use  . Smoking status: Current Every Day Smoker    Packs/day: 2.00    Years: 18.00    Pack years: 36.00    Types: Cigarettes    Last attempt to quit: 04/10/2013    Years since quitting: 4.5  . Smokeless tobacco: Current User  . Tobacco comment: Currently using e-cigarrettes to help her quit smoking   Substance and Sexual Activity  . Alcohol use: No  . Drug use: No  . Sexual activity: Not Currently  Other Topics Concern  .  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  Off and on    Alcohol Use:  None   Drug Use:  None   Diet:  Regular   Exercise:  None   Hobbies:  Reading, Traveling             Review of Systems - See HPI.  All other ROS are negative.  BP 108/70   Pulse (!) 51   Temp 98.3 F (36.8 C) (Oral)   Resp 16   Ht 6\' 2"  (1.88 m)   Wt 184 lb (83.5 kg)   LMP 09/20/2017 (Approximate)   SpO2 98%   BMI 23.62 kg/m   Physical Exam  Constitutional: She is well-developed, well-nourished, and in no distress.  HENT:  Head: Normocephalic and atraumatic.  Right Ear: External ear normal.  Left Ear: External ear normal.  Nose: Nose normal.  Mouth/Throat: Oropharynx is clear and moist. No  oropharyngeal exudate.  L TM within normal limits. R TM with some serous fluid and mild retraction noted. + TTP of sinuses.  Eyes: Conjunctivae are normal.  Neck: Neck supple.  Cardiovascular: Normal rate, regular rhythm, normal heart sounds and intact distal pulses.  Pulmonary/Chest: Effort normal and breath sounds normal. No respiratory distress. She has no wheezes. She has no rales. She exhibits no tenderness.  Lymphadenopathy:    She has no cervical adenopathy.  Skin: Skin is warm and dry. No rash noted.  Vitals reviewed.  Assessment/Plan: 1. Acute non-recurrent frontal sinusitis Likely viral but some worsening symptoms so we will keep a close watch on things. Want to avoid unnecessary antibiotics due to chronic coumadin and supratherapeutic INR level today. Supportive measures and OTC medications reviewed. Strict precautions given warranting call back.  2. Chronic anticoagulation INR supratherapeutic. Hold today's dose (7.5 mg) and tomorrow take 1/2 tablet (2.5 mg), then resume normal dose. Repeat INR Thursday.   Leeanne Rio, PA-C

## 2017-10-18 NOTE — Patient Instructions (Signed)
Increase fluid intake.  Use Saline nasal spray.  Take a daily multivitamin. Start Flonase as directed. Mucinex DM for cough  Place a humidifier in the bedroom.  Please call or return clinic if symptoms are not improving.  I am holding antibiotic at present until we get your INR level to a better level.  Please hold today's dose and take 1/2 tablet (2.5 mg) tomorrow.  Return here or the coumadin clinic Thursday for repeat INR.    Sinusitis Sinusitis is redness, soreness, and swelling (inflammation) of the paranasal sinuses. Paranasal sinuses are air pockets within the bones of your face (beneath the eyes, the middle of the forehead, or above the eyes). In healthy paranasal sinuses, mucus is able to drain out, and air is able to circulate through them by way of your nose. However, when your paranasal sinuses are inflamed, mucus and air can become trapped. This can allow bacteria and other germs to grow and cause infection. Sinusitis can develop quickly and last only a short time (acute) or continue over a long period (chronic). Sinusitis that lasts for more than 12 weeks is considered chronic.  CAUSES  Causes of sinusitis include:  Allergies.  Structural abnormalities, such as displacement of the cartilage that separates your nostrils (deviated septum), which can decrease the air flow through your nose and sinuses and affect sinus drainage.  Functional abnormalities, such as when the small hairs (cilia) that line your sinuses and help remove mucus do not work properly or are not present. SYMPTOMS  Symptoms of acute and chronic sinusitis are the same. The primary symptoms are pain and pressure around the affected sinuses. Other symptoms include:  Upper toothache.  Earache.  Headache.  Bad breath.  Decreased sense of smell and taste.  A cough, which worsens when you are lying flat.  Fatigue.  Fever.  Thick drainage from your nose, which often is green and may contain pus  (purulent).  Swelling and warmth over the affected sinuses. DIAGNOSIS  Your caregiver will perform a physical exam. During the exam, your caregiver may:  Look in your nose for signs of abnormal growths in your nostrils (nasal polyps).  Tap over the affected sinus to check for signs of infection.  View the inside of your sinuses (endoscopy) with a special imaging device with a light attached (endoscope), which is inserted into your sinuses. If your caregiver suspects that you have chronic sinusitis, one or more of the following tests may be recommended:  Allergy tests.  Nasal culture A sample of mucus is taken from your nose and sent to a lab and screened for bacteria.  Nasal cytology A sample of mucus is taken from your nose and examined by your caregiver to determine if your sinusitis is related to an allergy. TREATMENT  Most cases of acute sinusitis are related to a viral infection and will resolve on their own within 10 days. Sometimes medicines are prescribed to help relieve symptoms (pain medicine, decongestants, nasal steroid sprays, or saline sprays).  However, for sinusitis related to a bacterial infection, your caregiver will prescribe antibiotic medicines. These are medicines that will help kill the bacteria causing the infection.  Rarely, sinusitis is caused by a fungal infection. In theses cases, your caregiver will prescribe antifungal medicine. For some cases of chronic sinusitis, surgery is needed. Generally, these are cases in which sinusitis recurs more than 3 times per year, despite other treatments. HOME CARE INSTRUCTIONS   Drink plenty of water. Water helps thin the mucus so  your sinuses can drain more easily.  Use a humidifier.  Inhale steam 3 to 4 times a day (for example, sit in the bathroom with the shower running).  Apply a warm, moist washcloth to your face 3 to 4 times a day, or as directed by your caregiver.  Use saline nasal sprays to help moisten and  clean your sinuses.  Take over-the-counter or prescription medicines for pain, discomfort, or fever only as directed by your caregiver. SEEK IMMEDIATE MEDICAL CARE IF:  You have increasing pain or severe headaches.  You have nausea, vomiting, or drowsiness.  You have swelling around your face.  You have vision problems.  You have a stiff neck.  You have difficulty breathing. MAKE SURE YOU:   Understand these instructions.  Will watch your condition.  Will get help right away if you are not doing well or get worse. Document Released: 09/27/2005 Document Revised: 12/20/2011 Document Reviewed: 10/12/2011 [facility] Patient Information 2014 [place], [place].

## 2017-10-19 LAB — POCT INR: INR: 6.8

## 2017-10-19 NOTE — Addendum Note (Signed)
Addended by: Leonidas Romberg on: 10/19/2017 10:37 AM   Modules accepted: Orders

## 2017-10-20 ENCOUNTER — Ambulatory Visit (INDEPENDENT_AMBULATORY_CARE_PROVIDER_SITE_OTHER): Payer: BLUE CROSS/BLUE SHIELD | Admitting: *Deleted

## 2017-10-20 ENCOUNTER — Other Ambulatory Visit: Payer: Self-pay | Admitting: Physician Assistant

## 2017-10-20 ENCOUNTER — Ambulatory Visit: Payer: Self-pay

## 2017-10-20 DIAGNOSIS — Z7901 Long term (current) use of anticoagulants: Secondary | ICD-10-CM | POA: Diagnosis not present

## 2017-10-20 LAB — POCT INR: INR: 4.7

## 2017-10-20 MED ORDER — AMOXICILLIN 875 MG PO TABS: 875.0000 mg | ORAL_TABLET | Freq: Two times a day (BID) | ORAL | 0 refills | Status: DC

## 2017-10-20 NOTE — Progress Notes (Addendum)
Per Elyn Aquas, PA-C:  Hold today's dose of coumadin.  Take 7.5mg  twice weekly (Tuesday & Thursday)  And 5mg  every other day of the week.  Repeat INR in 1 week.

## 2017-10-20 NOTE — Patient Instructions (Signed)
Per Elyn Aquas, PA-C:  Hold today's dose of coumadin.  Take 7.5mg  twice weekly (Tuesday & Thursday)  And 5mg  every other day of the week.  Repeat INR in 1 week.

## 2017-10-24 ENCOUNTER — Ambulatory Visit: Payer: Self-pay

## 2017-10-25 ENCOUNTER — Encounter: Payer: Self-pay | Admitting: Physician Assistant

## 2017-10-27 ENCOUNTER — Ambulatory Visit: Payer: Self-pay

## 2017-10-31 ENCOUNTER — Other Ambulatory Visit: Payer: Self-pay

## 2017-10-31 ENCOUNTER — Ambulatory Visit (INDEPENDENT_AMBULATORY_CARE_PROVIDER_SITE_OTHER): Payer: BLUE CROSS/BLUE SHIELD | Admitting: Physician Assistant

## 2017-10-31 ENCOUNTER — Encounter: Payer: Self-pay | Admitting: Physician Assistant

## 2017-10-31 VITALS — BP 102/70 | HR 45 | Temp 98.3°F | Resp 14 | Ht 74.0 in | Wt 184.0 lb

## 2017-10-31 DIAGNOSIS — Z1231 Encounter for screening mammogram for malignant neoplasm of breast: Secondary | ICD-10-CM | POA: Diagnosis not present

## 2017-10-31 DIAGNOSIS — F3160 Bipolar disorder, current episode mixed, unspecified: Secondary | ICD-10-CM

## 2017-10-31 DIAGNOSIS — Z1239 Encounter for other screening for malignant neoplasm of breast: Secondary | ICD-10-CM

## 2017-10-31 DIAGNOSIS — Z0000 Encounter for general adult medical examination without abnormal findings: Secondary | ICD-10-CM

## 2017-10-31 DIAGNOSIS — I499 Cardiac arrhythmia, unspecified: Secondary | ICD-10-CM | POA: Diagnosis not present

## 2017-10-31 DIAGNOSIS — Z7901 Long term (current) use of anticoagulants: Secondary | ICD-10-CM | POA: Diagnosis not present

## 2017-10-31 LAB — CBC WITH DIFFERENTIAL/PLATELET
BASOS ABS: 0 10*3/uL (ref 0.0–0.1)
Basophils Relative: 0.4 % (ref 0.0–3.0)
EOS ABS: 0.1 10*3/uL (ref 0.0–0.7)
Eosinophils Relative: 1.4 % (ref 0.0–5.0)
HCT: 44 % (ref 36.0–46.0)
HEMOGLOBIN: 14.4 g/dL (ref 12.0–15.0)
Lymphocytes Relative: 23 % (ref 12.0–46.0)
Lymphs Abs: 1.8 10*3/uL (ref 0.7–4.0)
MCHC: 32.7 g/dL (ref 30.0–36.0)
MCV: 88.4 fl (ref 78.0–100.0)
MONOS PCT: 5.6 % (ref 3.0–12.0)
Monocytes Absolute: 0.4 10*3/uL (ref 0.1–1.0)
Neutro Abs: 5.4 10*3/uL (ref 1.4–7.7)
Neutrophils Relative %: 69.6 % (ref 43.0–77.0)
Platelets: 244 10*3/uL (ref 150.0–400.0)
RBC: 4.98 Mil/uL (ref 3.87–5.11)
RDW: 16.1 % — ABNORMAL HIGH (ref 11.5–15.5)
WBC: 7.7 10*3/uL (ref 4.0–10.5)

## 2017-10-31 LAB — COMPREHENSIVE METABOLIC PANEL
ALK PHOS: 55 U/L (ref 39–117)
ALT: 6 U/L (ref 0–35)
AST: 14 U/L (ref 0–37)
Albumin: 4.3 g/dL (ref 3.5–5.2)
BILIRUBIN TOTAL: 0.8 mg/dL (ref 0.2–1.2)
BUN: 11 mg/dL (ref 6–23)
CALCIUM: 9.3 mg/dL (ref 8.4–10.5)
CO2: 28 mEq/L (ref 19–32)
Chloride: 105 mEq/L (ref 96–112)
Creatinine, Ser: 0.71 mg/dL (ref 0.40–1.20)
GFR: 96.38 mL/min (ref >60.00)
GLUCOSE: 82 mg/dL (ref 70–99)
Potassium: 4.5 mEq/L (ref 3.5–5.1)
Sodium: 140 mEq/L (ref 135–145)
TOTAL PROTEIN: 6.6 g/dL (ref 6.0–8.3)

## 2017-10-31 LAB — LIPID PANEL
CHOL/HDL RATIO: 5
Cholesterol: 244 mg/dL — ABNORMAL HIGH (ref 0–200)
HDL: 44.7 mg/dL (ref >39.00)
LDL Cholesterol: 167 mg/dL — ABNORMAL HIGH (ref 0–99)
NONHDL: 198.8
TRIGLYCERIDES: 160 mg/dL — AB (ref 0.0–149.0)
VLDL: 32 mg/dL (ref 0.0–40.0)

## 2017-10-31 LAB — TSH: TSH: 1.66 u[IU]/mL (ref 0.35–4.50)

## 2017-10-31 LAB — HEMOGLOBIN A1C: Hgb A1c MFr Bld: 5.2 % (ref 4.6–6.5)

## 2017-10-31 LAB — POCT INR: INR: 4.4

## 2017-10-31 NOTE — Progress Notes (Signed)
Patient presents to clinic today for annual exam.  Patient is fasting for labs.  Endorses diet is well-balanced overall. No regular exercise presently. Is staying well-hydrated. Patient is just finishing antibiotic course for sinusitis. Is feeling much better. Is due for repeat INR check today. Endorses taking her coumadin as directed.  Health Maintenance: Immunizations -- Tetanus up-to-date. Declines flu shot. Mammogram -- Due for repeat mammogram. Denies history of abnormal. Denies concerns today. PAP -- Due. Patient started period today so wants to reschedule.   Past Medical History:  Diagnosis Date  . Anticoagulant long-term use    after valve surgery for Marfan's syndrome  . Anxiety   . Ascending aorta dilatation (HCC)   . Bipolar disorder ([place])   . Depression   . Marfan's syndrome    Dr. Atilano Median  . Migraine   . Mitral valve prolapse   . Palpitations   . Suicidal behavior    Tylenol Overdose  . Varicose veins     Past Surgical History:  Procedure Laterality Date  . ANKLE SURGERY Left   . BREAST CYST EXCISION    . CARDIAC VALVE SURGERY  09/2014   AV, MV replacement, aortic root replacement (Dr Ysidro Evert at [facility])  . INNER EAR SURGERY Right     Current Outpatient Medications on File Prior to Visit  Medication Sig Dispense Refill  . amoxicillin (AMOXIL) 875 MG tablet Take 1 tablet (875 mg total) by mouth 2 (two) times daily. 20 tablet 0  . cetirizine (ZYRTEC ALLERGY) 10 MG tablet Take 1 tablet (10 mg total) by mouth daily. 90 tablet 1  . citalopram (CELEXA) 20 MG tablet Take 1 tablet (20 mg total) by mouth daily. 90 tablet 1  . Melatonin 10 MG TABS Take 50 mg by mouth daily. 90 tablet 1  . OLANZapine (ZYPREXA) 15 MG tablet Take 1 tablet (15 mg total) by mouth at bedtime. 90 tablet 0  . OVER THE COUNTER MEDICATION Multivitamin/Iron/Folic Acid (Centrum Ultra Women's Oral)-Take 1 tablet by mouth daily.    . traZODone (DESYREL) 50 MG tablet Take 1 tablet (50 mg total) by  mouth at bedtime as needed for sleep. 90 tablet 0  . warfarin (COUMADIN) 5 MG tablet Take 1 tablet (5 mg) as directed on M, W, F. Takes 7.5 mg on other days 30 tablet 3  . warfarin (COUMADIN) 7.5 MG tablet Take 1 tablet (7.5 mg) on T, Th, Sa, Su. Takes 5 mg on other days. 30 tablet 3  . medroxyPROGESTERone (DEPO-PROVERA) 150 MG/ML injection Inject 150 mg into the muscle.     No current facility-administered medications on file prior to visit.     No Known Allergies  Family History  Problem Relation Age of Onset  . Cancer Mother 11       Breast   . Marfan syndrome Father   . Heart disease Father   . Marfan syndrome Sister   . Marfan syndrome Paternal Grandmother   . Marfan syndrome Sister     Social History   Socioeconomic History  . Marital status: Divorced    Spouse name: Not on file  . Number of children: Not on file  . Years of education: 61  . Highest education level: Not on file  Social Needs  . Financial resource strain: Not on file  . Food insecurity - worry: Not on file  . Food insecurity - inability: Not on file  . Transportation needs - medical: Not on file  . Transportation needs - non-medical:  Not on file  Occupational History  . Occupation: [employer]  Tobacco Use  . Smoking status: Current Every Day Smoker    Packs/day: 2.00    Years: 18.00    Pack years: 36.00    Types: Cigarettes    Last attempt to quit: 04/10/2013    Years since quitting: 4.5  . Smokeless tobacco: Current User  . Tobacco comment: Currently using e-cigarrettes to help her quit smoking   Substance and Sexual Activity  . Alcohol use: No  . Drug use: No  . Sexual activity: Not Currently  Other Topics Concern  . 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  Off and on    Alcohol Use:  None   Drug Use:  None   Diet:   Regular   Exercise:  None   Hobbies:  Reading, Traveling             Review of Systems  Constitutional: Negative for fever and weight loss.  HENT: Negative for ear discharge, ear pain, hearing loss and tinnitus.   Eyes: Negative for blurred vision, double vision, photophobia and pain.  Respiratory: Negative for cough and shortness of breath.   Cardiovascular: Negative for chest pain and palpitations.  Gastrointestinal: Negative for abdominal pain, blood in stool, constipation, diarrhea, heartburn, melena, nausea and vomiting.  Genitourinary: Negative for dysuria, flank pain, frequency, hematuria and urgency.  Musculoskeletal: Negative for falls.  Neurological: Negative for dizziness, loss of consciousness and headaches.  Endo/Heme/Allergies: Negative for environmental allergies.  Psychiatric/Behavioral: Negative for depression, hallucinations, substance abuse and suicidal ideas. The patient is not nervous/anxious and does not have insomnia.    BP 102/70   Pulse (!) 45   Temp 98.3 F (36.8 C) (Oral)   Resp 14   Ht 6\' 2"  (1.88 m)   Wt 184 lb (83.5 kg)   SpO2 99%   BMI 23.62 kg/m   Physical Exam  Constitutional: She is oriented to person, place, and time and well-developed, well-nourished, and in no distress.  HENT:  Head: Normocephalic and atraumatic.  Right Ear: Tympanic membrane, external ear and ear canal normal.  Left Ear: Tympanic membrane, external ear and ear canal normal.  Nose: Nose normal. No mucosal edema.  Mouth/Throat: Uvula is midline, oropharynx is clear and moist and mucous membranes are normal. No oropharyngeal exudate or posterior oropharyngeal erythema.  Eyes: Conjunctivae are normal. Pupils are equal, round, and reactive to light.  Neck: Neck supple. No thyromegaly present.  Cardiovascular: Intact distal pulses. An irregular rhythm present. Frequent extrasystoles are present. Bradycardia present.  Murmur heard.  Systolic murmur is present with a grade of  2/6. Chronic II/VI systolic murmur.  Pulmonary/Chest: Effort normal and breath sounds normal. No respiratory distress. She has no wheezes. She has no rales.  Abdominal: Soft. Bowel sounds are normal. She exhibits no distension and no mass. There is no tenderness. There is no rebound and no guarding.  Lymphadenopathy:    She has no cervical adenopathy.  Neurological: She is alert and oriented to person, place, and time. No cranial nerve deficit.  Skin: Skin is warm and dry. No rash noted.  Psychiatric: Affect normal.  Vitals reviewed.  Recent Results (from the past 2160 hour(s))  POCT INR     Status:  Abnormal   Collection Time: 10/18/17 10:37 AM  Result Value Ref Range   INR 6.8   POCT INR     Status: Abnormal   Collection Time: 10/20/17  9:25 AM  Result Value Ref Range   INR 4.7   POCT INR     Status: Abnormal   Collection Time: 10/31/17  3:16 PM  Result Value Ref Range   INR 4.4   CBC with Differential/Platelet     Status: Abnormal   Collection Time: 10/31/17  3:26 PM  Result Value Ref Range   WBC 7.7 4.0 - 10.5 K/uL   RBC 4.98 3.87 - 5.11 Mil/uL   Hemoglobin 14.4 12.0 - 15.0 g/dL   HCT 44.0 36.0 - 46.0 %   MCV 88.4 78.0 - 100.0 fl   MCHC 32.7 30.0 - 36.0 g/dL   RDW 16.1 (H) 11.5 - 15.5 %   Platelets 244.0 150.0 - 400.0 K/uL   Neutrophils Relative % 69.6 43.0 - 77.0 %   Lymphocytes Relative 23.0 12.0 - 46.0 %   Monocytes Relative 5.6 3.0 - 12.0 %   Eosinophils Relative 1.4 0.0 - 5.0 %   Basophils Relative 0.4 0.0 - 3.0 %   Neutro Abs 5.4 1.4 - 7.7 K/uL   Lymphs Abs 1.8 0.7 - 4.0 K/uL   Monocytes Absolute 0.4 0.1 - 1.0 K/uL   Eosinophils Absolute 0.1 0.0 - 0.7 K/uL   Basophils Absolute 0.0 0.0 - 0.1 K/uL  Comprehensive metabolic panel     Status: None   Collection Time: 10/31/17  3:26 PM  Result Value Ref Range   Sodium 140 135 - 145 mEq/L   Potassium 4.5 3.5 - 5.1 mEq/L   Chloride 105 96 - 112 mEq/L   CO2 28 19 - 32 mEq/L   Glucose, Bld 82 70 - 99 mg/dL   BUN 11  6 - 23 mg/dL   Creatinine, Ser 0.71 0.40 - 1.20 mg/dL   Total Bilirubin 0.8 0.2 - 1.2 mg/dL   Alkaline Phosphatase 55 39 - 117 U/L   AST 14 0 - 37 U/L   ALT 6 0 - 35 U/L   Total Protein 6.6 6.0 - 8.3 g/dL   Albumin 4.3 3.5 - 5.2 g/dL   Calcium 9.3 8.4 - 10.5 mg/dL   GFR 96.38 >60.00 mL/min  Lipid panel     Status: Abnormal   Collection Time: 10/31/17  3:26 PM  Result Value Ref Range   Cholesterol 244 (H) 0 - 200 mg/dL    Comment: ATP III Classification       Desirable:  < 200 mg/dL               Borderline High:  200 - 239 mg/dL          High:  > = 240 mg/dL   Triglycerides 160.0 (H) 0.0 - 149.0 mg/dL    Comment: Normal:  <150 mg/dLBorderline High:  150 - 199 mg/dL   HDL 44.70 >39.00 mg/dL   VLDL 32.0 0.0 - 40.0 mg/dL   LDL Cholesterol 167 (H) 0 - 99 mg/dL   Total CHOL/HDL Ratio 5     Comment:                Men          Women1/2 Average Risk     3.4          3.3Average Risk          5.0  4.42X Average Risk          9.6          7.13X Average Risk          15.0          11.0                       NonHDL 198.80     Comment: NOTE:  Non-HDL goal should be 30 mg/dL higher than patient's LDL goal (i.e. LDL goal of < 70 mg/dL, would have non-HDL goal of < 100 mg/dL)  Hemoglobin A1c     Status: None   Collection Time: 10/31/17  3:26 PM  Result Value Ref Range   Hgb A1c MFr Bld 5.2 4.6 - 6.5 %    Comment: Glycemic Control Guidelines for People with Diabetes:Non Diabetic:  <6%Goal of Therapy: <7%Additional Action Suggested:  >8%   TSH     Status: None   Collection Time: 10/31/17  3:26 PM  Result Value Ref Range   TSH 1.66 0.35 - 4.50 uIU/mL   Assessment/Plan: Bipolar disorder, mixed ([place]) Endorses doing very well presently. Continue current regimen.   Breast cancer screening Order for 3D screening mammogram placed. Patient to return next week for breast exam and pelvic exam with PAP once she is off of her menstrual period.  Irregular cardiac rhythm EKG revealing Sinus  Rhythm  - frequent multiform ectopic ventricular beats. Asymptomatic. She has not followed up with Cardiology since last visit 1 year ago. Appointment scheduled with Cardiologist this week for further assessment. Strict ER precautions given to patient.   Long term (current) use of anticoagulants Repeat INR today at 4.4. She is to hold today's dose and take 5 mg tomorrow. Then resume her regular regimen. Repeat INR 1 week.  Visit for preventive health examination Depression screen negative. Health Maintenance reviewed. Preventive schedule discussed and handout given in AVS. Will obtain fasting labs today.     Leeanne Rio, PA-C

## 2017-10-31 NOTE — Patient Instructions (Signed)
Please go to the lab for blood work.   Our office will call you with your results unless you have chosen to receive results via MyChart.  If your blood work is normal we will follow-up each year for physicals and as scheduled for chronic medical problems.  If anything is abnormal we will treat accordingly and get you in for a follow-up.  Stop by the front desk to speak with Levada Dy to schedule appointment with Dr. Percival Spanish..  I recommend getting OTC nicoderm CQ Step 1 patch to use as directed for smoking cessation.   Schedule your PAP smear -- we can remove the skin tag on your side at the same time.     Preventive Care 40-64 Years, Female Preventive care refers to lifestyle choices and visits with your health care provider that can promote health and wellness. What does preventive care include?  A yearly physical exam. This is also called an annual well check.  Dental exams once or twice a year.  Routine eye exams. Ask your health care provider how often you should have your eyes checked.  Personal lifestyle choices, including: ? Daily care of your teeth and gums. ? Regular physical activity. ? Eating a healthy diet. ? Avoiding tobacco and drug use. ? Limiting alcohol use. ? Practicing safe sex. ? Taking low-dose aspirin daily starting at age 34. ? Taking vitamin and mineral supplements as recommended by your health care provider. What happens during an annual well check? The services and screenings done by your health care provider during your annual well check will depend on your age, overall health, lifestyle risk factors, and family history of disease. Counseling Your health care provider may ask you questions about your:  Alcohol use.  Tobacco use.  Drug use.  Emotional well-being.  Home and relationship well-being.  Sexual activity.  Eating habits.  Work and work Statistician.  Method of birth control.  Menstrual cycle.  Pregnancy  history.  Screening You may have the following tests or measurements:  Height, weight, and BMI.  Blood pressure.  Lipid and cholesterol levels. These may be checked every 5 years, or more frequently if you are over 23 years old.  Skin check.  Lung cancer screening. You may have this screening every year starting at age 70 if you have a 30-pack-year history of smoking and currently smoke or have quit within the past 15 years.  Fecal occult blood test (FOBT) of the stool. You may have this test every year starting at age 88.  Flexible sigmoidoscopy or colonoscopy. You may have a sigmoidoscopy every 5 years or a colonoscopy every 10 years starting at age 73.  Hepatitis C blood test.  Hepatitis B blood test.  Sexually transmitted disease (STD) testing.  Diabetes screening. This is done by checking your blood sugar (glucose) after you have not eaten for a while (fasting). You may have this done every 1-3 years.  Mammogram. This may be done every 1-2 years. Talk to your health care provider about when you should start having regular mammograms. This may depend on whether you have a family history of breast cancer.  BRCA-related cancer screening. This may be done if you have a family history of breast, ovarian, tubal, or peritoneal cancers.  Pelvic exam and Pap test. This may be done every 3 years starting at age 74. Starting at age 46, this may be done every 5 years if you have a Pap test in combination with an HPV test.  Bone density scan.  This is done to screen for osteoporosis. You may have this scan if you are at high risk for osteoporosis.  Discuss your test results, treatment options, and if necessary, the need for more tests with your health care provider. Vaccines Your health care provider may recommend certain vaccines, such as:  Influenza vaccine. This is recommended every year.  Tetanus, diphtheria, and acellular pertussis (Tdap, Td) vaccine. You may need a Td booster  every 10 years.  Varicella vaccine. You may need this if you have not been vaccinated.  Zoster vaccine. You may need this after age 44.  Measles, mumps, and rubella (MMR) vaccine. You may need at least one dose of MMR if you were born in 1957 or later. You may also need a second dose.  Pneumococcal 13-valent conjugate (PCV13) vaccine. You may need this if you have certain conditions and were not previously vaccinated.  Pneumococcal polysaccharide (PPSV23) vaccine. You may need one or two doses if you smoke cigarettes or if you have certain conditions.  Meningococcal vaccine. You may need this if you have certain conditions.  Hepatitis A vaccine. You may need this if you have certain conditions or if you travel or work in places where you may be exposed to hepatitis A.  Hepatitis B vaccine. You may need this if you have certain conditions or if you travel or work in places where you may be exposed to hepatitis B.  Haemophilus influenzae type b (Hib) vaccine. You may need this if you have certain conditions.  Talk to your health care provider about which screenings and vaccines you need and how often you need them. This information is not intended to replace advice given to you by your health care provider. Make sure you discuss any questions you have with your health care provider. Document Released: 10/24/2015 Document Revised: 06/16/2016 Document Reviewed: 07/29/2015 Elsevier Interactive Patient Education  [employer].

## 2017-11-01 ENCOUNTER — Encounter: Payer: Self-pay | Admitting: Cardiology

## 2017-11-03 ENCOUNTER — Ambulatory Visit (INDEPENDENT_AMBULATORY_CARE_PROVIDER_SITE_OTHER): Payer: BLUE CROSS/BLUE SHIELD | Admitting: Cardiology

## 2017-11-03 ENCOUNTER — Encounter: Payer: Self-pay | Admitting: Cardiology

## 2017-11-03 VITALS — BP 98/73 | HR 94 | Ht 74.0 in | Wt 185.0 lb

## 2017-11-03 DIAGNOSIS — Z7901 Long term (current) use of anticoagulants: Secondary | ICD-10-CM | POA: Diagnosis not present

## 2017-11-03 DIAGNOSIS — Z952 Presence of prosthetic heart valve: Secondary | ICD-10-CM

## 2017-11-03 DIAGNOSIS — R002 Palpitations: Secondary | ICD-10-CM | POA: Diagnosis not present

## 2017-11-03 DIAGNOSIS — I4949 Other premature depolarization: Secondary | ICD-10-CM

## 2017-11-03 DIAGNOSIS — Z87898 Personal history of other specified conditions: Secondary | ICD-10-CM | POA: Insufficient documentation

## 2017-11-03 DIAGNOSIS — Q8740 Marfan's syndrome, unspecified: Secondary | ICD-10-CM | POA: Diagnosis not present

## 2017-11-03 DIAGNOSIS — R55 Syncope and collapse: Secondary | ICD-10-CM | POA: Diagnosis not present

## 2017-11-03 DIAGNOSIS — Z9889 Other specified postprocedural states: Secondary | ICD-10-CM

## 2017-11-03 DIAGNOSIS — F3160 Bipolar disorder, current episode mixed, unspecified: Secondary | ICD-10-CM

## 2017-11-03 DIAGNOSIS — I493 Ventricular premature depolarization: Secondary | ICD-10-CM | POA: Insufficient documentation

## 2017-11-03 NOTE — Assessment & Plan Note (Signed)
Seen on EKG today

## 2017-11-03 NOTE — Assessment & Plan Note (Signed)
Episode of syncope two weeks ago sounds orthostatic.  B/P by me laying flat was 94 systolic

## 2017-11-03 NOTE — Progress Notes (Signed)
11/03/2017 Melissa Hayden   10/13/1975  [id number]  Primary Physician Brunetta Jeans, PA-C Primary Cardiologist: Dr Percival Spanish  HPI:  42 y.o. female with a history of Marfan's. In 2015 because of increasing dyspnea and a 5.6 cm aortic root with an ejection fraction of 35% and severe MR on echo, at the age of 49, she had surgery by Drs Ysidro Evert and [place] at [facility].  Of note at the time of preoperative evaluation for left and right heart cath she was not found to have any coronary disease.  She ultimately had aortic valve conduit root replacement with coronary reconstruction.(button Bentall) she had a #23 mm St. Jude mechanical aortic valve replacement and a #25 St. Jude mechanical mitral valve replacement. I was able to review these records on Care Everywhere.  She saw Dr Percival Spanish once in Feb 2018 to get established here with a cardiologist. A CTA was done 12/06/16 and showed -"Status post mitral an aortic valve replacement and aortic root replacement without complicating factors". Her PCP has been following her INR.    After this she lost her job for a time and did not follow up. She also carries a diagnosis of bipolar disease and depression and is followed by Dr Berniece Andreas. 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 She has had some orthostatic symptoms and two weeks ago she had a, actual  syncopal spell getting off the toilet. She saw her PCP yesterday and her EKG showed PVCs.  He suggested f/u here today.   The pt denies any unusual chest pain. She does get dizzy if she stands up too fast but has not had any further episodes of syncope. She does have palpitations and tachycardia at times though it usually lasts less than a minute. She admits she drinks a lot of coffee and has a [employer] in her purse now.    Current Outpatient Medications  Medication Sig Dispense Refill  . amoxicillin (AMOXIL) 875 MG tablet Take 1 tablet (875 mg total) by mouth  2 (two) times daily. 20 tablet 0  . cetirizine (ZYRTEC ALLERGY) 10 MG tablet Take 1 tablet (10 mg total) by mouth daily. 90 tablet 1  . citalopram (CELEXA) 20 MG tablet Take 1 tablet (20 mg total) by mouth daily. 90 tablet 1  . medroxyPROGESTERone (DEPO-PROVERA) 150 MG/ML injection Inject 150 mg into the muscle.    . Melatonin 10 MG TABS Take 50 mg by mouth daily. 90 tablet 1  . OLANZapine (ZYPREXA) 15 MG tablet Take 1 tablet (15 mg total) by mouth at bedtime. 90 tablet 0  . OVER THE COUNTER MEDICATION Multivitamin/Iron/Folic Acid (Centrum Ultra Women's Oral)-Take 1 tablet by mouth daily.    . traZODone (DESYREL) 50 MG tablet Take 1 tablet (50 mg total) by mouth at bedtime as needed for sleep. 90 tablet 0  . warfarin (COUMADIN) 5 MG tablet Take 1 tablet (5 mg) as directed on M, W, F. Takes 7.5 mg on other days 30 tablet 3  . warfarin (COUMADIN) 7.5 MG tablet Take 1 tablet (7.5 mg) on T, Th, Sa, Su. Takes 5 mg on other days. 30 tablet 3   No current facility-administered medications for this visit.     No Known Allergies  Past Medical History:  Diagnosis Date  . Anticoagulant long-term use    after valve surgery for Marfan's syndrome  . Anxiety   . Ascending aorta dilatation (HCC)   .  Bipolar disorder ([place])   . Depression   . Marfan's syndrome    Dr. Atilano Median  . Migraine   . Mitral valve prolapse   . Palpitations   . Suicidal behavior    Tylenol Overdose  . Varicose veins     Social History   Socioeconomic History  . Marital status: Divorced    Spouse name: Not on file  . Number of children: Not on file  . Years of education: 60  . Highest education level: Not on file  Social Needs  . Financial resource strain: Not on file  . Food insecurity - worry: Not on file  . Food insecurity - inability: Not on file  . Transportation needs - medical: Not on file  . Transportation needs - non-medical: Not on file  Occupational History  . Occupation: [employer]  Tobacco Use  . Smoking  status: Current Every Day Smoker    Packs/day: 2.00    Years: 18.00    Pack years: 36.00    Types: Cigarettes    Last attempt to quit: 04/10/2013    Years since quitting: 4.5  . Smokeless tobacco: Current User  . Tobacco comment: Currently using e-cigarrettes to help her quit smoking   Substance and Sexual Activity  . Alcohol use: No  . Drug use: No  . Sexual activity: Not Currently  Other Topics Concern  . 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  Off and on    Alcohol Use:  None   Drug Use:  None   Diet:  Regular   Exercise:  None   Hobbies:  Reading, Traveling               Family History  Problem Relation Age of Onset  . Cancer Mother 75       Breast   . Marfan syndrome Father   . Heart disease Father   . Marfan syndrome Sister   . Marfan syndrome Paternal Grandmother   . Marfan syndrome Sister      Review of Systems: General: negative for chills, fever, night sweats or weight changes.  Cardiovascular: negative for chest pain, dyspnea on exertion, edema, orthopnea, palpitations, paroxysmal nocturnal dyspnea or shortness of breath Dermatological: negative for rash Respiratory: negative for cough or wheezing Urologic: negative for hematuria Abdominal: negative for nausea, vomiting, diarrhea, bright red blood per rectum, melena, or hematemesis Neurologic: negative for visual changes, syncope, or dizziness All other systems reviewed and are otherwise negative except as noted above.    Blood pressure 98/73, pulse 94, height 6\' 2"  (1.88 m), weight 185 lb (83.9 kg), SpO2 97 %.  General appearance: alert, cooperative, no distress and tall, thin Neck: no carotid bruit and no JVD Lungs: clear to auscultation bilaterally and pectus noted Heart: regular rate and rhythm and positive valve  sounds Extremities: long, thin fingers Skin: Skin color, texture, turgor normal. No rashes or lesions Neurologic: Grossly normal  EKG NSR, frequent PVCs, one couplet, LVH with repol changes  ASSESSMENT AND PLAN:   Marfan's disease S/P St Jude MVR, St Jude AVR, and Bentall procedure Dec 2015 at [facility] CTA  Feb 2018- stable  Syncope and collapse Episode of syncope two weeks ago sounds orthostatic.  B/P by me laying flat was 94  systolic  Palpitations R/O arrythmia  Multifocal PVCs with pairing Seen on EKG today  Long term (current) use of anticoagulants Chronic Coumadin followed by PCP  Bipolar disorder, mixed ([place]) Followed by Dr Loni Muse. Syed   PLAN  I ordered an echo and two week event monitor. I suggested she cut down on her caffeine and make sure she stays hydrated and to always get up slowly. Dr Percival Spanish had recommended CVTS consult  For post valve placement follow up last year and I'll go ahead and arrange that now.   Kerin Ransom PA-C 11/03/2017 3:48 PM

## 2017-11-03 NOTE — Assessment & Plan Note (Signed)
Chronic Coumadin followed by PCP

## 2017-11-03 NOTE — Patient Instructions (Signed)
Medication Instructions:  Your physician recommends that you continue on your current medications as directed. Please refer to the Current Medication list given to you today.  Testing/Procedures: Your physician has requested that you have an echocardiogram. Echocardiography is a painless test that uses sound waves to create images of your heart. It provides your doctor with information about the size and shape of your heart and how well your heart's chambers and valves are working. This procedure takes approximately one hour. There are no restrictions for this procedure.  This will be done at our [facility] location:  [place] has recommended that you wear an event monitor (2 week). Event monitors are medical devices that record the heart's electrical activity. Doctors most often [name] these monitors to diagnose arrhythmias. Arrhythmias are problems with the speed or rhythm of the heartbeat. The monitor is a small, portable device. You can wear one while you do your normal daily activities. This is usually used to diagnose what is causing palpitations/syncope (passing out).  Follow-Up: Your physician recommends that you schedule a follow-up appointment in: Dr. Percival Spanish after testing  You have been referred to Dr. Servando Snare (cardiothoracic surgeon)-their office will contact you to get appointment scheduled  Any Other Special Instructions Will Be Listed Below (If Applicable).     If you need a refill on your cardiac medications before your next appointment, please call your pharmacy.

## 2017-11-03 NOTE — Assessment & Plan Note (Signed)
Followed by Dr A. Dossie Der

## 2017-11-03 NOTE — Assessment & Plan Note (Signed)
R/O arrythmia

## 2017-11-03 NOTE — Assessment & Plan Note (Signed)
S/P St Jude MVR, St Jude AVR, and Bentall procedure Dec 2015 at [facility] CTA  Feb 2018- stable

## 2017-11-07 ENCOUNTER — Ambulatory Visit: Payer: Self-pay | Admitting: Physician Assistant

## 2017-11-07 DIAGNOSIS — Z0000 Encounter for general adult medical examination without abnormal findings: Secondary | ICD-10-CM | POA: Insufficient documentation

## 2017-11-07 DIAGNOSIS — Z1239 Encounter for other screening for malignant neoplasm of breast: Secondary | ICD-10-CM | POA: Insufficient documentation

## 2017-11-07 DIAGNOSIS — I499 Cardiac arrhythmia, unspecified: Secondary | ICD-10-CM | POA: Insufficient documentation

## 2017-11-07 NOTE — Assessment & Plan Note (Addendum)
EKG revealing Sinus  Rhythm - frequent multiform ectopic ventricular beats. Asymptomatic. She has not followed up with Cardiology since last visit 1 year ago. Appointment scheduled with Cardiologist this week for further assessment. Strict ER precautions given to patient.

## 2017-11-07 NOTE — Assessment & Plan Note (Signed)
Depression screen negative. Health Maintenance reviewed. Preventive schedule discussed and handout given in AVS. Will obtain fasting labs today.  

## 2017-11-07 NOTE — Assessment & Plan Note (Addendum)
Repeat INR today at 4.4. She is to hold today's dose and take 5 mg tomorrow. Then resume her regular regimen. Repeat INR 1 week.

## 2017-11-07 NOTE — Assessment & Plan Note (Signed)
Endorses doing very well presently. Continue current regimen.

## 2017-11-07 NOTE — Assessment & Plan Note (Addendum)
Order for 3D screening mammogram placed. Patient to return next week for breast exam and pelvic exam with PAP once she is off of her menstrual period.

## 2017-11-09 ENCOUNTER — Encounter: Payer: Self-pay | Admitting: Physician Assistant

## 2017-11-09 ENCOUNTER — Other Ambulatory Visit (HOSPITAL_COMMUNITY)
Admission: RE | Admit: 2017-11-09 | Discharge: 2017-11-09 | Disposition: A | Discharge status: Home / Self Care | Payer: BLUE CROSS/BLUE SHIELD | Source: Ambulatory Visit | Attending: Physician Assistant | Admitting: Physician Assistant

## 2017-11-09 ENCOUNTER — Ambulatory Visit: Payer: BLUE CROSS/BLUE SHIELD | Admitting: Physician Assistant

## 2017-11-09 ENCOUNTER — Other Ambulatory Visit: Payer: Self-pay

## 2017-11-09 VITALS — BP 118/78 | HR 78 | Temp 98.1°F | Resp 14 | Ht 74.0 in | Wt 186.0 lb

## 2017-11-09 DIAGNOSIS — Z01419 Encounter for gynecological examination (general) (routine) without abnormal findings: Secondary | ICD-10-CM | POA: Insufficient documentation

## 2017-11-09 DIAGNOSIS — N926 Irregular menstruation, unspecified: Secondary | ICD-10-CM | POA: Diagnosis not present

## 2017-11-09 DIAGNOSIS — Z7901 Long term (current) use of anticoagulants: Secondary | ICD-10-CM

## 2017-11-09 DIAGNOSIS — N898 Other specified noninflammatory disorders of vagina: Secondary | ICD-10-CM

## 2017-11-09 LAB — POCT INR: INR: 5.8

## 2017-11-09 NOTE — Progress Notes (Signed)
Patient presents to clinic today for repeat INR check and routine gynecologic examination and PAP smear.   INR today at 5.8. Worse than last check. She has just completed antibiotics for sinusitis. Is keeping a well-balanced diet.   Patient's LMP finished 11/08/17. Denies pelvic concerns. Mammogram scheduled.   Past Medical History:  Diagnosis Date  . Anticoagulant long-term use    after valve surgery for Marfan's syndrome  . Anxiety   . Ascending aorta dilatation (HCC)   . Bipolar disorder ([place])   . Depression   . Marfan's syndrome    Dr. Atilano Median  . Migraine   . Mitral valve prolapse   . Palpitations   . Suicidal behavior    Tylenol Overdose  . Varicose veins     Current Outpatient Medications on File Prior to Visit  Medication Sig Dispense Refill  . cetirizine (ZYRTEC ALLERGY) 10 MG tablet Take 1 tablet (10 mg total) by mouth daily. 90 tablet 1  . citalopram (CELEXA) 20 MG tablet Take 1 tablet (20 mg total) by mouth daily. 90 tablet 1  . medroxyPROGESTERone (DEPO-PROVERA) 150 MG/ML injection Inject 150 mg into the muscle.    . Melatonin 10 MG TABS Take 50 mg by mouth daily. 90 tablet 1  . OLANZapine (ZYPREXA) 15 MG tablet Take 1 tablet (15 mg total) by mouth at bedtime. 90 tablet 0  . OVER THE COUNTER MEDICATION Multivitamin/Iron/Folic Acid (Centrum Ultra Women's Oral)-Take 1 tablet by mouth daily.    . traZODone (DESYREL) 50 MG tablet Take 1 tablet (50 mg total) by mouth at bedtime as needed for sleep. 90 tablet 0  . warfarin (COUMADIN) 5 MG tablet Take 1 tablet (5 mg) as directed on M, W, F. Takes 7.5 mg on other days 30 tablet 3  . warfarin (COUMADIN) 7.5 MG tablet Take 1 tablet (7.5 mg) on T, Th, Sa, Su. Takes 5 mg on other days. 30 tablet 3   No current facility-administered medications on file prior to visit.     No Known Allergies  Family History  Problem Relation Age of Onset  . Cancer Mother 61       Breast   . Marfan syndrome Father   . Heart disease  Father   . Marfan syndrome Sister   . Marfan syndrome Paternal Grandmother   . Marfan syndrome Sister     Social History   Socioeconomic History  . Marital status: Divorced    Spouse name: None  . Number of children: None  . Years of education: 1  . Highest education level: None  Social Needs  . Financial resource strain: None  . Food insecurity - worry: None  . Food insecurity - inability: None  . Transportation needs - medical: None  . Transportation needs - non-medical: None  Occupational History  . Occupation: [employer]  Tobacco Use  . Smoking status: Current Every Day Smoker    Packs/day: 2.00    Years: 18.00    Pack years: 36.00    Types: Cigarettes    Last attempt to quit: 04/10/2013    Years since quitting: 4.5  . Smokeless tobacco: Current User  . Tobacco comment: Currently using e-cigarrettes to help her quit smoking   Substance and Sexual Activity  . Alcohol use: No  . Drug use: No  . Sexual activity: Not Currently  Other Topics Concern  . 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  Off and on    Alcohol Use:  None   Drug Use:  None   Diet:  Regular   Exercise:  None   Hobbies:  Reading, Traveling             Review of Systems - See HPI.  All other ROS are negative.  BP 118/78   Pulse 78   Temp 98.1 F (36.7 C) (Oral)   Resp 14   Ht 6\' 2"  (1.88 m)   Wt 186 lb (84.4 kg)   LMP 11/05/2017   SpO2 99%   BMI 23.88 kg/m   Physical Exam  Constitutional: She is well-developed, well-nourished, and in no distress.  HENT:  Head: Normocephalic and atraumatic.  Eyes: Conjunctivae are normal.  Pulmonary/Chest: Effort normal.  Genitourinary: Vagina normal, uterus normal, cervix normal, right adnexa normal and left adnexa normal.  Genitourinary Comments: Chaperones present for  examination. Flat lesion adjacent to right labia, skin tag versus wart.  Skin: Skin is warm and dry.  Vitals reviewed.   Recent Results (from the past 2160 hour(s))  POCT INR     Status: Abnormal   Collection Time: 10/18/17 10:37 AM  Result Value Ref Range   INR 6.8   POCT INR     Status: Abnormal   Collection Time: 10/20/17  9:25 AM  Result Value Ref Range   INR 4.7   POCT INR     Status: Abnormal   Collection Time: 10/31/17  3:16 PM  Result Value Ref Range   INR 4.4   CBC with Differential/Platelet     Status: Abnormal   Collection Time: 10/31/17  3:26 PM  Result Value Ref Range   WBC 7.7 4.0 - 10.5 K/uL   RBC 4.98 3.87 - 5.11 Mil/uL   Hemoglobin 14.4 12.0 - 15.0 g/dL   HCT 44.0 36.0 - 46.0 %   MCV 88.4 78.0 - 100.0 fl   MCHC 32.7 30.0 - 36.0 g/dL   RDW 16.1 (H) 11.5 - 15.5 %   Platelets 244.0 150.0 - 400.0 K/uL   Neutrophils Relative % 69.6 43.0 - 77.0 %   Lymphocytes Relative 23.0 12.0 - 46.0 %   Monocytes Relative 5.6 3.0 - 12.0 %   Eosinophils Relative 1.4 0.0 - 5.0 %   Basophils Relative 0.4 0.0 - 3.0 %   Neutro Abs 5.4 1.4 - 7.7 K/uL   Lymphs Abs 1.8 0.7 - 4.0 K/uL   Monocytes Absolute 0.4 0.1 - 1.0 K/uL   Eosinophils Absolute 0.1 0.0 - 0.7 K/uL   Basophils Absolute 0.0 0.0 - 0.1 K/uL  Comprehensive metabolic panel     Status: None   Collection Time: 10/31/17  3:26 PM  Result Value Ref Range   Sodium 140 135 - 145 mEq/L   Potassium 4.5 3.5 - 5.1 mEq/L   Chloride 105 96 - 112 mEq/L   CO2 28 19 - 32 mEq/L   Glucose, Bld 82 70 - 99 mg/dL   BUN 11 6 - 23 mg/dL   Creatinine, Ser 0.71 0.40 - 1.20 mg/dL   Total Bilirubin 0.8 0.2 - 1.2 mg/dL   Alkaline Phosphatase 55 39 - 117 U/L   AST 14 0 - 37 U/L   ALT 6 0 - 35 U/L   Total Protein 6.6 6.0 - 8.3 g/dL   Albumin 4.3 3.5 - 5.2  g/dL   Calcium 9.3 8.4 - 10.5 mg/dL   GFR 96.38 >60.00 mL/min  Lipid panel     Status: Abnormal   Collection Time: 10/31/17  3:26 PM  Result Value Ref Range   Cholesterol 244 (H) 0 -  200 mg/dL    Comment: ATP III Classification       Desirable:  < 200 mg/dL               Borderline High:  200 - 239 mg/dL          High:  > = 240 mg/dL   Triglycerides 160.0 (H) 0.0 - 149.0 mg/dL    Comment: Normal:  <150 mg/dLBorderline High:  150 - 199 mg/dL   HDL 44.70 >39.00 mg/dL   VLDL 32.0 0.0 - 40.0 mg/dL   LDL Cholesterol 167 (H) 0 - 99 mg/dL   Total CHOL/HDL Ratio 5     Comment:                Men          Women1/2 Average Risk     3.4          3.3Average Risk          5.0          4.42X Average Risk          9.6          7.13X Average Risk          15.0          11.0                       NonHDL 198.80     Comment: NOTE:  Non-HDL goal should be 30 mg/dL higher than patient's LDL goal (i.e. LDL goal of < 70 mg/dL, would have non-HDL goal of < 100 mg/dL)  Hemoglobin A1c     Status: None   Collection Time: 10/31/17  3:26 PM  Result Value Ref Range   Hgb A1c MFr Bld 5.2 4.6 - 6.5 %    Comment: Glycemic Control Guidelines for People with Diabetes:Non Diabetic:  <6%Goal of Therapy: <7%Additional Action Suggested:  >8%   TSH     Status: None   Collection Time: 10/31/17  3:26 PM  Result Value Ref Range   TSH 1.66 0.35 - 4.50 uIU/mL    Assessment/Plan: 1. Vaginal lesion Skin tag versus wart. Favor latter.Will sent to Gynecology for further management giving sensitive area. PAP today with HPV testing. - Ambulatory referral to Gynecology  2. Pap smear, as part of routine gynecological examination - Cytology - PAP  3. Chronic anticoagulation INR at 5.8. Has finished antibiotic. Holding coumadin x 2 days. Then resume at 5 mg daily. Follow-up with coumadin clinic in 1 week. - POCT INR  4. Irregular menstrual cycle - Ambulatory referral to Gynecology   Leeanne Rio, PA-C

## 2017-11-09 NOTE — Patient Instructions (Signed)
Please hold coumadin today and tomorrow. Then resume but at 5 mg daily. Schedule a follow-up with the coumadin clinic in 1 week.   Exam today is good. I do see the lesion of concern. I am going to have you set up with GYN for assessment and to discuss contraception.

## 2017-11-11 ENCOUNTER — Ambulatory Visit: Payer: Self-pay | Admitting: Physician Assistant

## 2017-11-11 LAB — CYTOLOGY - PAP
DIAGNOSIS: NEGATIVE
HPV (WINDOPATH): NOT DETECTED

## 2017-11-14 ENCOUNTER — Other Ambulatory Visit: Payer: Self-pay | Admitting: Cardiology

## 2017-11-14 ENCOUNTER — Ambulatory Visit (HOSPITAL_COMMUNITY): Payer: BLUE CROSS/BLUE SHIELD | Attending: Cardiovascular Disease

## 2017-11-14 ENCOUNTER — Encounter: Payer: Self-pay | Admitting: *Deleted

## 2017-11-14 ENCOUNTER — Other Ambulatory Visit: Payer: Self-pay

## 2017-11-14 ENCOUNTER — Ambulatory Visit (INDEPENDENT_AMBULATORY_CARE_PROVIDER_SITE_OTHER): Payer: BLUE CROSS/BLUE SHIELD

## 2017-11-14 DIAGNOSIS — Z952 Presence of prosthetic heart valve: Secondary | ICD-10-CM

## 2017-11-14 DIAGNOSIS — I493 Ventricular premature depolarization: Secondary | ICD-10-CM | POA: Diagnosis not present

## 2017-11-14 DIAGNOSIS — I071 Rheumatic tricuspid insufficiency: Secondary | ICD-10-CM | POA: Diagnosis not present

## 2017-11-14 DIAGNOSIS — R55 Syncope and collapse: Secondary | ICD-10-CM | POA: Insufficient documentation

## 2017-11-14 DIAGNOSIS — R002 Palpitations: Secondary | ICD-10-CM | POA: Diagnosis not present

## 2017-11-16 ENCOUNTER — Ambulatory Visit (INDEPENDENT_AMBULATORY_CARE_PROVIDER_SITE_OTHER): Payer: BLUE CROSS/BLUE SHIELD | Admitting: Cardiology

## 2017-11-16 ENCOUNTER — Encounter: Payer: Self-pay | Admitting: Cardiology

## 2017-11-16 ENCOUNTER — Telehealth: Payer: Self-pay

## 2017-11-16 VITALS — BP 115/60 | HR 75 | Ht 74.0 in | Wt 186.6 lb

## 2017-11-16 DIAGNOSIS — F3160 Bipolar disorder, current episode mixed, unspecified: Secondary | ICD-10-CM | POA: Diagnosis not present

## 2017-11-16 DIAGNOSIS — Z7901 Long term (current) use of anticoagulants: Secondary | ICD-10-CM

## 2017-11-16 DIAGNOSIS — Q8740 Marfan's syndrome, unspecified: Secondary | ICD-10-CM | POA: Diagnosis not present

## 2017-11-16 DIAGNOSIS — Z87898 Personal history of other specified conditions: Secondary | ICD-10-CM

## 2017-11-16 DIAGNOSIS — I493 Ventricular premature depolarization: Secondary | ICD-10-CM

## 2017-11-16 DIAGNOSIS — I4949 Other premature depolarization: Secondary | ICD-10-CM | POA: Diagnosis not present

## 2017-11-16 DIAGNOSIS — I428 Other cardiomyopathies: Secondary | ICD-10-CM | POA: Diagnosis not present

## 2017-11-16 MED ORDER — CARVEDILOL 3.125 MG PO TABS: 3.1250 mg | ORAL_TABLET | Freq: Two times a day (BID) | ORAL | 6 refills | Status: DC

## 2017-11-16 NOTE — Progress Notes (Signed)
11/16/2017 Melissa Hayden   [birth year]  [id number]  Primary Physician Brunetta Jeans, PA-C Primary Cardiologist: dr hochrein  HPI:  Pleasant 42 y/o female with a history of Marfan's. In 2015 because of increasing dyspnea and a 5.6 cm aortic root with an ejection fraction of 35% and severe MR on echo, at the age of 80, she had surgery by Drs Ysidro Evert and [place] at [facility].Of note at the time of her preoperative evaluation for left and right heart cath she was not found to have any coronary disease. She ultimately had aortic valve conduit root replacement with coronary reconstruction.(button Bentall)she had a #23 mm St. Jude mechanical aortic valve replacement and a #25 St. Jude mechanical mitral valve replacement.I was able to review these records [employer]She saw Dr Percival Spanish once in Feb 2018 to get established here with a cardiologist. A CTA was done 12/06/16 and showed -"Status post mitral an aortic valve replacement and aortic root replacement without complicating factors". Her PCP has been following her INR.   After this she lost her job for a time and did not follow up. She also carries a diagnosis of bipolar disease and depression and is followed by Dr Berniece Andreas. She is now back to work and has insurance coverage and saw me 11/03/17 as a referral from her PCP for syncope. About two weeks prior she had a syncopal spell getting up off the toilet. Her EKG showed PVC's and couplets. She admitted to high caffine intake. I suggested an echo and event monitor as well as a CVTS consult to be established there as well.   Today the monitor service called the triage nurse and reported the pt had "5 beats of NSVT". She was added on to my schedule. Later in the day I was able to review these strips and I see PVCs and couplets but I'm not convinced she NSVT, I think it was artifact. She did have her echo done and I reviewed this., her valves are functioning normally but she has LVD with an EF of  30-35%.     Current Outpatient Medications  Medication Sig Dispense Refill  . cetirizine (ZYRTEC ALLERGY) 10 MG tablet Take 1 tablet (10 mg total) by mouth daily. 90 tablet 1  . citalopram (CELEXA) 20 MG tablet Take 1 tablet (20 mg total) by mouth daily. 90 tablet 1  . medroxyPROGESTERone (DEPO-PROVERA) 150 MG/ML injection Inject 150 mg into the muscle.    . Melatonin 10 MG TABS Take 50 mg by mouth daily. 90 tablet 1  . OLANZapine (ZYPREXA) 15 MG tablet Take 1 tablet (15 mg total) by mouth at bedtime. 90 tablet 0  . OVER THE COUNTER MEDICATION Multivitamin/Iron/Folic Acid (Centrum Ultra Women's Oral)-Take 1 tablet by mouth daily.    . traZODone (DESYREL) 50 MG tablet Take 1 tablet (50 mg total) by mouth at bedtime as needed for sleep. 90 tablet 0  . warfarin (COUMADIN) 5 MG tablet Take 1 tablet (5 mg) as directed on M, W, F. Takes 7.5 mg on other days 30 tablet 3  . warfarin (COUMADIN) 7.5 MG tablet Take 1 tablet (7.5 mg) on T, Th, Sa, Su. Takes 5 mg on other days. 30 tablet 3   No current facility-administered medications for this visit.     No Known Allergies  Past Medical History:  Diagnosis Date  . Anticoagulant long-term use    after valve surgery for Marfan's syndrome  . Anxiety   . Ascending aorta dilatation (HCC)   .  Bipolar disorder ([place])   . Depression   . Marfan's syndrome    Dr. Atilano Median  . Migraine   . Mitral valve prolapse   . Palpitations   . Suicidal behavior    Tylenol Overdose  . Varicose veins     Social History   Socioeconomic History  . Marital status: Divorced    Spouse name: Not on file  . Number of children: Not on file  . Years of education: 66  . Highest education level: Not on file  Social Needs  . Financial resource strain: Not on file  . Food insecurity - worry: Not on file  . Food insecurity - inability: Not on file  . Transportation needs - medical: Not on file  . Transportation needs - non-medical: Not on file  Occupational History  .  Occupation: [employer]  Tobacco Use  . Smoking status: Current Every Day Smoker    Packs/day: 2.00    Years: 18.00    Pack years: 36.00    Types: Cigarettes    Last attempt to quit: 04/10/2013    Years since quitting: 4.6  . Smokeless tobacco: Current User  . Tobacco comment: Currently using e-cigarrettes to help her quit smoking   Substance and Sexual Activity  . Alcohol use: No  . Drug use: No  . Sexual activity: Not Currently  Other Topics Concern  . 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  Off and on    Alcohol Use:  None   Drug Use:  None   Diet:  Regular   Exercise:  None   Hobbies:  Reading, Traveling               Family History  Problem Relation Age of Onset  . Cancer Mother 50       Breast   . Marfan syndrome Father   . Heart disease Father   . Marfan syndrome Sister   . Marfan syndrome Paternal Grandmother   . Marfan syndrome Sister      Review of Systems: General: negative for chills, fever, night sweats or weight changes.  Cardiovascular: negative for chest pain, dyspnea on exertion, edema, orthopnea, palpitations, paroxysmal nocturnal dyspnea or shortness of breath Dermatological: negative for rash Respiratory: negative for cough or wheezing Urologic: negative for hematuria Abdominal: negative for nausea, vomiting, diarrhea, bright red blood per rectum, melena, or hematemesis Neurologic: negative for visual changes, syncope, or dizziness All other systems reviewed and are otherwise negative except as noted above.    Blood pressure 115/60, pulse 75, height 6\' 2"  (1.88 m), weight 186 lb 9.6 oz (84.6 kg), last menstrual period 11/05/2017.  General appearance: alert, cooperative, no distress and tall, thin Neck: no carotid bruit and no JVD Lungs: clear to auscultation  bilaterally Heart: regular rate and rhythm and positive valve clicks Skin: Skin color, texture, turgor normal. No rashes or lesions Neurologic: Grossly normal   ASSESSMENT AND PLAN:   Marfan's disease S/P St Jude MVR, St Jude AVR, and Bentall procedure Dec 2015 at [facility] CTA  Feb 2018- stable  Syncope and collapse Episode of syncope two weeks ago sounds orthostatic.  Her B/P when I saw her 11/03/17 was low, better today.  Palpitations R/O arrhythmia. She has cut back on her  caffiene  Multifocal PVCs with pairing So far Holter shows PVCs and couplets, I'm not convinced she had 5 beats NSVT, I think this was artifact.   NICM EF 30-35% by echo, normal coronaries pre valve surgery  Long term (current) use of anticoagulants Chronic Coumadin followed by PCP  Bipolar disorder, mixed ([place]) Followed by Dr Loni Muse. Syed   PLAN  I suggested we add low dose beta blocker. She'll continue to wear her monitor. She has a follow up with Dr Percival Spanish 12/09/17  Kerin Ransom PA-C 11/16/2017 2:07 PM

## 2017-11-16 NOTE — Telephone Encounter (Signed)
Received monitor notification. Called patient. Patient stated she was just sitting down watching a movie. Patient stated she has an appointment this afternoon with Dr. Percival Spanish. Informed patient to keep her appointment and will have Dr. Percival Spanish review report. Patient verbalized understanding.

## 2017-11-16 NOTE — Patient Instructions (Addendum)
Start Coreg 3.125 mg twice a day    Follow up appointment with Dr.Hochrein Friday 12/09/17 at 8:20 am

## 2017-11-16 NOTE — Progress Notes (Signed)
Thanks for seeing her.  I see that the only EF I saw in her chart was 30 -35% in my first note.  Looks like this is unchanged.   I will see her in a couple of weeks.

## 2017-11-17 ENCOUNTER — Ambulatory Visit: Payer: Self-pay | Admitting: Cardiology

## 2017-11-23 ENCOUNTER — Encounter: Payer: Self-pay | Admitting: Cardiothoracic Surgery

## 2017-11-29 ENCOUNTER — Ambulatory Visit: Payer: Self-pay | Admitting: Cardiology

## 2017-12-06 ENCOUNTER — Institutional Professional Consult (permissible substitution) (INDEPENDENT_AMBULATORY_CARE_PROVIDER_SITE_OTHER): Payer: BLUE CROSS/BLUE SHIELD | Admitting: Cardiothoracic Surgery

## 2017-12-06 ENCOUNTER — Other Ambulatory Visit: Payer: Self-pay

## 2017-12-06 ENCOUNTER — Encounter: Payer: Self-pay | Admitting: Cardiothoracic Surgery

## 2017-12-06 VITALS — BP 115/79 | HR 80 | Resp 18 | Ht 74.0 in | Wt 187.0 lb

## 2017-12-06 DIAGNOSIS — Z954 Presence of other heart-valve replacement: Secondary | ICD-10-CM | POA: Diagnosis not present

## 2017-12-06 DIAGNOSIS — Q8740 Marfan's syndrome, unspecified: Secondary | ICD-10-CM

## 2017-12-06 DIAGNOSIS — Z952 Presence of prosthetic heart valve: Secondary | ICD-10-CM | POA: Diagnosis not present

## 2017-12-06 NOTE — Progress Notes (Signed)
[address]       [place] [address]             [phone]                    Melissa Hayden [facility] Medical Record #[id number] Date of Birth: 07/15/1976  Referring: Melissa Breeding, MD Primary Care: Melissa Jeans, PA-C Primary Cardiologist: Melissa primary care provider on file.  Chief Complaint:    Chief Complaint  Patient presents with  . Mitral Regurgitation    New patient, s/p MVR/ AVR 2015 at [place],  CTA Chest 12/06/2017     History of Present Illness:    Melissa Hayden 42 y.o. female is seen in the office  today at the request of cardiology.  Has a diagnosis of Marfan's under went  Mechanical MVR with a #75mm St Jude expanded cuff and Bentall procedure with a # 52mm St. Jude mechanical valved conduit on 09/25/2014 by Dr Ysidro Evert. She notes one appointment to remove staples but has not been back.   She comes in today at the request of Dr Percival Spanish. Patient is primarily concerned about syncopal episode she had and episodes of skip beats and palpitations.  Monitor placed by cardiology shows runs of nonsustained ventricular cardia and frequent ventricular bigeminy.  Patient has been diligent about her Coumadin therapy.     Current Activity/ Functional Status:  Patient is independent with mobility/ambulation, transfers, ADL's, IADL's.   Zubrod Score: At the time of surgery this patient's most appropriate activity status/level should be described as: [x]     0    Normal activity, Melissa symptoms []     1    Restricted in physical strenuous activity but ambulatory, able to do out light work []     2    Ambulatory and capable of self care, unable to do work activities, up and about               >50 % of waking hours                              []     3    Only limited self care, in bed greater than 50% of waking hours []     4    Completely disabled, Melissa self care, confined to bed or chair []     5    Moribund   Past Medical History:  Diagnosis Date  .  Anticoagulant long-term use    after valve surgery for Marfan's syndrome  . Anxiety   . Ascending aorta dilatation (HCC)   . Bipolar disorder ([place])   . Depression   . Marfan's syndrome    Dr. Atilano Median  . Migraine   . Mitral valve prolapse   . Palpitations   . Suicidal behavior    Tylenol Overdose  . Varicose veins     Past Surgical History:  Procedure Laterality Date  . ANKLE SURGERY Left   . BREAST CYST EXCISION    . CARDIAC VALVE SURGERY  09/2014   AV, MV replacement, aortic root replacement (Dr [facility]sidro [facility]vert at [facility])  . INNER EAR SURGERY Right     Family History  Problem Relation Age of Onset  . Cancer Mother 37       Breast   . Marfan syndrome Father   . Heart disease Father   . Marfan syndrome Sister   . Marfan  syndrome Paternal Grandmother   . Marfan syndrome Sister     Social History   Socioeconomic History  . Marital status: Divorced    Spouse name: Not on file  . Number of children: Not on file  . Years of education: 62  . Highest education level: Not on file  Social Needs  . Financial resource strain: Not on file  . Food insecurity - worry: Not on file  . Food insecurity - inability: Not on file  . Transportation needs - medical: Not on file  . Transportation needs - non-medical: Not on file  Occupational History  . Occupation: [employer]  Tobacco Use  . Smoking status: Current Every Day Smoker    Packs/day: 2.00    Years: 18.00    Pack years: 36.00    Types: Cigarettes  . Smokeless tobacco: Current User  . Tobacco comment: Currently using e-cigarrettes to help her quit smoking   Substance and Sexual Activity  . Alcohol use: Melissa  . Drug use: Melissa  . Sexual activity: Not Currently  Other Topics Concern  . 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  Off and on    Alcohol Use:  None    Drug Use:  None   Diet:  Regular   Exercise:  None   Hobbies:  Reading, Traveling              Social History   Tobacco Use  Smoking Status Current Every Day Smoker  . Packs/day: 2.00  . Years: 18.00  . Pack years: 36.00  . Types: Cigarettes  Smokeless Tobacco Current User  Tobacco Comment   Currently using e-cigarrettes to help her quit smoking     Social History   Substance and Sexual Activity  Alcohol Use Melissa     Melissa Known Allergies  Current Outpatient Medications  Medication Sig Dispense Refill  . carvedilol (COREG) 3.125 MG tablet Take 1 tablet (3.125 mg total) by mouth 2 (two) times daily. 60 tablet 6  . cetirizine (ZYRTEC ALLERGY) 10 MG tablet Take 1 tablet (10 mg total) by mouth daily. 90 tablet 1  . citalopram (CELEXA) 20 MG tablet Take 1 tablet (20 mg total) by mouth daily. 90 tablet 1  . medroxyPROGESTERone (DEPO-PROVERA) 150 MG/ML injection Inject 150 mg into the muscle.    . Melatonin 10 MG TABS Take 50 mg by mouth daily. 90 tablet 1  . OLANZapine (ZYPREXA) 15 MG tablet Take 1 tablet (15 mg total) by mouth at bedtime. 90 tablet 0  . OVER THE COUNTER MEDICATION Multivitamin/Iron/Folic Acid (Centrum Ultra Women's Oral)-Take 1 tablet by mouth daily.    . traZODone (DESYREL) 50 MG tablet Take 1 tablet (50 mg total) by mouth at bedtime as needed for sleep. 90 tablet 0  . warfarin (COUMADIN) 5 MG tablet Take 1 tablet (5 mg) as directed on M, W, F. Takes 7.5 mg on other days 30 tablet 3  . warfarin (COUMADIN) 7.5 MG tablet Take 1 tablet (7.5 mg) on T, Th, Sa, Su. Takes 5 mg on other days. 30 tablet 3   Melissa current facility-administered medications for this visit.     Pertinent items are noted in HPI.   Review of Systems:     Cardiac Review of  Systems: [Y] = yes  or   [  ] = Melissa   Chest Pain [ n   ]  Resting SOB [n   ] Exertional SOB  [n  ]  Orthopnea Florencio.Farrier  ]   Pedal Edema [ nn  ]    Palpitations Blue.Reese  ] Syncope  [ y ]   Presyncope [ y  ]  General Review of Systems:  [Y] = yes [  ]=Melissa Constitional: recent weight change [  ];  Wt loss over the last 3 months [   ] anorexia [  ]; fatigue [n  ]; nausea [  n]; night sweats n[  ]; fever [  ]; or chills [  ];          Dental: poor dentition[  ]; Last Dentist visit: does not remember  Eye : blurred vision [  ]; diplopia [   ]; vision changes [n  ];  Amaurosis fugax[ n ]; Resp: cough [  n];  wheezing[n  ];  hemoptysis[n  ]; shortness of breath[  ]; paroxysmal nocturnal dyspnea[n  ]; dyspnea on exertion[  ]; or orthopnea[  ];  GI:  gallstones[  ], vomiting[n  ];  dysphagia[  ]; melena[  ];  hematochezia [n  ]; heartburn[ n ];   Hx of  Colonoscopy[n  ]; GU: kidney stones [  ]; hematuria[  ];   dysuria [  ];  nocturia[  ];  history of     obstruction [  ]; urinary frequency [n  ]             Skin: rash, swelling[  ];, hair loss[  ];  peripheral edema[  ];  or itching[  ]; Musculosketetal: myalgias[  ];  joint swelling[  ];  joint erythema[  ];  joint pain[ n ];  back pain[  ];  Heme/Lymph: bruising[  ];  bleeding[  ];  anemia[  ];  Neuro: TIA[  ];  headaches[  ];  stroke[ n ];  vertigo[  ];  seizures[n  ];   paresthesias[  ];  difficulty walking[  ];  Psych:depression[  ]; anxiety[  ];  Endocrine: diabetes[ n ];  thyroid dysfunction[n  ];  Immunizations: Flu up to date [  ]; Pneumococcal up to date [  ];  Other:  Physical Exam: BP 115/79 (BP Location: Left Arm, Patient Position: Sitting, Cuff Size: Large)   Pulse 80   Resp 18   Ht 6\' 2"  (1.88 m)   Wt 187 lb (84.8 kg)   SpO2 98% Comment: RA  BMI 24.01 kg/m   PHYSICAL EXAMINATION: General appearance: alert, cooperative, appears stated age and Patient is a tall thin and has body habitus  consistent with d Marfan's Head: Normocephalic, without obvious abnormality, atraumatic Neck: Melissa adenopathy, Melissa carotid bruit, Melissa JVD, supple, symmetrical, trachea midline and thyroid not enlarged, symmetric, Melissa tenderness/mass/nodules Lymph nodes: Cervical, supraclavicular, and  axillary nodes normal. Resp: clear to auscultation bilaterally Back: symmetric, Melissa curvature. ROM normal. Melissa CVA tenderness. Cardio: regular rate and rhythm, S1, S2 normal, Melissa murmur, click, rub or gallop and Crisp mechanical valve sounds without aortic or mitral insufficiency GI: soft, non-tender; bowel sounds normal; Melissa masses,  Melissa organomegaly Extremities: extremities normal, atraumatic, Melissa cyanosis or edema Neurologic: Grossly normal Arachnodactyly present Mild pectus carinatum , with palpable sternal wires but without errosion thumbs protrude with fist  Diagnostic Studies & Laboratory data:     Recent Radiology Findings:   CLINICAL  DATA:  History of aortic and mitral valve replacement with aortic root replacement, follow-up exam  EXAM: CT ANGIOGRAPHY CHEST WITH CONTRAST  TECHNIQUE: Multidetector CT imaging of the chest was performed using the standard protocol during bolus administration of intravenous contrast. Multiplanar CT image reconstructions and MIPs were obtained to evaluate the vascular anatomy.  CONTRAST:  100 mL Isovue 370.  COMPARISON:  None.  FINDINGS: Cardiovascular: There are changes consistent with mitral and aortic valve replacement. The ascending aorta shows changes of tube graft replacement. Melissa abnormality of the graft is seen. The more distal aorta shows a normal branching pattern with mild atherosclerotic change. The pulmonary artery shows Melissa definitive filling defects to suggest pulmonary emboli. Minimal coronary calcifications are seen in the left anterior descending coronary artery. Melissa significant cardiac enlargement is noted.  Mediastinum/Nodes: Thoracic inlet is within normal limits. Melissa significant mediastinal or hilar adenopathy is noted. The esophagus as visualized is within normal limits.  Lungs/Pleura: Emphysematous changes are seen without focal pulmonary nodules. Melissa focal infiltrate is noted.  Upper Abdomen: Within normal  limits.  Musculoskeletal: Changes of prior median sternotomy are noted. Melissa acute bony abnormality is seen.  Review of the MIP images confirms the above findings.  IMPRESSION: Status post mitral an aortic valve replacement and aortic root replacement without complicating factors.  COPD.  Melissa acute abnormality noted.   Electronically Signed   By: Inez Catalina M.D.   On: 12/06/2016 13:55  I have independently reviewed the above radiologic studies.  Recent Lab Findings: Lab Results  Component Value Date   WBC 7.7 10/31/2017   HGB 14.4 10/31/2017   HCT 44.0 10/31/2017   PLT 244.0 10/31/2017   GLUCOSE 82 10/31/2017   CHOL 244 (H) 10/31/2017   TRIG 160.0 (H) 10/31/2017   HDL 44.70 10/31/2017   LDLCALC 167 (H) 10/31/2017   ALT 6 10/31/2017   AST 14 10/31/2017   NA 140 10/31/2017   K 4.5 10/31/2017   CL 105 10/31/2017   CREATININE 0.71 10/31/2017   BUN 11 10/31/2017   CO2 28 10/31/2017   TSH 1.66 10/31/2017   INR 5.8 11/09/2017   HGBA1C 5.2 10/31/2017   ECHO: 11/2017 Transthoracic Echocardiography  Patient:    Melissa Hayden, Melissa Hayden MR #:       [id number] Study Date: 11/14/2017 Gender:     F Age:        41 Height:     188 cm Weight:     44.5 kg BSA:        1.49 m^2 Pt. Status: Room:   SONOGRAPHER  Charlann Noss, Powderly Croitoru, MD  PERFORMING   [facility], Outpatient  cc:  ------------------------------------------------------------------- LV EF: 30% -   35%  ------------------------------------------------------------------- Indications:      Syncope (R55).  ------------------------------------------------------------------- History:   PMH:  Acquired from the patient and from the patient&'s chart.  Syncope.  Aortic valve disease.  Mitral valve disease.  ------------------------------------------------------------------- Study Conclusions  - Left ventricle: The cavity size  was mildly dilated. Wall   thickness was normal. Systolic function was moderately to   severely reduced. The estimated ejection fraction was in the   range of 30% to 35%. Severe diffuse hypokinesis with Melissa   identifiable regional variations. The study is not technically   sufficient to allow evaluation of LV diastolic function. - Ventricular septum: Septal motion showed paradox. -  Aortic valve: A mechanical prosthesis was present and functioning   normally. - Mitral valve: A mechanical prosthesis was present and functioning   normally. - Left atrium: The atrium was moderately dilated.  ------------------------------------------------------------------- Labs, prior tests, procedures, and surgery: S/P St. Jude MV and AV replacement. Bentall procedure at [facility].  ------------------------------------------------------------------- Study data:  Melissa prior study was available for comparison.  Study status:  Routine.  Procedure:  The patient reported Melissa pain pre or post test. Transthoracic echocardiography. Image quality was adequate. The study was technically difficult, as a result of body habitus.  Study completion:  There were Melissa complications. Transthoracic echocardiography.  M-mode, complete 2D, spectral Doppler, and color Doppler.  Birthdate:  Patient birthdate: July 09, 1976.  Age:  Patient is 42 yr old.  Sex:  Gender: female. BMI: 12.6 kg/m^2.  Blood pressure:     98/73  Patient status: Outpatient.  Study date:  Study date: 11/14/2017. Study time: 11:43 AM.  Location:  [place] [facility]  -------------------------------------------------------------------  ------------------------------------------------------------------- Left ventricle:  The cavity size was mildly dilated. Wall thickness was normal. Systolic function was moderately to severely reduced. The estimated ejection fraction was in the range of 30% to 35%. Moderate to severe diffuse hypokinesis with Melissa  identifiable regional variations. The study is not technically sufficient to allow evaluation of LV diastolic function.  ------------------------------------------------------------------- Aortic valve:  A mechanical prosthesis was present and functioning normally.  Doppler:     Mean gradient (S): 12 mm Hg. Peak gradient (S): 22 mm Hg.  ------------------------------------------------------------------- Aorta:  S/P Bentall repair.  ------------------------------------------------------------------- Mitral valve:  A mechanical prosthesis was present and functioning normally.  Doppler:     Mean gradient (D): 4 mm Hg. Peak gradient (D): 7 mm Hg.  ------------------------------------------------------------------- Left atrium:  The atrium was moderately dilated.  ------------------------------------------------------------------- Right ventricle:  The cavity size was normal. Systolic function was normal.  ------------------------------------------------------------------- Ventricular septum:   Septal motion showed paradox.  ------------------------------------------------------------------- Pulmonic valve:    Structurally normal valve.   Cusp separation was normal.  Doppler:  Transvalvular velocity was within the normal range. There was Melissa regurgitation.  ------------------------------------------------------------------- Tricuspid valve:   Structurally normal valve.   Leaflet separation was normal.  Doppler:  Transvalvular velocity was within the normal range. There was mild regurgitation.  ------------------------------------------------------------------- Pulmonary artery:   Systolic pressure was within the normal range.   ------------------------------------------------------------------- Right atrium:  The atrium was normal in size.  ------------------------------------------------------------------- Pericardium:  There was Melissa pericardial  effusion.  ------------------------------------------------------------------- Systemic veins: Inferior vena cava: The vessel was normal in size. The respirophasic diameter changes were in the normal range (= 50%), consistent with normal central venous pressure.  ------------------------------------------------------------------- Post procedure conclusions Ascending Aorta:  - S/P Bentall repair.  ------------------------------------------------------------------- Measurements   Left ventricle                           Value        Reference  LV ID, ED, PLAX chordal          (H)     60.57 mm     43 - 52  LV ID, ES, PLAX chordal          (H)     49.22 mm     23 - 38  LV fx shortening, PLAX chordal   (L)     19    %      >=29  LV PW thickness, ED  11.92 mm     ----------  IVS/LV PW ratio, ED                      0.83         <=6.2  LV end-diastolic volume, 1-p I9S         252   ml     ----------  LV end-systolic volume, 1-p W5I          174   ml     ----------    Ventricular septum                       Value        Reference  IVS thickness, ED                        9.9   mm     ----------    Aortic valve                             Value        Reference  Aortic valve peak velocity, S            234   cm/s   ----------  Aortic valve mean velocity, S            156   cm/s   ----------  Aortic valve VTI, S                      49.7  cm     ----------  Aortic mean gradient, S                  12    mm Hg  ----------  Aortic peak gradient, S                  22    mm Hg  ----------    Aorta                                    Value        Reference  Aortic root ID, ED                       31    mm     ----------    Left atrium                              Value        Reference  LA ID, A-P, ES                           46    mm     ----------  LA ID/bsa, A-P                   (H)     3.09  cm/m^2 <=2.2  LA volume, S                             83.5  ml      ----------  LA volume/bsa, S  56    ml/m^2 ----------  LA volume, ES, 1-p A4C                   53.5  ml     ----------  LA volume/bsa, ES, 1-p A4C               35.9  ml/m^2 ----------  LA volume, ES, 1-p A2C                   108   ml     ----------  LA volume/bsa, ES, 1-p A2C               72.5  ml/m^2 ----------    Mitral valve                             Value        Reference  Mitral E-wave peak velocity              134   cm/s   ----------  Mitral A-wave peak velocity              160   cm/s   ----------  Mitral mean velocity, D                  92.1  cm/s   ----------  Mitral deceleration time         (H)     391   ms     150 - 230  Mitral mean gradient, D                  4     mm Hg  ----------  Mitral peak gradient, D                  7     mm Hg  ----------  Mitral E/A ratio, peak                   0.8          ----------  Mitral annulus VTI, D                    47.4  cm     ----------    Pulmonary arteries                       Value        Reference  PA pressure, S, DP                       15    mm Hg  <=30    Tricuspid valve                          Value        Reference  Tricuspid regurg peak velocity           176   cm/s   ----------  Tricuspid peak RV-RA gradient            12    mm Hg  ----------    Right atrium                             Value        Reference  RA ID, S-I, ES, A4C  41.3  mm     34 - 49  RA area, ES, A4C                         12.9  cm^2   8.3 - 19.5  RA volume, ES, A/L                       31.6  ml     ----------  RA volume/bsa, ES, A/L                   21.2  ml/m^2 ----------    Systemic veins                           Value        Reference  Estimated CVP                            3     mm Hg  ----------    Right ventricle                          Value        Reference  TAPSE                                    23.6  mm     ----------  RV pressure, S, DP                       15    mm Hg   <=30  Legend: (L)  and  (H)  mark values outside specified reference range.  ------------------------------------------------------------------- Prepared and Electronically Authenticated by  Sanda Klein, MD 2019-02-04T16:12:24  Assessment / Plan:   1/ stable following mechanical mitral valve replacement and replacement of aortic root and ascending aorta with mechanical valve conduit approximately 3 years ago current CT scan shows Melissa dilatation of the aortic arch or descending aorta. 2/ recent episode of syncope with frequent ventricular ectopy on recent, cardiac monitor-encourage patient to follow-up with cardiology concerning this 3 discussed with her the need for good dental care and endocarditis prophylaxis with dental and other invasive procedures  At this point Melissa need for further surgical follow-up. Close monitoring by cardiology concerning her depressed LV function by echocardiogram.     Patient was warned about not using Cipro and similar antibiotics. Recent studies have raised concern that fluoroquinolone antibiotics could be associated with an increased risk of aortic aneurysm Fluoroquinolones have non-antimicrobial properties that might jeopardise the integrity of the extracellular matrix of the vascular wall In a  propensity score matched cohort study in [place], there was a 66% increased rate of aortic aneurysm or dissection associated with oral fluoroquinolone use, compared with amoxicillin use, within a 60 day risk period from start of treatment    Grace Isaac MD      [address] [place] [address] Office [phone]   Beeper [phone]  12/06/2017 4:17 PM

## 2017-12-06 NOTE — Patient Instructions (Addendum)
Ascending Aortic Aneurysm/ Thoracic Aortic Aneurysm   Recent studies have raised concern that fluoroquinolone antibiotics could be associated with an increased risk of aortic aneurysm or aortic dissection. You should avoid use of Cipro and other associated antibiotics (flouroquinolone antibiotics )  It is  best to avoid activities that cause grunting or straining (medically referred to as a "valsalva maneuver"). This happens when a person bears down against a closed throat to increase the strength of arm or abdominal muscles. There's often a tendency to do this when lifting heavy weights, doing sit-ups, push-ups or chin-ups, etc., but it may be harmful.   Endocarditis Information  You may be at risk for developing endocarditis since you have  an artificial heart valve  or a repaired heart valve. Endocarditis is an infection of the lining of the heart or heart valves.   Certain surgical and dental procedures may put you at risk,  such as teeth cleaning or other dental procedures or any surgery involving the respiratory, urinary, gastrointestinal tract, gallbladder or prostate.   Notify your doctor or dentist before having any invasive procedures. You will need to take antibiotics before certain procedures.   To prevent endocarditis, maintain good oral health. Seek prompt medical attention for any mouth/gum, skin or urinary tract infections.

## 2017-12-07 NOTE — Progress Notes (Signed)
Cardiology Office Note   Date:  12/09/2017   ID:  [name], DOB [birth year], MRN [id number]  PCP:  Melissa Jeans, PA-C  Cardiologist:   Melissa Breeding, MD  Referring:  Melissa Jeans, PA-C  Chief Complaint  Patient presents with  . Loss of Consciousness      History of Present Illness: Melissa Hayden is a 42 y.o. female who presents for Follow-up of Marfan's. The patient had a known history of Marfan's. In 2015 because of increasing dyspnea and a 5.6 cm aortic root with an ejection fraction of 35% and severe MR on echo.  At the age of 38 she had surgery by Melissa Hayden and Melissa Hayden at [facility].   Of note at the time of preoperative evaluation for left and right heart cath she was not found to have any coronary disease.  She ultimately had aortic valve conduit root replacement with coronary reconstruction.(button Bentall) she had a #23 mm St. Jude history of present illness mechanical aortic valve replacement and a #25 St. Jude expanded cuff mechanical mitral valve replacement.   Aortic Valved Conduit root replacement with coronary reconstruction (button Bentall procedure); #29 mm St. Jude HP mechanical valved conduit .She has been on Coumadin and have this followed. Recently she saw Melissa Hayden.  She had a stable CT angio a few days ago.   She had syncope in Jan.  She wore a Holter and had NSVT and frequent ventricular ectopy.     She is had no further syncope.  She describes the episode as she got up to use the bathroom.  She sat down on the toilet.  She felt very dizzy.  Her hand was shaking.  Her heart was skipping erratically.  She got lightheaded.  She pumped her legs to get the blood flowing.  She stood up and walked out of the room and she had a syncopal episode.  She is continued to feel palpitations but she has not felt anything like that.  She does feel that sometimes she has several skipped beats together.  She is not describing any orthostatic symptoms.  She not having any chest  pressure, neck or arm discomfort.  She is having some mild shortness of breath but she can still walk four dogs.  She is not having any PND or orthopnea.   Past Medical History:  Diagnosis Date  . Anticoagulant long-term use    after valve surgery for Marfan's syndrome  . Anxiety   . Ascending aorta dilatation (HCC)   . Bipolar disorder (Melissa Hayden)   . Depression   . Marfan's syndrome    Melissa Hayden  . Migraine   . Mitral valve prolapse   . Palpitations   . Suicidal behavior    Melissa Hayden  . Varicose veins     Past Surgical History:  Procedure Laterality Date  . ANKLE SURGERY Left   . BREAST CYST EXCISION    . CARDIAC VALVE SURGERY  09/2014   AV, MV replacement, aortic root replacement (Dr Ysidro Hayden at [facility])  . INNER EAR SURGERY Right      Current Outpatient Medications  Medication Sig Dispense Refill  . carvedilol (COREG) 6.25 MG tablet Take 1 tablet (6.25 mg total) by mouth 2 (two) times daily. 60 tablet 6  . cetirizine (ZYRTEC ALLERGY) 10 MG tablet Take 1 tablet (10 mg total) by mouth daily. 90 tablet 1  . citalopram (CELEXA) 20 MG tablet Take 1 tablet (20 mg total) by mouth daily.  90 tablet 1  . medroxyPROGESTERone (DEPO-PROVERA) 150 MG/ML injection Inject 150 mg into the muscle.    . Melatonin 10 MG TABS Take 50 mg by mouth daily. 90 tablet 1  . OLANZapine (ZYPREXA) 15 MG tablet Take 1 tablet (15 mg total) by mouth at bedtime. 90 tablet 0  . OVER THE COUNTER MEDICATION Multivitamin/Iron/Folic Acid (Centrum Ultra Women's Oral)-Take 1 tablet by mouth daily.    . traZODone (DESYREL) 50 MG tablet Take 1 tablet (50 mg total) by mouth at bedtime as needed for sleep. 90 tablet 0  . warfarin (COUMADIN) 5 MG tablet Take 1 tablet (5 mg) as directed on M, W, F. Takes 7.5 mg on other days 30 tablet 3  . warfarin (COUMADIN) 7.5 MG tablet Take 1 tablet (7.5 mg) on T, Th, Sa, Su. Takes 5 mg on other days. 30 tablet 3   No current facility-administered medications for this visit.      Allergies:   Patient has no known allergies.    ROS:  Please see the history of present illness.   Otherwise, review of systems are positive for none.   All other systems are reviewed and negative.    PHYSICAL EXAM: VS:  BP 104/72   Pulse 72   Ht 6\' 2"  (1.88 m)   Wt 187 lb 12.8 oz (85.2 kg)   BMI 24.11 kg/m  , BMI Body mass index is 24.11 kg/m.  GENERAL:  Well appearing NECK:  No jugular venous distention, waveform within normal limits, carotid upstroke brisk and symmetric, no bruits, no thyromegaly LUNGS:  Clear to auscultation bilaterally CHEST: Well-healed surgical scar with pectus carinatum HEART:  PMI not displaced or sustained,S1 normal and mechanical S2 within normal limits, no S3, no S4, no clicks, no rubs, no murmurs ABD:  Flat, positive bowel sounds normal in frequency in pitch, no bruits, no rebound, no guarding, no midline pulsatile mass, no hepatomegaly, no splenomegaly EXT:  2 plus pulses throughout, no edema, no cyanosis no clubbing   EKG:  EKG  is ordered today. The ekg ordered today demonstrates sinus rhythm, rate 72, axis within normal limits, intervals within normal limits, premature ventricular contractions, no acute ST-T wave changes.  PVCs  Recent Labs: 10/31/2017: ALT 6; BUN 11; Creatinine, Ser 0.71; Hemoglobin 14.4; Platelets 244.0; Potassium 4.5; Sodium 140; TSH 1.66    Lipid Panel    Component Value Date/Time   CHOL 244 (H) 10/31/2017 1526   TRIG 160.0 (H) 10/31/2017 1526   HDL 44.70 10/31/2017 1526   CHOLHDL 5 10/31/2017 1526   VLDL 32.0 10/31/2017 1526   LDLCALC 167 (H) 10/31/2017 1526      Wt Readings from Last 3 Encounters:  12/09/17 187 lb 12.8 oz (85.2 kg)  12/06/17 187 lb (84.8 kg)  11/16/17 186 lb 9.6 oz (84.6 kg)      Other studies Reviewed: Additional studies/ records that were reviewed today include: Holter, CT surgery records and CT report Review of the above records demonstrates:      ASSESSMENT AND PLAN:  SYNCOPE: I  am concerned with the multi form ectopy, reduced EF and syncope that this was an arrhythmic event.  I have set her up to see Dr. Caryl Hayden for consideration of further EP study or ICD.  She and I discussed the possibility of ICD.    AVR/MVR Aortic Root Replacement:   This was stable as listed.  She understands SBE prophylaxis and is up to date with anticoagulation.    CM:  She does not have much BP to work with .  I am going to try to titrate her beta blocker.   MARFAN'S DISEASE:  She has had family genetic counseling per her report.  Current medicines are reviewed at length with the patient today.  The patient does not have concerns regarding medicines.  The following changes have been made:  As above.   Labs/ tests ordered today include:  None  Orders Placed This Encounter  Procedures  . EKG 12-Lead     Disposition:   FU with me in after she has seen Dr. Caryl Hayden.   Signed, Melissa Breeding, MD  12/09/2017 8:41 AM    [facility]

## 2017-12-09 ENCOUNTER — Encounter: Payer: Self-pay | Admitting: Cardiology

## 2017-12-09 ENCOUNTER — Ambulatory Visit (INDEPENDENT_AMBULATORY_CARE_PROVIDER_SITE_OTHER): Payer: BLUE CROSS/BLUE SHIELD | Admitting: Cardiology

## 2017-12-09 VITALS — BP 104/72 | HR 72 | Ht 74.0 in | Wt 187.8 lb

## 2017-12-09 DIAGNOSIS — R55 Syncope and collapse: Secondary | ICD-10-CM | POA: Diagnosis not present

## 2017-12-09 DIAGNOSIS — Z9889 Other specified postprocedural states: Secondary | ICD-10-CM | POA: Diagnosis not present

## 2017-12-09 DIAGNOSIS — I428 Other cardiomyopathies: Secondary | ICD-10-CM | POA: Diagnosis not present

## 2017-12-09 DIAGNOSIS — Q8740 Marfan's syndrome, unspecified: Secondary | ICD-10-CM | POA: Diagnosis not present

## 2017-12-09 DIAGNOSIS — I4729 Other ventricular tachycardia: Secondary | ICD-10-CM

## 2017-12-09 DIAGNOSIS — I472 Ventricular tachycardia: Secondary | ICD-10-CM | POA: Diagnosis not present

## 2017-12-09 DIAGNOSIS — Z952 Presence of prosthetic heart valve: Secondary | ICD-10-CM

## 2017-12-09 MED ORDER — CARVEDILOL 6.25 MG PO TABS: 6.2500 mg | ORAL_TABLET | Freq: Two times a day (BID) | ORAL | 6 refills | Status: DC

## 2017-12-09 NOTE — Patient Instructions (Signed)
Medication Instructions:  INCREASE- Carvedilol 6.25 mg twice a day  If you need a refill on your cardiac medications before your next appointment, please call your pharmacy.  Labwork: None Ordered    Testing/Procedures: None Ordered  Follow-Up: Your physician recommends that you schedule a follow-up appointment in: Tuesday @ 2:30 pm with Dr Caryl Comes     Thank you for choosing [facility] at [facility]!!

## 2017-12-12 ENCOUNTER — Telehealth: Payer: Self-pay

## 2017-12-12 NOTE — Telephone Encounter (Signed)
ERROR

## 2017-12-13 ENCOUNTER — Encounter: Payer: Self-pay | Admitting: Physician Assistant

## 2017-12-13 ENCOUNTER — Ambulatory Visit (INDEPENDENT_AMBULATORY_CARE_PROVIDER_SITE_OTHER): Payer: BLUE CROSS/BLUE SHIELD | Admitting: Internal Medicine

## 2017-12-13 ENCOUNTER — Encounter: Payer: Self-pay | Admitting: Internal Medicine

## 2017-12-13 DIAGNOSIS — Q8740 Marfan's syndrome, unspecified: Secondary | ICD-10-CM

## 2017-12-13 DIAGNOSIS — I428 Other cardiomyopathies: Secondary | ICD-10-CM

## 2017-12-13 DIAGNOSIS — Z87898 Personal history of other specified conditions: Secondary | ICD-10-CM | POA: Diagnosis not present

## 2017-12-13 DIAGNOSIS — Z9889 Other specified postprocedural states: Secondary | ICD-10-CM

## 2017-12-13 DIAGNOSIS — Z952 Presence of prosthetic heart valve: Secondary | ICD-10-CM | POA: Diagnosis not present

## 2017-12-13 NOTE — Progress Notes (Signed)
ELECTROPHYSIOLOGY CONSULT NOTE  Patient ID: Melissa Hayden, MRN: [id number], DOB/AGE: 14-Oct-1975 42 y.o. Admit date: (Not on file) Date of Consult: 12/13/2017  Primary Physician: Brunetta Jeans, PA-C Primary Cardiologist: JHDenies any     Melissa Hayden is a 42 y.o. female who is being seen today for the evaluation of syncope and ventricular ectopy at the request of Dr. Percival Spanish.    HPI Melissa Hayden is a 42 y.o. female  Referred because of syncope.  She has a long-standing history of syncope.  It has been stereotypically associated with palpitations at its onset, diaphoresis flushing nausea residual orthostatic intolerance with recovery diaphoresis flushing.  Many of these events have occurred upon standing; they have also occurred however while seated as well as lying down.  Most recently she had the onset of symptoms while urinating at night.  She sat on the commode for 10-15 minutes compressing her legs as she had been taught by Dr. Junious Dresser many years ago   [place], on trying to return to her bed, she fell to her knees and had to crawl.  Symptoms abated gradually.  In 2015, in the context of Marfan syndrome and increasing dyspnea and aortic root enlargement and severe MR, she underwent Bentall procedure with a mechanical aortic and mitral valve.   Holter monitor demonstrated nonsustained ventricular tachycardia  >> up to 7 beats And frequent ventricular ectopy.  Ventricular ectopy comprised 23% of her beats.   DATE TEST EF   *2015  echo  30-35%  severe MR  2/19 Echo  30-35%            Past Medical History:  Diagnosis Date  . Anticoagulant long-term use    after valve surgery for Marfan's syndrome  . Anxiety   . Ascending aorta dilatation (HCC)   . Bipolar disorder ([place])   . Depression   . Marfan's syndrome    Dr. Atilano Median  . Migraine   . Mitral valve prolapse   . Palpitations   . Suicidal behavior    Tylenol Overdose  . Varicose veins        Surgical History:  Past Surgical History:  Procedure Laterality Date  . ANKLE SURGERY Left   . BREAST CYST EXCISION    . CARDIAC VALVE SURGERY  09/2014   AV, MV replacement, aortic root replacement (Dr Ysidro Evert at [facility])  . INNER EAR SURGERY Right      Home Meds: Prior to Admission medications   Medication Sig Start Date End Date Taking? Authorizing Provider  carvedilol (COREG) 6.25 MG tablet Take 1 tablet (6.25 mg total) by mouth 2 (two) times daily. 12/09/17 03/09/18 Yes Minus Breeding, MD  cetirizine (ZYRTEC ALLERGY) 10 MG tablet Take 1 tablet (10 mg total) by mouth daily. 10/10/17  Yes Brunetta Jeans, PA-C  citalopram (CELEXA) 20 MG tablet Take 1 tablet (20 mg total) by mouth daily. 10/10/17  Yes Brunetta Jeans, PA-C  Melatonin 10 MG TABS Take 50 mg by mouth daily. 10/10/17  Yes Brunetta Jeans, PA-C  OLANZapine (ZYPREXA) 15 MG tablet Take 1 tablet (15 mg total) by mouth at bedtime. 10/10/17  Yes Brunetta Jeans, PA-C  OVER THE COUNTER MEDICATION Multivitamin/Iron/Folic Acid (Centrum Ultra Women's Oral)-Take 1 tablet by mouth daily.   Yes [provider]  traZODone (DESYREL) 50 MG tablet Take 1 tablet (50 mg total) by mouth at bedtime as needed for sleep. 10/10/17  Yes Brunetta Jeans, PA-C  warfarin (COUMADIN) 5 MG tablet Take 1 tablet (5 mg) as directed on M, W, F. Takes 7.5 mg on other days 09/28/17  Yes Brunetta Jeans, PA-C  warfarin (COUMADIN) 7.5 MG tablet Take 1 tablet (7.5 mg) on T, Th, Sa, Su. Takes 5 mg on other days. 09/28/17  Yes Brunetta Jeans, PA-C    Allergies: No Known Allergies  Social History   Socioeconomic History  . Marital status: Divorced    Spouse name: Not on file  . Number of children: Not on file  . Years of education: 5  . Highest education level: Not on file  Social Needs  . Financial resource strain: Not on file  . Food insecurity - worry: Not on file  . Food insecurity - inability: Not on file  . Transportation needs  - medical: Not on file  . Transportation needs - non-medical: Not on file  Occupational History  . Occupation: [employer]  Tobacco Use  . Smoking status: Current Every Day Smoker    Packs/day: 2.00    Years: 18.00    Pack years: 36.00    Types: Cigarettes  . Smokeless tobacco: Current User  . Tobacco comment: Currently using e-cigarrettes to help her quit smoking   Substance and Sexual Activity  . Alcohol use: No  . Drug use: No  . Sexual activity: Not Currently  Other Topics Concern  . 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  Off and on    Alcohol Use:  None   Drug Use:  None   Diet:  Regular   Exercise:  None   Hobbies:  Reading, Traveling               Family History  Problem Relation Age of Onset  . Cancer Mother 32       Breast   . Marfan syndrome Father   . Heart disease Father   . Marfan syndrome Sister   . Marfan syndrome Paternal Grandmother   . Marfan syndrome Sister      ROS:  Please see the history of present illness.     All other systems reviewed and negative.    Physical Exam: Blood pressure 118/70, pulse 68, height 6\' 2"  (1.88 m), weight 185 lb (83.9 kg), SpO2 98 %. General: Well developed, well nourished female in no acute distress. Head: Normocephalic, atraumatic, sclera non-icteric, no xanthomas, nares are without discharge. EENT: normal  Lymph Nodes:  none Neck: Negative for carotid bruits. JVD not elevated. Back:without scoliosis kyphosis Lungs: Clear bilaterally to auscultation without wheezes, rales, or rhonchi. Breathing is unlabored. Heart: RRR with mechanical E9-H3 and a 2/6 systolic murmur . No rubs, or gallops appreciated. Abdomen: Soft, non-tender, non-distended with normoactive bowel sounds. No hepatomegaly. No rebound/guarding. No obvious abdominal masses. Msk:   Strength and tone appear normal for age. Extremities: No clubbing or cyanosis. No  edema.  Distal pedal pulses are 2+ and equal bilaterally. Skin: Warm and Dry Neuro: Alert and oriented X 3. CN III-XII intact Grossly normal sensory and motor function . Psych:  Responds to questions appropriately with a normal affect.      Labs: Cardiac Enzymes No results for input(s): CKTOTAL, CKMB, TROPONINI in the last 72 hours. CBC Lab Results  Component  Value Date   WBC 7.7 10/31/2017   HGB 14.4 10/31/2017   HCT 44.0 10/31/2017   MCV 88.4 10/31/2017   PLT 244.0 10/31/2017   PROTIME: No results for input(s): LABPROT, INR in the last 72 hours. Chemistry No results for input(s): NA, K, CL, CO2, BUN, CREATININE, CALCIUM, PROT, BILITOT, ALKPHOS, ALT, AST, GLUCOSE in the last 168 hours.  Invalid input(s): LABALBU Lipids Lab Results  Component Value Date   CHOL 244 (H) 10/31/2017   HDL 44.70 10/31/2017   LDLCALC 167 (H) 10/31/2017   TRIG 160.0 (H) 10/31/2017   BNP No results found for: PROBNP Thyroid Function Tests: No results for input(s): TSH, T4TOTAL, T3FREE, THYROIDAB in the last 72 hours.  Invalid input(s): FREET3 Miscellaneous No results found for: DDIMER  Radiology/Studies:  No results found.  EKG: Sinus rhythm at 76 Interval 17/09/41 Frequent ventricular ectopy with a superior axis right bundle branch block morphology all all all right right right so amiodarone is not using daily drug so we will put him back to daily and was distant so they are all in the same category you go up and toxicity sinus rhythm at 60 nasal interval 26/09/47 persistent atrial fibrillation   Assessment and Plan:  Nonischemic cardiomyopathy  Complex ventricular ectopy with couplets frequent PVCs.  Pattern of bigeminy--quadrigeminy  Marfan syndrome status post aortic root replacement and aortic valve replacement  Syncope  Smoking; trying to quit   The patient has complex ventricular ectopy in  the context of her persistent cardiomyopathy.  It is not clear whether there is any contribution to her cardiomyopathy from her ventricular ectopy, but given its symptomatic nature and its burden i.e. 23%, it is important that we reduce  Burden.  Given her cardiomyopathy, I would begin first with medications directed at it; there are good data for the value of Entresto.  I will review this with Dr. Percival Spanish.  Given her syncope and its association with palpitations, it is reasonable to presume that there is arrhythmic contribution.  Syncope in the context of nonischemic cardia myopathy is a high risk marker and ICD implantation is recommended for both primary prevention as well as possibly secondary prevention.  Still, her syncope history is since long duration, and is associate with a prodrome suggestive of a neurally mediated reflex, that it could be ventricular ectopy triggering these reflexes.  In any case, an ICD would be appropriate for primary prevention.  We will also directed therapy and try to get rid of the ventricular ectopy as a possible trigger.  She is reminded that driving in [place] is precluded for 6 months in the context of cardiomyopathy and complex ventricular ectopy.  We will undertake her ICD implantation without interruption of anticoagulation.  Have reviewed the potential benefits and risks of ICD implantation including but not limited to death, perforation of heart or lung, lead dislodgement, infection,  device malfunction and inappropriate shocks.  The patient express understanding  and are willing to proceed.      Virl Axe

## 2017-12-13 NOTE — Patient Instructions (Addendum)
Medication Instructions:  Your physician recommends that you continue on your current medications as directed. Please refer to the Current Medication list given to you today.  Labwork: You will need to get labs drawn on March 25th-- CBC and BMP  Testing/Procedures:  Your physician has recommended that you have a defibrillator inserted. An implantable cardioverter defibrillator (ICD) is a small device that is placed in your chest or, in rare cases, your abdomen. This device uses electrical pulses or shocks to help control life-threatening, irregular heartbeats that could lead the heart to suddenly stop beating (sudden cardiac arrest). Leads are attached to the ICD that goes into your heart. This is done in the hospital and usually requires an overnight stay. Please see the instruction sheet given to you today for more information.  Please arrive at the [facility] main entrance of [place] hospital at:    March 27 at 6:30am  Do not eat or drink after midnight prior to procedure Take all your medications as normal the morning of your procedure Plan for one night stay You will need someone to drive you home at discharge   Follow-Up: Your physician recommends that you schedule a follow-up appointment in:   10-14 days with the device clinic for a wound check 91 days with Dr Caryl Comes  Follow instructions for the surgical scrub the night before and morning of your procedure.   Any Other Special Instructions Will Be Listed Below (If Applicable).     If you need a refill on your cardiac medications before your next appointment, please call your pharmacy.

## 2017-12-14 ENCOUNTER — Telehealth: Payer: Self-pay | Admitting: Internal Medicine

## 2017-12-14 NOTE — Telephone Encounter (Signed)
New message    Patient wants to reschedule 3/27 procedure date. Please call

## 2017-12-15 NOTE — Telephone Encounter (Signed)
Late entry:  Spoke with patient on 3/6 and she requested a change in her procedure date. I told her I'd return her call on 3/7 to talk about potential days available.   I have left a message for her to return my call; Dr Caryl Comes has opened up a lab day on March 18th that may work for her. Awaiting return of call.

## 2017-12-15 NOTE — Telephone Encounter (Signed)
Rescheduled with Ms Lavalle for March 18th. She will arrive at patient registration at 9:30 am for a 12:00 case. Pt verbalized pre procedure instructions. Lab date was also rescheduled.

## 2017-12-19 ENCOUNTER — Other Ambulatory Visit: Payer: Self-pay | Admitting: Emergency Medicine

## 2017-12-19 DIAGNOSIS — Z7901 Long term (current) use of anticoagulants: Secondary | ICD-10-CM

## 2017-12-19 MED ORDER — WARFARIN SODIUM 7.5 MG PO TABS: ORAL_TABLET | ORAL | 1 refills | Status: DC

## 2017-12-19 MED ORDER — WARFARIN SODIUM 5 MG PO TABS
ORAL_TABLET | ORAL | 1 refills | Status: DC
Start: 2017-12-19 — End: 2018-05-30

## 2017-12-21 ENCOUNTER — Other Ambulatory Visit: Payer: BLUE CROSS/BLUE SHIELD

## 2017-12-21 DIAGNOSIS — Q8740 Marfan's syndrome, unspecified: Secondary | ICD-10-CM | POA: Diagnosis not present

## 2017-12-21 DIAGNOSIS — Z952 Presence of prosthetic heart valve: Secondary | ICD-10-CM

## 2017-12-21 DIAGNOSIS — I428 Other cardiomyopathies: Secondary | ICD-10-CM | POA: Diagnosis not present

## 2017-12-21 DIAGNOSIS — Z87898 Personal history of other specified conditions: Secondary | ICD-10-CM | POA: Diagnosis not present

## 2017-12-21 DIAGNOSIS — Z9889 Other specified postprocedural states: Secondary | ICD-10-CM

## 2017-12-22 LAB — CBC WITH DIFFERENTIAL/PLATELET
Basophils Absolute: 0 10*3/uL (ref 0.0–0.2)
Basos: 0 %
EOS (ABSOLUTE): 0.2 10*3/uL (ref 0.0–0.4)
EOS: 2 %
HEMATOCRIT: 41 % (ref 34.0–46.6)
Hemoglobin: 13.4 g/dL (ref 11.1–15.9)
IMMATURE GRANS (ABS): 0 10*3/uL (ref 0.0–0.1)
IMMATURE GRANULOCYTES: 0 %
LYMPHS: 35 %
Lymphocytes Absolute: 2.4 10*3/uL (ref 0.7–3.1)
MCH: 28.7 pg (ref 26.6–33.0)
MCHC: 32.7 g/dL (ref 31.5–35.7)
MCV: 88 fL (ref 79–97)
MONOS ABS: 0.5 10*3/uL (ref 0.1–0.9)
Monocytes: 7 %
Neutrophils Absolute: 3.9 10*3/uL (ref 1.4–7.0)
Neutrophils: 56 %
Platelets: 245 10*3/uL (ref 150–379)
RBC: 4.67 x10E6/uL (ref 3.77–5.28)
RDW: 15.3 % (ref 12.3–15.4)
WBC: 7 10*3/uL (ref 3.4–10.8)

## 2017-12-22 LAB — BASIC METABOLIC PANEL
BUN/Creatinine Ratio: 12 (ref 9–23)
BUN: 9 mg/dL (ref 6–24)
CALCIUM: 9.1 mg/dL (ref 8.7–10.2)
CO2: 22 mmol/L (ref 20–29)
Chloride: 106 mmol/L (ref 96–106)
Creatinine, Ser: 0.73 mg/dL (ref 0.57–1.00)
GFR calc Af Amer: 118 mL/min/{1.73_m2} (ref >59)
GFR, EST NON AFRICAN AMERICAN: 103 mL/min/{1.73_m2} (ref >59)
Glucose: 81 mg/dL (ref 65–99)
POTASSIUM: 4 mmol/L (ref 3.5–5.2)
Sodium: 142 mmol/L (ref 134–144)

## 2017-12-26 ENCOUNTER — Encounter (HOSPITAL_COMMUNITY)
Admission: RE | Disposition: A | Discharge status: Home / Self Care | Payer: Self-pay | Source: Ambulatory Visit | Attending: Internal Medicine

## 2017-12-26 ENCOUNTER — Ambulatory Visit (HOSPITAL_COMMUNITY)
Admission: RE | Admit: 2017-12-26 | Discharge: 2017-12-26 | Disposition: A | Discharge status: Home / Self Care | Payer: BLUE CROSS/BLUE SHIELD | Source: Ambulatory Visit | Attending: Internal Medicine | Admitting: Internal Medicine

## 2017-12-26 ENCOUNTER — Encounter: Payer: Self-pay | Admitting: Internal Medicine

## 2017-12-26 DIAGNOSIS — Z79899 Other long term (current) drug therapy: Secondary | ICD-10-CM | POA: Diagnosis not present

## 2017-12-26 DIAGNOSIS — I428 Other cardiomyopathies: Secondary | ICD-10-CM | POA: Diagnosis not present

## 2017-12-26 DIAGNOSIS — Z538 Procedure and treatment not carried out for other reasons: Secondary | ICD-10-CM | POA: Insufficient documentation

## 2017-12-26 DIAGNOSIS — Q8740 Marfan's syndrome, unspecified: Secondary | ICD-10-CM | POA: Insufficient documentation

## 2017-12-26 DIAGNOSIS — F419 Anxiety disorder, unspecified: Secondary | ICD-10-CM | POA: Diagnosis not present

## 2017-12-26 DIAGNOSIS — Z952 Presence of prosthetic heart valve: Secondary | ICD-10-CM | POA: Insufficient documentation

## 2017-12-26 DIAGNOSIS — F319 Bipolar disorder, unspecified: Secondary | ICD-10-CM | POA: Diagnosis not present

## 2017-12-26 DIAGNOSIS — Z7901 Long term (current) use of anticoagulants: Secondary | ICD-10-CM | POA: Diagnosis not present

## 2017-12-26 DIAGNOSIS — R791 Abnormal coagulation profile: Secondary | ICD-10-CM | POA: Insufficient documentation

## 2017-12-26 DIAGNOSIS — Z87898 Personal history of other specified conditions: Secondary | ICD-10-CM

## 2017-12-26 DIAGNOSIS — R55 Syncope and collapse: Secondary | ICD-10-CM | POA: Diagnosis not present

## 2017-12-26 DIAGNOSIS — I493 Ventricular premature depolarization: Secondary | ICD-10-CM | POA: Diagnosis not present

## 2017-12-26 DIAGNOSIS — F17210 Nicotine dependence, cigarettes, uncomplicated: Secondary | ICD-10-CM | POA: Insufficient documentation

## 2017-12-26 DIAGNOSIS — I429 Cardiomyopathy, unspecified: Secondary | ICD-10-CM

## 2017-12-26 LAB — SURGICAL PCR SCREEN
MRSA, PCR: NEGATIVE
Staphylococcus aureus: NEGATIVE

## 2017-12-26 LAB — PROTIME-INR
INR: 4.16
Prothrombin Time: 39.9 seconds — ABNORMAL HIGH (ref 11.4–15.2)

## 2017-12-26 LAB — PREGNANCY, URINE: Preg Test, Ur: NEGATIVE

## 2017-12-26 SURGERY — ICD IMPLANT
Anesthesia: LOCAL

## 2017-12-26 MED ORDER — CEFAZOLIN SODIUM-DEXTROSE 2-4 GM/100ML-% IV SOLN
INTRAVENOUS | Status: AC
  Filled 2017-12-26: qty 100

## 2017-12-26 MED ORDER — SODIUM CHLORIDE 0.9 % IR SOLN: 80.0000 mg | Status: DC

## 2017-12-26 MED ORDER — LIDOCAINE HCL (PF) 1 % IJ SOLN
INTRAMUSCULAR | Status: AC
  Filled 2017-12-26: qty 30

## 2017-12-26 MED ORDER — MUPIROCIN 2 % EX OINT
TOPICAL_OINTMENT | CUTANEOUS | Status: AC
  Administered 2017-12-26: 1 via TOPICAL
  Filled 2017-12-26: qty 22

## 2017-12-26 MED ORDER — CEFAZOLIN SODIUM-DEXTROSE 2-4 GM/100ML-% IV SOLN: 2.0000 g | INTRAVENOUS | Status: DC

## 2017-12-26 MED ORDER — SODIUM CHLORIDE 0.9 % IR SOLN
Status: AC
  Filled 2017-12-26: qty 2

## 2017-12-26 MED ORDER — SODIUM CHLORIDE 0.9 % IV SOLN
INTRAVENOUS | Status: DC
  Administered 2017-12-26: 11:00:00 via INTRAVENOUS

## 2017-12-26 MED ORDER — HEPARIN (PORCINE) IN NACL 2-0.9 UNIT/ML-% IJ SOLN
INTRAMUSCULAR | Status: AC
  Filled 2017-12-26: qty 500

## 2017-12-26 MED ORDER — CHLORHEXIDINE GLUCONATE 4 % EX LIQD: 60.0000 mL | Freq: Once | CUTANEOUS | Status: DC

## 2017-12-26 MED ORDER — MUPIROCIN 2 % EX OINT
1.0000 "application " | TOPICAL_OINTMENT | Freq: Once | CUTANEOUS | Status: AC
  Administered 2017-12-26: 1 via TOPICAL
  Filled 2017-12-26: qty 22

## 2017-12-26 NOTE — Progress Notes (Signed)
**Note Amadi Frady-Identified via Obfuscation** CRITICAL VALUE STICKER  CRITICAL VALUE: INR 4.16  RECEIVER: Wilfrid Lund, RN   DATE & TIME NOTIFIED: 12/26/2017 at [place], RN notified of critical value   TIME OF NOTIFICATION: 1205  RESPONSE: Hold off on bringing patient to cardiac cath lab.

## 2017-12-26 NOTE — H&P (Signed)
Pt seen  INR is > 4  Will send home and reschedule  She would like to be done next week--will see about colleague to do at the end of the week so as tom minimize her work absence  Will reduce her coumadin 5 MWF 7.5 TTSS >>>  7.5 MF 5 TWTSS  Will need INR next Monday

## 2017-12-27 ENCOUNTER — Telehealth: Payer: Self-pay | Admitting: Internal Medicine

## 2017-12-27 DIAGNOSIS — Z538 Procedure and treatment not carried out for other reasons: Secondary | ICD-10-CM | POA: Diagnosis not present

## 2017-12-27 DIAGNOSIS — Z01812 Encounter for preprocedural laboratory examination: Secondary | ICD-10-CM

## 2017-12-27 DIAGNOSIS — F319 Bipolar disorder, unspecified: Secondary | ICD-10-CM | POA: Diagnosis not present

## 2017-12-27 DIAGNOSIS — Z7901 Long term (current) use of anticoagulants: Secondary | ICD-10-CM | POA: Diagnosis not present

## 2017-12-27 DIAGNOSIS — Z79899 Other long term (current) drug therapy: Secondary | ICD-10-CM | POA: Diagnosis not present

## 2017-12-27 DIAGNOSIS — F419 Anxiety disorder, unspecified: Secondary | ICD-10-CM | POA: Diagnosis not present

## 2017-12-27 DIAGNOSIS — R791 Abnormal coagulation profile: Secondary | ICD-10-CM | POA: Diagnosis not present

## 2017-12-27 DIAGNOSIS — F17210 Nicotine dependence, cigarettes, uncomplicated: Secondary | ICD-10-CM | POA: Diagnosis not present

## 2017-12-27 DIAGNOSIS — I428 Other cardiomyopathies: Secondary | ICD-10-CM | POA: Diagnosis not present

## 2017-12-27 DIAGNOSIS — I493 Ventricular premature depolarization: Secondary | ICD-10-CM | POA: Diagnosis not present

## 2017-12-27 DIAGNOSIS — R55 Syncope and collapse: Secondary | ICD-10-CM | POA: Diagnosis not present

## 2017-12-27 DIAGNOSIS — Q8740 Marfan's syndrome, unspecified: Secondary | ICD-10-CM | POA: Diagnosis not present

## 2017-12-27 DIAGNOSIS — Z952 Presence of prosthetic heart valve: Secondary | ICD-10-CM | POA: Diagnosis not present

## 2017-12-27 NOTE — Telephone Encounter (Signed)
Patient calling back for update on rescheduling procedure.

## 2017-12-27 NOTE — Telephone Encounter (Signed)
Dr Lovena Le has agreed to add pt on to his schedule for a single chamber ICD for 3/25. Pt will be at patient registration by 9:30am for an 11:30am case. Pt understands NPO after midnight.  INR will be drawn on Monday 3/25. Dr Lovena Le will write orders for coumadin and/or lovenox based off her INR level. Pt understands she will hear from Dr Tanna Furry nurse on Monday with her orders.

## 2017-12-27 NOTE — Addendum Note (Signed)
Addended by: Dollene Primrose on: 12/27/2017 04:09 PM   Modules accepted: Orders

## 2017-12-30 ENCOUNTER — Other Ambulatory Visit: Payer: Self-pay | Admitting: Physician Assistant

## 2017-12-30 DIAGNOSIS — F3162 Bipolar disorder, current episode mixed, moderate: Secondary | ICD-10-CM

## 2017-12-30 MED ORDER — OLANZAPINE 15 MG PO TABS: 15.0000 mg | ORAL_TABLET | Freq: Every day | ORAL | 0 refills | Status: DC

## 2018-01-02 ENCOUNTER — Other Ambulatory Visit: Payer: Self-pay | Admitting: Emergency Medicine

## 2018-01-02 ENCOUNTER — Telehealth: Payer: Self-pay

## 2018-01-02 ENCOUNTER — Other Ambulatory Visit: Payer: BLUE CROSS/BLUE SHIELD | Admitting: *Deleted

## 2018-01-02 ENCOUNTER — Other Ambulatory Visit: Payer: Self-pay

## 2018-01-02 DIAGNOSIS — Z01812 Encounter for preprocedural laboratory examination: Secondary | ICD-10-CM

## 2018-01-02 DIAGNOSIS — F3162 Bipolar disorder, current episode mixed, moderate: Secondary | ICD-10-CM

## 2018-01-02 LAB — PROTIME-INR
INR: 3.6 — ABNORMAL HIGH (ref 0.8–1.2)
PROTHROMBIN TIME: 37.9 s — AB (ref 9.1–12.0)

## 2018-01-02 MED ORDER — OLANZAPINE 15 MG PO TABS: 15.0000 mg | ORAL_TABLET | Freq: Every day | ORAL | 1 refills | Status: DC

## 2018-01-02 NOTE — Telephone Encounter (Signed)
Left CVM per DPR with warfarin instructions pre gen change on 01/05/2018 01/02/2018-take 2.5 mg 01/03/2018- take 2.5 mg 01/04/2018- take 5 mg Further instructions will be received after gen change on 01/05/2018 Will also send via MyChart.

## 2018-01-03 ENCOUNTER — Telehealth: Payer: Self-pay | Admitting: Internal Medicine

## 2018-01-03 NOTE — Telephone Encounter (Signed)
Returned call to Pt.  Notified that most gen changes are discharged same day, however Pt has unique comorbidities.  Notified Pt to be prepared to stay the night, but she may be discharged after the procedure.  Pt indicates understanding.

## 2018-01-03 NOTE — Telephone Encounter (Signed)
New Message:    Pt is wanting to know if she will be staying over night after her procedure on 3/28 @ 11:30.

## 2018-01-05 ENCOUNTER — Encounter (HOSPITAL_COMMUNITY)
Admission: RE | Disposition: A | Discharge status: Home / Self Care | Payer: Self-pay | Source: Ambulatory Visit | Attending: Internal Medicine

## 2018-01-05 ENCOUNTER — Other Ambulatory Visit: Payer: Self-pay

## 2018-01-05 ENCOUNTER — Ambulatory Visit (HOSPITAL_COMMUNITY)
Admission: RE | Admit: 2018-01-05 | Discharge: 2018-01-06 | Disposition: A | Discharge status: Home / Self Care | Payer: BLUE CROSS/BLUE SHIELD | Source: Ambulatory Visit | Attending: Internal Medicine | Admitting: Internal Medicine

## 2018-01-05 DIAGNOSIS — R55 Syncope and collapse: Secondary | ICD-10-CM | POA: Diagnosis not present

## 2018-01-05 DIAGNOSIS — F319 Bipolar disorder, unspecified: Secondary | ICD-10-CM | POA: Insufficient documentation

## 2018-01-05 DIAGNOSIS — Z7901 Long term (current) use of anticoagulants: Secondary | ICD-10-CM | POA: Diagnosis not present

## 2018-01-05 DIAGNOSIS — F17210 Nicotine dependence, cigarettes, uncomplicated: Secondary | ICD-10-CM | POA: Insufficient documentation

## 2018-01-05 DIAGNOSIS — Z952 Presence of prosthetic heart valve: Secondary | ICD-10-CM | POA: Diagnosis not present

## 2018-01-05 DIAGNOSIS — I472 Ventricular tachycardia: Secondary | ICD-10-CM | POA: Diagnosis not present

## 2018-01-05 DIAGNOSIS — Z006 Encounter for examination for normal comparison and control in clinical research program: Secondary | ICD-10-CM | POA: Insufficient documentation

## 2018-01-05 DIAGNOSIS — F329 Major depressive disorder, single episode, unspecified: Secondary | ICD-10-CM | POA: Insufficient documentation

## 2018-01-05 DIAGNOSIS — I428 Other cardiomyopathies: Secondary | ICD-10-CM | POA: Diagnosis not present

## 2018-01-05 DIAGNOSIS — Z79899 Other long term (current) drug therapy: Secondary | ICD-10-CM | POA: Diagnosis not present

## 2018-01-05 DIAGNOSIS — I5022 Chronic systolic (congestive) heart failure: Secondary | ICD-10-CM | POA: Diagnosis present

## 2018-01-05 DIAGNOSIS — Z95810 Presence of automatic (implantable) cardiac defibrillator: Secondary | ICD-10-CM

## 2018-01-05 DIAGNOSIS — I509 Heart failure, unspecified: Secondary | ICD-10-CM | POA: Diagnosis not present

## 2018-01-05 HISTORY — PX: ICD IMPLANT: EP1208

## 2018-01-05 LAB — CBC WITH DIFFERENTIAL/PLATELET
BASOS ABS: 0 10*3/uL (ref 0.0–0.2)
Basos: 0 %
EOS (ABSOLUTE): 0.2 10*3/uL (ref 0.0–0.4)
Eos: 2 %
HEMATOCRIT: 41.7 % (ref 34.0–46.6)
HEMOGLOBIN: 13.6 g/dL (ref 11.1–15.9)
Immature Grans (Abs): 0 10*3/uL (ref 0.0–0.1)
Immature Granulocytes: 0 %
LYMPHS ABS: 2.2 10*3/uL (ref 0.7–3.1)
Lymphs: 33 %
MCH: 28.6 pg (ref 26.6–33.0)
MCHC: 32.6 g/dL (ref 31.5–35.7)
MCV: 88 fL (ref 79–97)
MONOS ABS: 0.5 10*3/uL (ref 0.1–0.9)
Monocytes: 8 %
NEUTROS ABS: 3.6 10*3/uL (ref 1.4–7.0)
Neutrophils: 57 %
Platelets: 251 10*3/uL (ref 150–379)
RBC: 4.76 x10E6/uL (ref 3.77–5.28)
RDW: 15.2 % (ref 12.3–15.4)
WBC: 6.5 10*3/uL (ref 3.4–10.8)

## 2018-01-05 LAB — BASIC METABOLIC PANEL
BUN/Creatinine Ratio: 10 (ref 9–23)
BUN: 8 mg/dL (ref 6–24)
CALCIUM: 9.1 mg/dL (ref 8.7–10.2)
CO2: 25 mmol/L (ref 20–29)
CREATININE: 0.78 mg/dL (ref 0.57–1.00)
Chloride: 103 mmol/L (ref 96–106)
GFR, EST AFRICAN AMERICAN: 109 mL/min/{1.73_m2} (ref >59)
GFR, EST NON AFRICAN AMERICAN: 95 mL/min/{1.73_m2} (ref >59)
Glucose: 98 mg/dL (ref 65–99)
Potassium: 3.9 mmol/L (ref 3.5–5.2)
Sodium: 140 mmol/L (ref 134–144)

## 2018-01-05 LAB — PROTIME-INR
INR: 2.23
PROTHROMBIN TIME: 24.5 s — AB (ref 11.4–15.2)

## 2018-01-05 LAB — SURGICAL PCR SCREEN
MRSA, PCR: NEGATIVE
STAPHYLOCOCCUS AUREUS: NEGATIVE

## 2018-01-05 SURGERY — ICD IMPLANT
Anesthesia: LOCAL

## 2018-01-05 MED ORDER — ADULT MULTIVITAMIN W/MINERALS CH
1.0000 | ORAL_TABLET | Freq: Every day | ORAL | Status: DC
  Administered 2018-01-05 – 2018-01-06 (×2): 1 via ORAL
  Filled 2018-01-05 (×2): qty 1

## 2018-01-05 MED ORDER — MIDAZOLAM HCL 5 MG/5ML IJ SOLN
INTRAMUSCULAR | Status: AC
  Filled 2018-01-05: qty 5

## 2018-01-05 MED ORDER — MELATONIN 10 MG PO TABS: 50.0000 mg | ORAL_TABLET | Freq: Every day | ORAL | Status: DC

## 2018-01-05 MED ORDER — MELATONIN 3 MG PO TABS
15.0000 mg | ORAL_TABLET | Freq: Every day | ORAL | Status: DC
  Administered 2018-01-05: 15 mg via ORAL
  Filled 2018-01-05: qty 5

## 2018-01-05 MED ORDER — HEPARIN (PORCINE) IN NACL 2-0.9 UNIT/ML-% IJ SOLN
INTRAMUSCULAR | Status: AC | PRN
  Administered 2018-01-05: 500 mL

## 2018-01-05 MED ORDER — MUPIROCIN 2 % EX OINT
TOPICAL_OINTMENT | CUTANEOUS | Status: AC
  Administered 2018-01-05: 1
  Filled 2018-01-05: qty 22

## 2018-01-05 MED ORDER — OLANZAPINE 7.5 MG PO TABS
15.0000 mg | ORAL_TABLET | Freq: Every day | ORAL | Status: DC
  Administered 2018-01-05: 15 mg via ORAL
  Filled 2018-01-05: qty 2

## 2018-01-05 MED ORDER — LIDOCAINE HCL (PF) 1 % IJ SOLN
INTRAMUSCULAR | Status: AC
  Filled 2018-01-05: qty 30

## 2018-01-05 MED ORDER — SODIUM CHLORIDE 0.9 % IR SOLN
80.0000 mg | Status: AC
  Administered 2018-01-05: 80 mg

## 2018-01-05 MED ORDER — CEFAZOLIN SODIUM-DEXTROSE 1-4 GM/50ML-% IV SOLN
1.0000 g | Freq: Four times a day (QID) | INTRAVENOUS | Status: AC
  Administered 2018-01-05 – 2018-01-06 (×3): 1 g via INTRAVENOUS
  Filled 2018-01-05 (×3): qty 50

## 2018-01-05 MED ORDER — FENTANYL CITRATE (PF) 100 MCG/2ML IJ SOLN
INTRAMUSCULAR | Status: AC
  Filled 2018-01-05: qty 2

## 2018-01-05 MED ORDER — CEFAZOLIN SODIUM-DEXTROSE 2-4 GM/100ML-% IV SOLN
2.0000 g | INTRAVENOUS | Status: AC
  Administered 2018-01-05: 2 g via INTRAVENOUS

## 2018-01-05 MED ORDER — WARFARIN - PHYSICIAN DOSING INPATIENT: Freq: Every day | Status: DC

## 2018-01-05 MED ORDER — HEPARIN (PORCINE) IN NACL 2-0.9 UNIT/ML-% IJ SOLN
INTRAMUSCULAR | Status: AC
  Filled 2018-01-05: qty 500

## 2018-01-05 MED ORDER — CEFAZOLIN SODIUM-DEXTROSE 2-4 GM/100ML-% IV SOLN
INTRAVENOUS | Status: AC
  Filled 2018-01-05: qty 100

## 2018-01-05 MED ORDER — TRAZODONE HCL 50 MG PO TABS
50.0000 mg | ORAL_TABLET | Freq: Every evening | ORAL | Status: DC | PRN
Start: 2018-01-05 — End: 2018-01-06
  Administered 2018-01-05: 50 mg via ORAL
  Filled 2018-01-05: qty 1

## 2018-01-05 MED ORDER — SODIUM CHLORIDE 0.9 % IR SOLN
Status: AC
  Filled 2018-01-05: qty 2

## 2018-01-05 MED ORDER — ACETAMINOPHEN 325 MG PO TABS
325.0000 mg | ORAL_TABLET | ORAL | Status: DC | PRN
  Administered 2018-01-05 – 2018-01-06 (×3): 650 mg via ORAL
  Filled 2018-01-05 (×3): qty 2

## 2018-01-05 MED ORDER — CARVEDILOL 6.25 MG PO TABS
6.2500 mg | ORAL_TABLET | Freq: Two times a day (BID) | ORAL | Status: DC
  Administered 2018-01-05 – 2018-01-06 (×2): 6.25 mg via ORAL
  Filled 2018-01-05 (×2): qty 1

## 2018-01-05 MED ORDER — MIDAZOLAM HCL 5 MG/5ML IJ SOLN
INTRAMUSCULAR | Status: DC | PRN
  Administered 2018-01-05: 2 mg via INTRAVENOUS
  Administered 2018-01-05: 1 mg via INTRAVENOUS
  Administered 2018-01-05: 2 mg via INTRAVENOUS
  Administered 2018-01-05 (×6): 1 mg via INTRAVENOUS

## 2018-01-05 MED ORDER — ONDANSETRON HCL 4 MG/2ML IJ SOLN: 4.0000 mg | Freq: Four times a day (QID) | INTRAMUSCULAR | Status: DC | PRN

## 2018-01-05 MED ORDER — CHLORHEXIDINE GLUCONATE 4 % EX LIQD: 60.0000 mL | Freq: Once | CUTANEOUS | Status: DC

## 2018-01-05 MED ORDER — SODIUM CHLORIDE 0.9 % IV SOLN
INTRAVENOUS | Status: DC
  Administered 2018-01-05: 10:00:00 via INTRAVENOUS

## 2018-01-05 MED ORDER — WARFARIN SODIUM 5 MG PO TABS: 5.0000 mg | ORAL_TABLET | ORAL | Status: DC

## 2018-01-05 MED ORDER — LIDOCAINE HCL (PF) 1 % IJ SOLN
INTRAMUSCULAR | Status: DC | PRN
  Administered 2018-01-05: 60 mL

## 2018-01-05 MED ORDER — FENTANYL CITRATE (PF) 100 MCG/2ML IJ SOLN
INTRAMUSCULAR | Status: DC | PRN
  Administered 2018-01-05 (×6): 12.5 ug via INTRAVENOUS
  Administered 2018-01-05: 25 ug via INTRAVENOUS

## 2018-01-05 MED ORDER — WARFARIN SODIUM 7.5 MG PO TABS
7.5000 mg | ORAL_TABLET | ORAL | Status: DC
  Administered 2018-01-05: 7.5 mg via ORAL
  Filled 2018-01-05: qty 1

## 2018-01-05 SURGICAL SUPPLY — 6 items
CABLE SURGICAL S-101-97-12 (CABLE) ×2 IMPLANT
ICD VISIA MRI DVFB1D1 (ICD Generator) ×2 IMPLANT
LEAD SPRINT QUAT SEC 6935-65CM (Lead) ×2 IMPLANT
PAD DEFIB LIFELINK (PAD) ×2 IMPLANT
SHEATH CLASSIC 9F (SHEATH) ×2 IMPLANT
TRAY PACEMAKER INSERTION (PACKS) ×2 IMPLANT

## 2018-01-05 NOTE — H&P (Signed)
ELECTROPHYSIOLOGY CONSULT NOTE  Patient ID: Melissa Hayden, MRN: [id number], DOB/AGE: [birth year] 42 y.o. Admit date: (Not on file) Date of Consult: 12/13/2017  Primary Physician: Brunetta Jeans, PA-C Primary Cardiologist: JHDenies any     Melissa Hayden is a 42 y.o. female who is being seen today for the evaluation of syncope and ventricular ectopy at the request of Dr. Percival Spanish.    HPI Melissa Hayden is a 42 y.o. female  Referred because of syncope.  She has a long-standing history of syncope.  It has been stereotypically associated with palpitations at its onset, diaphoresis flushing nausea residual orthostatic intolerance with recovery diaphoresis flushing.  Many of these events have occurred upon standing; they have also occurred however while seated as well as lying down.  Most recently she had the onset of symptoms while urinating at night.  She sat on the commode for 10-15 minutes compressing her legs as she had been taught by Dr. Junious Dresser many years ago   [place], on trying to return to her bed, she fell to her knees and had to crawl.  Symptoms abated gradually.  In 2015, in the context of Marfan syndrome and increasing dyspnea and aortic root enlargement and severe MR, she underwent Bentall procedure with a mechanical aortic and mitral valve.   Holter monitor demonstrated nonsustained ventricular tachycardia  >> up to 7 beats And frequent ventricular ectopy.  Ventricular ectopy comprised 23% of her beats.   DATE TEST EF   *2015  echo  30-35%  severe MR  2/19 Echo  30-35%                Past Medical History:  Diagnosis Date  . Anticoagulant long-term use    after valve surgery for Marfan's syndrome  . Anxiety   . Ascending aorta dilatation (HCC)   . Bipolar disorder ([place])   . Depression   . Marfan's syndrome    Dr. Atilano Median  . Migraine   . Mitral valve prolapse   . Palpitations   . Suicidal behavior    Tylenol  Overdose  . Varicose veins       Surgical History:       Past Surgical History:  Procedure Laterality Date  . ANKLE SURGERY Left   . BREAST CYST EXCISION    . CARDIAC VALVE SURGERY  09/2014   AV, MV replacement, aortic root replacement (Dr Ysidro Evert at [facility])  . INNER EAR SURGERY Right      Home Meds:        Prior to Admission medications   Medication Sig Start Date End Date Taking? Authorizing Provider  carvedilol (COREG) 6.25 MG tablet Take 1 tablet (6.25 mg total) by mouth 2 (two) times daily. 12/09/17 03/09/18 Yes Minus Breeding, MD  cetirizine (ZYRTEC ALLERGY) 10 MG tablet Take 1 tablet (10 mg total) by mouth daily. 10/10/17  Yes Brunetta Jeans, PA-C  citalopram (CELEXA) 20 MG tablet Take 1 tablet (20 mg total) by mouth daily. 10/10/17  Yes Brunetta Jeans, PA-C  Melatonin 10 MG TABS Take 50 mg by mouth daily. 10/10/17  Yes Brunetta Jeans, PA-C  OLANZapine (ZYPREXA) 15 MG tablet Take 1 tablet (15 mg total) by mouth at bedtime. 10/10/17  Yes Brunetta Jeans, PA-C  OVER THE COUNTER MEDICATION Multivitamin/Iron/Folic Acid (Centrum Ultra Women's Oral)-Take 1 tablet by mouth daily.   Yes [provider]  traZODone (DESYREL) 50 MG tablet Take 1 tablet (50 mg total)  by mouth at bedtime as needed for sleep. 10/10/17  Yes Brunetta Jeans, PA-C  warfarin (COUMADIN) 5 MG tablet Take 1 tablet (5 mg) as directed on M, W, F. Takes 7.5 mg on other days 09/28/17  Yes Brunetta Jeans, PA-C  warfarin (COUMADIN) 7.5 MG tablet Take 1 tablet (7.5 mg) on T, Th, Sa, Su. Takes 5 mg on other days. 09/28/17  Yes Brunetta Jeans, PA-C    Allergies: No Known Allergies  Social History        Socioeconomic History  . Marital status: Divorced    Spouse name: Not on file  . Number of children: Not on file  . Years of education: 35  . Highest education level: Not on file  Social Needs  . Financial resource strain: Not on file  . Food insecurity - worry: Not  on file  . Food insecurity - inability: Not on file  . Transportation needs - medical: Not on file  . Transportation needs - non-medical: Not on file  Occupational History  . Occupation: [employer]  Tobacco Use  . Smoking status: Current Every Day Smoker    Packs/day: 2.00    Years: 18.00    Pack years: 36.00    Types: Cigarettes  . Smokeless tobacco: Current User  . Tobacco comment: Currently using e-cigarrettes to help her quit smoking   Substance and Sexual Activity  . Alcohol use: No  . Drug use: No  . Sexual activity: Not Currently  Other Topics Concern  . 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  Off and on    Alcohol Use:  None   Drug Use:  None   Diet:  Regular   Exercise:  None   Hobbies:  Reading, Traveling                    Family History  Problem Relation Age of Onset  . Cancer Mother 28       Breast   . Marfan syndrome Father   . Heart disease Father   . Marfan syndrome Sister   . Marfan syndrome Paternal Grandmother   . Marfan syndrome Sister      ROS:  Please see the history of present illness.     All other systems reviewed and negative.    Physical Exam: Blood pressure 118/70, pulse 68, height 6\' 2"  (1.88 m), weight 185 lb (83.9 kg), SpO2 98 %. General: Well developed, well nourished female in no acute distress. Head: Normocephalic, atraumatic, sclera non-icteric, no xanthomas, nares are without discharge. EENT: normal  Lymph Nodes:  none Neck: Negative for carotid bruits. JVD not elevated. Back:without scoliosis kyphosis Lungs: Clear bilaterally to auscultation without wheezes, rales, or rhonchi. Breathing is unlabored. Heart: RRR with mechanical P3-X9 and a 2/6 systolic murmur . No rubs, or gallops appreciated. Abdomen: Soft, non-tender,  non-distended with normoactive bowel sounds. No hepatomegaly. No rebound/guarding. No obvious abdominal masses. Msk:  Strength and tone appear normal for age. Extremities: No clubbing or cyanosis. No  edema.  Distal pedal pulses are 2+ and equal bilaterally. Skin: Warm and Dry Neuro: Alert and oriented X 3. CN III-XII intact Grossly normal sensory and motor function . Psych:  Responds to questions appropriately with a normal  affect.                 Labs: Cardiac Enzymes RecentLabs(last2labs)  No results for input(s): CKTOTAL, CKMB, TROPONINI in the last 72 hours.   CBC RecentLabs       Lab Results  Component Value Date   WBC 7.7 10/31/2017   HGB 14.4 10/31/2017   HCT 44.0 10/31/2017   MCV 88.4 10/31/2017   PLT 244.0 10/31/2017     PROTIME: RecentLabs(last2labs)  No results for input(s): LABPROT, INR in the last 72 hours.   Chemistry  LastLabs  No results for input(s): NA, K, CL, CO2, BUN, CREATININE, CALCIUM, PROT, BILITOT, ALKPHOS, ALT, AST, GLUCOSE in the last 168 hours.  Invalid input(s): LABALBU   Lipids RecentLabs       Lab Results  Component Value Date   CHOL 244 (H) 10/31/2017   HDL 44.70 10/31/2017   LDLCALC 167 (H) 10/31/2017   TRIG 160.0 (H) 10/31/2017     BNP LastLabs  No results found for: PROBNP   Thyroid Function Tests:  RecentLabs(last2labs)  No results for input(s): TSH, T4TOTAL, T3FREE, THYROIDAB in the last 72 hours.  Invalid input(s): FREET3   Miscellaneous RecentLabs  No results found for: DDIMER    Radiology/Studies:  ImagingResults  No results found.    EKG: Sinus rhythm at 76 Interval 17/09/41 Frequent ventricular ectopy with a superior axis right bundle branch block morphology all all all right right right so amiodarone is not using daily drug so we will put him back to daily and was distant so they are all in the same category you go up and toxicity sinus rhythm at 60 nasal interval  26/09/47 persistent atrial fibrillation   Assessment and Plan:  Nonischemic cardiomyopathy  Complex ventricular ectopy with couplets frequent PVCs.  Pattern of bigeminy--quadrigeminy  Marfan syndrome status post aortic root replacement and aortic valve replacement  Syncope  Smoking; trying to quit   The patient has complex ventricular ectopy in the context of her persistent cardiomyopathy.  It is not clear whether there is any contribution to her cardiomyopathy from her ventricular ectopy, but given its symptomatic nature and its burden i.e. 23%, it is important that we reduce  Burden.  Given her cardiomyopathy, I would begin first with medications directed at it; there are good data for the value of Entresto.  I will review this with Dr. Percival Spanish.  Given her syncope and its association with palpitations, it is reasonable to presume that there is arrhythmic contribution.  Syncope in the context of nonischemic cardia myopathy is a high risk marker and ICD implantation is recommended for both primary prevention as well as possibly secondary prevention.  Still, her syncope history is since long duration, and is associate with a prodrome suggestive of a neurally mediated reflex, that it could be ventricular ectopy triggering these reflexes.  In any case, an ICD would be appropriate for primary prevention.  We will also directed therapy and try to get rid of the ventricular ectopy as a possible trigger.  She is reminded that driving in [place] is precluded for 6 months in the context of cardiomyopathy and complex ventricular ectopy.  We will undertake her ICD implantation without interruption of anticoagulation.  Have reviewed the potential benefits and risks of ICD implantation including but not limited to death, perforation of heart or lung, lead dislodgement, infection,  device malfunction and inappropriate shocks.  The patient express understanding  and are willing to  proceed.  Virl Axe  EP Attending  Patient seen and examined. Agree with above. The patient is referred today by Dr. Renaldo Reel for insertion of an ICD. She has a non-ischemic CM, despite maximal medical therapy, EF 30%, s/p prior mechanical MVR/AVR/Bentall. She has a h/o syncope and dense ventricular ectopy. She has reduced her dose of coumadin and her INR is 2.2 today. I have reviewed the indications/risks/benefits/goals/expectations of ICD insertion and she wishes to proceed.  Mikle Bosworth.D.

## 2018-01-05 NOTE — Plan of Care (Signed)
  Problem: Clinical Measurements: Goal: Cardiovascular complication will be avoided Outcome: Progressing   Problem: Skin Integrity: Goal: Risk for impaired skin integrity will decrease Outcome: Progressing   Problem: Cardiac: Goal: Ability to achieve and maintain adequate cardiopulmonary perfusion will improve Outcome: Progressing

## 2018-01-05 NOTE — H&P (Addendum)
ICD Criteria  Current LVEF:30%. Within 12 months prior to implant: Yes   Heart failure history: Yes, Class II  Cardiomyopathy history: Yes, Non-Ischemic Cardiomyopathy.  Atrial Fibrillation/Atrial Flutter: No.  Ventricular tachycardia history: yes, non-sustained  Cardiac arrest history: No.  History of syndromes with risk of sudden death: No.  Previous ICD: No.  Current ICD indication: secondary  PPM indication: No.   Class I or II Bradycardia indication present: No  Beta Blocker therapy for 3 or more months: Yes, prescribed.   Ace Inhibitor/ARB therapy for 3 or more months: No, patient reason.

## 2018-01-05 NOTE — Progress Notes (Signed)
Received patient post ICD placement, alert and oriented. Left chest site dressing CDI, reinforced to keep arm on sling next to her body, patient verbalized understanding. Patient complaining of headache 4/10,PRN meds given. Will monitor.

## 2018-01-05 NOTE — Discharge Summary (Addendum)
ELECTROPHYSIOLOGY PROCEDURE DISCHARGE SUMMARY    Patient ID: Melissa Hayden,  MRN: [id number], DOB/AGE: 42-Mar-1977 42 y.o.  Admit date: 01/05/2018 Discharge date: 01/06/2018  Primary Care Physician: Delorse Limber Primary Cardiologist: Percival Spanish Electrophysiologist: Caryl Comes  Primary Discharge Diagnosis:  Marfan syndrome, syncope, and complex ventricular ectopy status post ICD implant this admission  Secondary Discharge Diagnosis:  1.  S/p Bentall and mechanical aortic and mitral valve replacements 2.  Depression 3.  Bipolar disorder  No Known Allergies   Procedures This Admission:  1.  Implantation of a MDT single chamber ICD on 01/05/18 by Dr Lovena Le.  The patient received a MDT model number Visia ICD with model number 1740 right ventricular lead.  DFT's were successful at 1 J.  There were no immediate post procedure complications. 2.  CXR on 01/06/18 demonstrated no pneumothorax status post device implantation.   Brief HPI: Melissa Hayden is a 42 y.o. female was referred to electrophysiology in the outpatient setting for consideration of ICD implantation.  Past medical history includes syncope, Marfan's syndrome, and complex ventricular ectopy.    Risks, benefits, and alternatives to ICD implantation for secondary prevention were reviewed with the patient who wished to proceed.   Hospital Course:  The patient was admitted and underwent implantation of a MDT single chamber ICD with details as outlined above. She was monitored on telemetry overnight which demonstrated sinus rhythm.  Left chest was without hematoma or ecchymosis.  The device was interrogated and found to be functioning normally.  CXR was obtained and demonstrated no pneumothorax status post device implantation.  Wound care, arm mobility, and restrictions were reviewed with the patient.  The patient was examined and considered stable for discharge to home.   The patient's discharge medications include an ACE-I  (Lisinoril) and beta blocker (Coreg).   Physical Exam: Vitals:   01/05/18 1737 01/05/18 1952 01/05/18 2344 01/06/18 0618  BP: 132/73 (!) 99/58 (!) 106/54 127/60  Pulse: 61 63 66 65  Resp:  18 18 18   Temp:  98.3 F (36.8 C) 98.7 F (37.1 C) 98.2 F (36.8 C)  TempSrc:  Oral Oral Oral  SpO2:  97% 95% 96%  Weight:    179 lb 8 oz (81.4 kg)  Height:        GEN- The patient is well appearing, alert and oriented x 3 today.   HEENT: normocephalic, atraumatic; sclera clear, conjunctiva pink; hearing intact; oropharynx clear; neck supple  Lungs- Clear to ausculation bilaterally, normal work of breathing.  No wheezes, rales, rhonchi Heart- Regular rate and rhythm  GI- soft, non-tender, non-distended, bowel sounds present, no hepatosplenomegaly Extremities- no clubbing, cyanosis, or edema; DP/PT/radial pulses 2+ bilaterally MS- no significant deformity or atrophy Skin- warm and dry, no rash or lesion, left chest without hematoma/ecchymosis Psych- euthymic mood, full affect Neuro- strength and sensation are intact   Labs:   Lab Results  Component Value Date   WBC 6.5 01/02/2018   HGB 13.6 01/02/2018   HCT 41.7 01/02/2018   MCV 88 01/02/2018   PLT 251 01/02/2018    Recent Labs  Lab 01/02/18 0855  NA 140  K 3.9  CL 103  CO2 25  BUN 8  CREATININE 0.78  CALCIUM 9.1  GLUCOSE 98    Discharge Medications:  Allergies as of 01/06/2018   No Known Allergies     Medication List    TAKE these medications   carvedilol 6.25 MG tablet Commonly known as:  COREG Take 1  tablet (6.25 mg total) by mouth 2 (two) times daily.   cetirizine 10 MG tablet Commonly known as:  ZYRTEC ALLERGY Take 1 tablet (10 mg total) by mouth daily. What changed:  when to take this   citalopram 20 MG tablet Commonly known as:  CELEXA Take 1 tablet (20 mg total) by mouth daily. What changed:  when to take this   Melatonin 5 MG Tabs Take 15 mg by mouth at bedtime.   Melatonin 10 MG Tabs Take 50 mg  by mouth daily.   multivitamin with minerals Tabs tablet Take 1 tablet by mouth daily. Centrum Ultra Women's Oral   OLANZapine 15 MG tablet Commonly known as:  ZYPREXA Take 1 tablet (15 mg total) by mouth at bedtime.   traZODone 50 MG tablet Commonly known as:  DESYREL Take 1 tablet (50 mg total) by mouth at bedtime as needed for sleep. What changed:  when to take this   TYLENOL 8 HOUR ARTHRITIS PAIN 650 MG CR tablet Generic drug:  acetaminophen Take 1,300 mg by mouth every 8 (eight) hours as needed (for headache/pain relief).   warfarin 5 MG tablet Commonly known as:  COUMADIN Take as directed. If you are unsure how to take this medication, talk to your nurse or doctor. Original instructions:  Take 1 tablet (5 mg) as directed on M, W, F. Takes 7.5 mg on other days What changed:    how much to take  how to take this  when to take this  additional instructions   warfarin 7.5 MG tablet Commonly known as:  COUMADIN Take as directed. If you are unsure how to take this medication, talk to your nurse or doctor. Original instructions:  Take 1 tablet (7.5 mg) on T, Th, Sa, Su. Takes 5 mg on other days. What changed:    how much to take  how to take this  when to take this  additional instructions       Disposition:  Discharge Instructions    Diet - low sodium heart healthy   Complete by:  As directed    Increase activity slowly   Complete by:  As directed      Follow-up Information    [facility] Follow up on 01/16/2018.   Specialty:  Cardiology Why:  at [facility] information: [address] [place]       Deboraha Sprang, MD Follow up on 04/11/2018.   Specialty:  Cardiology Contact information: [address] [address] [phone]           Duration of Discharge Encounter: Greater than 30 minutes including physician time.  Signed, Chanetta Marshall,  NP 01/06/2018 9:00 AM  EP Attending  Patient seen and examined. Agree with the findings as noted above. The patient is s/p insertion of a single chamber medtronic ICD. Her device has been interogated under my direct supervision and is working normally. CXR is ok. She will return for an incision check as above. I would like her to have her INR checked in a couple of days. Her goal INR is less than 2.5 over the next few days.   Mikle Bosworth.D.

## 2018-01-05 NOTE — Progress Notes (Signed)
Reinforcing to patient importance of keeping left arm in sling and next to body.

## 2018-01-05 NOTE — Discharge Instructions (Signed)
° ° °  Supplemental Discharge Instructions for  °Pacemaker/Defibrillator Patients ° °Activity °No heavy lifting or vigorous activity with your left/right arm for 6 to 8 weeks.  Do not raise your left/right arm above your head for one week.  Gradually raise your affected arm as drawn below. ° °        ° °__      01/09/18                     01/10/18                         01/11/18                    01/12/18 ° °NO DRIVING for 1 week    ; you may begin driving on  01/12/18   . ° °WOUND CARE °- Keep the wound area clean and dry.  Do not get this area wet for one week. No showers for one week; you may shower on   01/12/18  . °- The tape/steri-strips on your wound will fall off; do not pull them off.  No bandage is needed on the site.  DO  NOT apply any creams, oils, or ointments to the wound area. °- If you notice any drainage or discharge from the wound, any swelling or bruising at the site, or you develop a fever > 101? F after you are discharged home, call the office at once. ° °Special Instructions °- You are still able to use cellular telephones; use the ear opposite the side where you have your pacemaker/defibrillator.  Avoid carrying your cellular phone near your device. °- When traveling through airports, show security personnel your identification card to avoid being screened in the metal detectors.  Ask the security personnel to use the hand wand. °- Avoid arc welding equipment, MRI testing (magnetic resonance imaging), TENS units (transcutaneous nerve stimulators).  Call the office for questions about other devices. °- Avoid electrical appliances that are in poor condition or are not properly grounded. °- Microwave ovens are safe to be near or to operate. ° °Additional information for defibrillator patients should your device go off: °- If your device goes off ONCE and you feel fine afterward, notify the device clinic nurses. °- If your device goes off ONCE and you do not feel well afterward, call 911. °- If your device  goes off TWICE, call 911. °- If your device goes off THREE times in one day, call 911. ° °DO NOT DRIVE YOURSELF OR A FAMILY MEMBER °WITH A DEFIBRILLATOR TO THE HOSPITAL--CALL 911. ° °

## 2018-01-06 ENCOUNTER — Telehealth: Payer: Self-pay | Admitting: Internal Medicine

## 2018-01-06 ENCOUNTER — Encounter (HOSPITAL_COMMUNITY): Payer: Self-pay | Admitting: Internal Medicine

## 2018-01-06 ENCOUNTER — Ambulatory Visit (HOSPITAL_COMMUNITY): Payer: BLUE CROSS/BLUE SHIELD

## 2018-01-06 ENCOUNTER — Encounter: Payer: Self-pay | Admitting: Nurse Practitioner

## 2018-01-06 DIAGNOSIS — I5022 Chronic systolic (congestive) heart failure: Secondary | ICD-10-CM | POA: Diagnosis not present

## 2018-01-06 DIAGNOSIS — F329 Major depressive disorder, single episode, unspecified: Secondary | ICD-10-CM | POA: Diagnosis not present

## 2018-01-06 DIAGNOSIS — F319 Bipolar disorder, unspecified: Secondary | ICD-10-CM | POA: Diagnosis not present

## 2018-01-06 DIAGNOSIS — Z952 Presence of prosthetic heart valve: Secondary | ICD-10-CM | POA: Diagnosis not present

## 2018-01-06 DIAGNOSIS — J449 Chronic obstructive pulmonary disease, unspecified: Secondary | ICD-10-CM | POA: Diagnosis not present

## 2018-01-06 DIAGNOSIS — I428 Other cardiomyopathies: Secondary | ICD-10-CM | POA: Diagnosis not present

## 2018-01-06 DIAGNOSIS — I472 Ventricular tachycardia: Secondary | ICD-10-CM | POA: Diagnosis not present

## 2018-01-06 DIAGNOSIS — R55 Syncope and collapse: Secondary | ICD-10-CM | POA: Diagnosis not present

## 2018-01-06 DIAGNOSIS — Z9889 Other specified postprocedural states: Secondary | ICD-10-CM

## 2018-01-06 DIAGNOSIS — F17210 Nicotine dependence, cigarettes, uncomplicated: Secondary | ICD-10-CM | POA: Diagnosis not present

## 2018-01-06 DIAGNOSIS — Z7901 Long term (current) use of anticoagulants: Secondary | ICD-10-CM

## 2018-01-06 DIAGNOSIS — Z006 Encounter for examination for normal comparison and control in clinical research program: Secondary | ICD-10-CM | POA: Diagnosis not present

## 2018-01-06 DIAGNOSIS — I509 Heart failure, unspecified: Secondary | ICD-10-CM | POA: Diagnosis not present

## 2018-01-06 DIAGNOSIS — Z79899 Other long term (current) drug therapy: Secondary | ICD-10-CM | POA: Diagnosis not present

## 2018-01-06 LAB — PROTIME-INR
INR: 2.42
Prothrombin Time: 26.1 seconds — ABNORMAL HIGH (ref 11.4–15.2)

## 2018-01-06 NOTE — Plan of Care (Signed)
  Problem: Education: Goal: Knowledge of General Education information will improve Outcome: Adequate for Discharge   Problem: Health Behavior/Discharge Planning: Goal: Ability to manage health-related needs will improve Outcome: Adequate for Discharge   Problem: Clinical Measurements: Goal: Ability to maintain clinical measurements within normal limits will improve Outcome: Adequate for Discharge Goal: Will remain free from infection Outcome: Adequate for Discharge Goal: Diagnostic test results will improve Outcome: Adequate for Discharge Goal: Respiratory complications will improve Outcome: Adequate for Discharge Goal: Cardiovascular complication will be avoided Outcome: Adequate for Discharge   Problem: Activity: Goal: Risk for activity intolerance will decrease Outcome: Adequate for Discharge   Problem: Nutrition: Goal: Adequate nutrition will be maintained Outcome: Adequate for Discharge   Problem: Coping: Goal: Level of anxiety will decrease Outcome: Adequate for Discharge   Problem: Elimination: Goal: Will not experience complications related to bowel motility Outcome: Adequate for Discharge Goal: Will not experience complications related to urinary retention Outcome: Adequate for Discharge   Problem: Pain Managment: Goal: General experience of comfort will improve Outcome: Adequate for Discharge   Problem: Safety: Goal: Ability to remain free from injury will improve Outcome: Adequate for Discharge   Problem: Skin Integrity: Goal: Risk for impaired skin integrity will decrease Outcome: Adequate for Discharge   Problem: Education: Goal: Knowledge of cardiac device and self-care will improve Outcome: Adequate for Discharge Goal: Ability to safely manage health related needs after discharge will improve Outcome: Adequate for Discharge   Problem: Cardiac: Goal: Ability to achieve and maintain adequate cardiopulmonary perfusion will improve Outcome:  Adequate for Discharge

## 2018-01-06 NOTE — Progress Notes (Signed)
Pt had 6 beats of Vtach. Pt asymptomatic. Denies any chest pains.continued to observe closely

## 2018-01-06 NOTE — Telephone Encounter (Signed)
°  New message  Pt verbalized that she is calling for R  Pt c/o medication issue:  1. Name of Medication: Warfarin  2. How are you currently taking this medication (dosage and times per day)?  warfarin (COUMADIN) 5 MG tablet Take 1 tablet (5 mg) as directed on M, W, F. Takes 7.5 mg on other days Patient taking differently: Take 5 mg by mouth every Monday, Wednesday, and Friday.     warfarin (COUMADIN) 7.5 MG tablet Take 1 tablet (7.5 mg) on T, Th, Sa, Su. Takes 5 mg on other days. Patient taking differently: Take 7.5 mg by mouth every Tuesday, Thursday, Saturday, and Sunday.        3. Are you having a reaction (difficulty breathing--STAT)? no 4. What is your medication issue?  Pt want rn to go over which dosage she should take

## 2018-01-06 NOTE — Telephone Encounter (Signed)
PT/INR this AM - 26.1/2.42

## 2018-01-06 NOTE — Telephone Encounter (Signed)
Reviewed with Dr Lovena Le.  Pt to take warfarin as ordered prior to the decrease for the gen change - meaning 7.5 mg alternating with 5 mg.  She is to repeat INR on Monday or Tuesday.  Pt aware and states understanding.  She will come in on Monday for the PT/INR.

## 2018-01-09 ENCOUNTER — Other Ambulatory Visit: Payer: BLUE CROSS/BLUE SHIELD | Admitting: *Deleted

## 2018-01-09 DIAGNOSIS — Z7901 Long term (current) use of anticoagulants: Secondary | ICD-10-CM

## 2018-01-09 DIAGNOSIS — Z9889 Other specified postprocedural states: Secondary | ICD-10-CM | POA: Diagnosis not present

## 2018-01-09 DIAGNOSIS — Z952 Presence of prosthetic heart valve: Secondary | ICD-10-CM

## 2018-01-10 ENCOUNTER — Other Ambulatory Visit: Payer: Self-pay | Admitting: Emergency Medicine

## 2018-01-10 ENCOUNTER — Telehealth: Payer: Self-pay | Admitting: Internal Medicine

## 2018-01-10 DIAGNOSIS — Z952 Presence of prosthetic heart valve: Secondary | ICD-10-CM

## 2018-01-10 DIAGNOSIS — F3162 Bipolar disorder, current episode mixed, moderate: Secondary | ICD-10-CM

## 2018-01-10 LAB — PROTIME-INR
INR: 4.4 — AB (ref 0.8–1.2)
PROTHROMBIN TIME: 42.3 s — AB (ref 9.1–12.0)

## 2018-01-10 MED ORDER — TRAZODONE HCL 50 MG PO TABS: 50.0000 mg | ORAL_TABLET | Freq: Every evening | ORAL | 1 refills | Status: DC | PRN

## 2018-01-10 NOTE — Telephone Encounter (Signed)
New message    Patient calling seen that her results for PTINR  Was higher than normal , and would like to know should she make medication adjustment

## 2018-01-10 NOTE — Telephone Encounter (Signed)
Left CVM per DPR notifying of warfarin instructions (see Pt email).

## 2018-01-13 ENCOUNTER — Other Ambulatory Visit: Payer: Self-pay

## 2018-01-16 ENCOUNTER — Ambulatory Visit (INDEPENDENT_AMBULATORY_CARE_PROVIDER_SITE_OTHER): Payer: BLUE CROSS/BLUE SHIELD | Admitting: *Deleted

## 2018-01-16 DIAGNOSIS — I428 Other cardiomyopathies: Secondary | ICD-10-CM | POA: Diagnosis not present

## 2018-01-16 NOTE — Progress Notes (Signed)
Wound check appointment. Steri-strips removed. Wound without redness or edema. Incision edges approximated, wound well healed. Normal device function. Threshold, sensing, and impedances consistent with implant measurements. Device programmed at 3.5V for extra safety margin until 3 month visit. Histogram distribution appropriate for patient and level of activity. 8 AF episodes ~ EGMS ectopy/ freq. PVCs.  No ventricular arrhythmias noted. Patient educated about wound care, arm mobility, lifting restrictions, shock plan. ROV 04/11/18 w/ SK.

## 2018-01-29 ENCOUNTER — Encounter: Payer: Self-pay | Admitting: Internal Medicine

## 2018-01-30 ENCOUNTER — Telehealth: Payer: Self-pay | Admitting: Cardiology

## 2018-01-30 ENCOUNTER — Ambulatory Visit (INDEPENDENT_AMBULATORY_CARE_PROVIDER_SITE_OTHER): Payer: Self-pay | Admitting: *Deleted

## 2018-01-30 DIAGNOSIS — Z95810 Presence of automatic (implantable) cardiac defibrillator: Secondary | ICD-10-CM

## 2018-01-30 NOTE — Telephone Encounter (Signed)
LVM for pt to called device clinic back to schedule an apt to check incision site today.

## 2018-01-30 NOTE — Progress Notes (Signed)
Wound re-check, stitch visible on medial incision line, stitch clipped, antibiotic ointment and band aid applied, advised pt to leave band aid on for today and to wash gently with soap and water tomorrow advised pt that since she is on coumadin there may be a small drop of blood on Band-Aid advised pt to call back if site does not start to look less red and heal by Friday. Pt voiced understanding.

## 2018-01-30 NOTE — Telephone Encounter (Signed)
Patient agreed to an appt today at 2:30 PM with Device Clinic.

## 2018-02-15 DIAGNOSIS — G43909 Migraine, unspecified, not intractable, without status migrainosus: Secondary | ICD-10-CM | POA: Diagnosis not present

## 2018-02-15 DIAGNOSIS — I493 Ventricular premature depolarization: Secondary | ICD-10-CM | POA: Diagnosis not present

## 2018-02-15 DIAGNOSIS — I517 Cardiomegaly: Secondary | ICD-10-CM | POA: Diagnosis not present

## 2018-02-15 DIAGNOSIS — E860 Dehydration: Secondary | ICD-10-CM | POA: Diagnosis not present

## 2018-02-15 DIAGNOSIS — R112 Nausea with vomiting, unspecified: Secondary | ICD-10-CM | POA: Diagnosis not present

## 2018-02-15 DIAGNOSIS — Z3202 Encounter for pregnancy test, result negative: Secondary | ICD-10-CM | POA: Diagnosis not present

## 2018-02-17 ENCOUNTER — Other Ambulatory Visit: Payer: Self-pay | Admitting: Internal Medicine

## 2018-03-29 ENCOUNTER — Other Ambulatory Visit: Payer: Self-pay | Admitting: Physician Assistant

## 2018-03-31 ENCOUNTER — Encounter: Payer: Self-pay | Admitting: Physician Assistant

## 2018-03-31 ENCOUNTER — Ambulatory Visit: Payer: BLUE CROSS/BLUE SHIELD | Admitting: Physician Assistant

## 2018-03-31 ENCOUNTER — Other Ambulatory Visit: Payer: Self-pay

## 2018-03-31 VITALS — BP 104/72 | HR 88 | Temp 98.8°F | Resp 15 | Ht 74.0 in | Wt 175.8 lb

## 2018-03-31 DIAGNOSIS — H6122 Impacted cerumen, left ear: Secondary | ICD-10-CM

## 2018-03-31 DIAGNOSIS — F5101 Primary insomnia: Secondary | ICD-10-CM

## 2018-03-31 DIAGNOSIS — Z716 Tobacco abuse counseling: Secondary | ICD-10-CM

## 2018-03-31 MED ORDER — ESZOPICLONE 1 MG PO TABS: 1.0000 mg | ORAL_TABLET | Freq: Every evening | ORAL | 0 refills | Status: DC | PRN

## 2018-03-31 MED ORDER — NICOTINE 21 MG/24HR TD PT24: 21.0000 mg | MEDICATED_PATCH | Freq: Every day | TRANSDERMAL | 0 refills | Status: DC

## 2018-03-31 NOTE — Patient Instructions (Signed)
Please use some hydrogen peroxide into the left ear a couple of times per week. This will help dry up loose watery wax and breakup the harder pieces so that they can come out. Drain out the ear well after the peroxide stops bubbling.  Try out the [facility] as directed. Cut back to 10 mg melatonin nightly for now. Start the Nicoderm patch, using as directed.  We will lower the dose of nicoderm next month.

## 2018-03-31 NOTE — Progress Notes (Signed)
Patient presents to clinic today to discuss multiple issues.   Patient notes some drainage from her left ear over the past couple of weeks. Denies ear pain, tinnitus, change in hearing. Denies URI symptoms or symptoms of R ear. Denies fever, chills.  Patient also wishing to discuss options for smoking cessation. Has tried to quit previously on her own but was unsuccessful. She feels this is mainly due to not really being ready to quit.   Also patient with significant issue sleeping despite use of Trazodone. States she takes nightly and was initially very helpful with sleep. Now she cannot tell a difference if she takes or not. Is having a very hard time falling and staying asleep, only averaging a couple of hours of sleep per night.  Past Medical History:  Diagnosis Date  . Anticoagulant long-term use    after valve surgery for Marfan's syndrome  . Anxiety   . Ascending aorta dilatation (HCC)   . Bipolar disorder ([place])   . Depression   . Marfan's syndrome    Dr. Atilano Median  . Migraine   . Mitral valve prolapse   . Palpitations   . Suicidal behavior    Tylenol Overdose  . Varicose veins     Current Outpatient Medications on File Prior to Visit  Medication Sig Dispense Refill  . acetaminophen (TYLENOL 8 HOUR ARTHRITIS PAIN) 650 MG CR tablet Take 1,300 mg by mouth every 8 (eight) hours as needed (for headache/pain relief).    . butalbital-acetaminophen-caffeine (FIORICET, ESGIC) 50-325-40 MG tablet Take by mouth.    . carvedilol (COREG) 6.25 MG tablet Take 1 tablet (6.25 mg total) by mouth 2 (two) times daily. 60 tablet 6  . cetirizine (ZYRTEC ALLERGY) 10 MG tablet Take 1 tablet (10 mg total) by mouth daily. (Patient taking differently: Take 10 mg by mouth at bedtime. ) 90 tablet 1  . citalopram (CELEXA) 20 MG tablet Take 1 tablet (20 mg total) by mouth at bedtime. 90 tablet 0  . Melatonin 10 MG TABS Take 50 mg by mouth daily. 90 tablet 1  . Multiple Vitamin (MULTIVITAMIN WITH MINERALS)  TABS tablet Take 1 tablet by mouth daily. Centrum Ultra Women's Oral    . OLANZapine (ZYPREXA) 15 MG tablet Take 1 tablet (15 mg total) by mouth at bedtime. 90 tablet 1  . ondansetron (ZOFRAN-ODT) 4 MG disintegrating tablet TAKE 1 TABLET BY MOUTH EVERY 8 HOURS AS NEEDED FOR UP TO 7 DAYS  0  . warfarin (COUMADIN) 5 MG tablet Take 1 tablet (5 mg) as directed on M, W, F. Takes 7.5 mg on other days (Patient taking differently: Take 5 mg by mouth every Monday, Wednesday, and Friday. ) 90 tablet 1  . warfarin (COUMADIN) 7.5 MG tablet Take 1 tablet (7.5 mg) on T, Th, Sa, Su. Takes 5 mg on other days. (Patient taking differently: Take 7.5 mg by mouth every Tuesday, Thursday, Saturday, and Sunday. ) 90 tablet 1  . Melatonin 5 MG TABS Take 15 mg by mouth at bedtime.     No current facility-administered medications on file prior to visit.     No Known Allergies  Family History  Problem Relation Age of Onset  . Cancer Mother 78       Breast   . Marfan syndrome Father   . Heart disease Father   . Marfan syndrome Sister   . Marfan syndrome Paternal Grandmother   . Marfan syndrome Sister     Social History   Socioeconomic History  .  Marital status: Divorced    Spouse name: Not on file  . Number of children: Not on file  . Years of education: 54  . Highest education level: Not on file  Occupational History  . Occupation: [employer]  Social Needs  . Financial resource strain: Not on file  . Food insecurity:    Worry: Not on file    Inability: Not on file  . Transportation needs:    Medical: Not on file    Non-medical: Not on file  Tobacco Use  . Smoking status: Current Every Day Smoker    Packs/day: 2.00    Years: 18.00    Pack years: 36.00    Types: Cigarettes  . Smokeless tobacco: Current User  . Tobacco comment: Currently using e-cigarrettes to help her quit smoking   Substance and Sexual Activity  . Alcohol use: No  . Drug use: No  . Sexual activity: Not Currently  Lifestyle  .  Physical activity:    Days per week: Not on file    Minutes per session: Not on file  . Stress: Not on file  Relationships  . Social connections:    Talks on phone: Not on file    Gets together: Not on file    Attends religious service: Not on file    Active member of club or organization: Not on file    Attends meetings of clubs or organizations: Not on file    Relationship status: Not on file  Other Topics Concern  . 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  Off and on    Alcohol Use:  None   Drug Use:  None   Diet:  Regular   Exercise:  None   Hobbies:  Reading, Traveling             Review of Systems - See HPI.  All other ROS are negative.  BP 104/72   Pulse 88   Temp 98.8 F (37.1 C) (Oral)   Resp 15   Ht 6\' 2"  (1.88 m)   Wt 175 lb 12.8 oz (79.7 kg)   SpO2 97%   BMI 22.57 kg/m   Physical Exam  Constitutional: She is oriented to person, place, and time. She appears well-developed and well-nourished.  HENT:  Head: Normocephalic and atraumatic.  Right Ear: External ear normal.  Nose: Nose normal.  Mouth/Throat: Oropharynx is clear and moist.  Cerumen noted of left ear.  Eyes: Conjunctivae are normal.  Neck: Neck supple.  Cardiovascular: Normal rate, regular rhythm and normal heart sounds.  Pulmonary/Chest: Effort normal.  Neurological: She is alert and oriented to person, place, and time.  Psychiatric: She has a normal mood and affect.  Vitals reviewed.   Recent Results (from the past 2160 hour(s))  CBC with Differential/Platelet     Status: None   Collection Time: 01/02/18  8:55 AM  Result Value Ref Range   WBC 6.5 3.4 - 10.8 x10E3/uL   RBC 4.76 3.77 - 5.28 x10E6/uL   Hemoglobin 13.6 11.1 - 15.9 g/dL   Hematocrit 41.7 34.0 - 46.6 %   MCV 88 79 - 97 fL   MCH 28.6 26.6 - 33.0 pg     MCHC 32.6 31.5 - 35.7 g/dL   RDW 15.2  12.3 - 15.4 %   Platelets 251 150 - 379 x10E3/uL   Neutrophils 57 Not Estab. %   Lymphs 33 Not Estab. %   Monocytes 8 Not Estab. %   Eos 2 Not Estab. %   Basos 0 Not Estab. %   Neutrophils Absolute 3.6 1.4 - 7.0 x10E3/uL   Lymphocytes Absolute 2.2 0.7 - 3.1 x10E3/uL   Monocytes Absolute 0.5 0.1 - 0.9 x10E3/uL   EOS (ABSOLUTE) 0.2 0.0 - 0.4 x10E3/uL   Basophils Absolute 0.0 0.0 - 0.2 x10E3/uL   Immature Granulocytes 0 Not Estab. %   Immature Grans (Abs) 0.0 0.0 - 0.1 O37C5/YI  Basic metabolic panel     Status: None   Collection Time: 01/02/18  8:55 AM  Result Value Ref Range   Glucose 98 65 - 99 mg/dL   BUN 8 6 - 24 mg/dL   Creatinine, Ser 0.78 0.57 - 1.00 mg/dL   GFR calc non Af Amer 95 >59 mL/min/1.73   GFR calc Af Amer 109 >59 mL/min/1.73   BUN/Creatinine Ratio 10 9 - 23   Sodium 140 134 - 144 mmol/L   Potassium 3.9 3.5 - 5.2 mmol/L   Chloride 103 96 - 106 mmol/L   CO2 25 20 - 29 mmol/L   Calcium 9.1 8.7 - 10.2 mg/dL  INR/PT     Status: Abnormal   Collection Time: 01/02/18  8:55 AM  Result Value Ref Range   INR 3.6 (H) 0.8 - 1.2    Comment: Reference interval is for non-anticoagulated patients. Suggested INR therapeutic range for Vitamin K antagonist therapy:    Standard Dose (moderate intensity                   therapeutic range):       2.0 - 3.0    Higher intensity therapeutic range       2.5 - 3.5    Prothrombin Time 37.9 (H) 9.1 - 12.0 sec  Protime-INR     Status: None   Collection Time: 01/02/18  8:55 AM  Result Value Ref Range   INR CANCELED     Comment: Please refer to the following specimen for additional lab results. SEE [id number] 0 Reference interval is for non-anticoagulated patients. Suggested INR therapeutic range for Vitamin K antagonist therapy:    Standard Dose (moderate intensity                   therapeutic range):       2.0 - 3.0    Higher intensity therapeutic range       2.5 - 3.5  Result  canceled by the ancillary.    Prothrombin Time CANCELED     Comment: Test not performed  Result canceled by the ancillary.   Protime-INR     Status: Abnormal   Collection Time: 01/05/18  9:55 AM  Result Value Ref Range   Prothrombin Time 24.5 (H) 11.4 - 15.2 seconds   INR 2.23     Comment: Performed at [facility] [address], [place], [place] [address]  Surgical PCR screen     Status: None   Collection Time: 01/05/18 10:03 AM  Result Value Ref Range   MRSA, PCR NEGATIVE NEGATIVE   Staphylococcus aureus NEGATIVE NEGATIVE    Comment: (NOTE) The Xpert SA Assay (FDA approved for NASAL specimens in patients 54 years of age and older), is one component of a comprehensive surveillance program. It is not intended to diagnose infection  nor to guide or monitor treatment. Performed at [facility], [place] [address], [place], [place] [address]   Protime-INR     Status: Abnormal   Collection Time: 01/06/18  5:50 AM  Result Value Ref Range   Prothrombin Time 26.1 (H) 11.4 - 15.2 seconds   INR 2.42     Comment: Performed at [facility] [address] [place], [place] [address]  Protime-INR     Status: Abnormal   Collection Time: 01/09/18  1:46 PM  Result Value Ref Range   INR 4.4 (H) 0.8 - 1.2    Comment: Reference interval is for non-anticoagulated patients. Suggested INR therapeutic range for Vitamin K antagonist therapy:    Standard Dose (moderate intensity                   therapeutic range):       2.0 - 3.0    Higher intensity therapeutic range       2.5 - 3.5    Prothrombin Time 42.3 (H) 9.1 - 12.0 sec    Assessment/Plan: 1. Primary insomnia Patient needing to cut back on OTC melatonin. Suspect high dose is preventing her body from creating its own melatonin. Will also stop Trazodone. Start Lunesta nightly.   2. Excessive cerumen in ear canal, left Reviewed proper home removal. Peroxide to the ear canal 1-2 x week over the next 2 weeks. Then as  needed. Irrigation attempted but not successful due to hardening of wax. If home measures not therapeutic, will send to ENT.  3. Encounter for smoking cessation counseling Smoking cessation instruction/counseling given:  counseled patient on the dangers of tobacco use, advised patient to stop smoking, and reviewed strategies to maximize success. Will start trial of Nicoderm.    Leeanne Rio, PA-C

## 2018-04-02 ENCOUNTER — Other Ambulatory Visit: Payer: Self-pay | Admitting: Physician Assistant

## 2018-04-11 ENCOUNTER — Encounter: Payer: Self-pay | Admitting: Internal Medicine

## 2018-04-11 DIAGNOSIS — R21 Rash and other nonspecific skin eruption: Secondary | ICD-10-CM | POA: Diagnosis not present

## 2018-04-11 DIAGNOSIS — R0989 Other specified symptoms and signs involving the circulatory and respiratory systems: Secondary | ICD-10-CM

## 2018-04-12 ENCOUNTER — Encounter: Payer: Self-pay | Admitting: Internal Medicine

## 2018-04-14 ENCOUNTER — Encounter: Payer: Self-pay | Admitting: Physician Assistant

## 2018-04-14 DIAGNOSIS — R229 Localized swelling, mass and lump, unspecified: Secondary | ICD-10-CM

## 2018-04-14 DIAGNOSIS — D229 Melanocytic nevi, unspecified: Secondary | ICD-10-CM

## 2018-04-17 ENCOUNTER — Ambulatory Visit: Payer: BLUE CROSS/BLUE SHIELD | Admitting: Physician Assistant

## 2018-04-17 ENCOUNTER — Encounter: Payer: Self-pay | Admitting: Physician Assistant

## 2018-04-17 ENCOUNTER — Other Ambulatory Visit: Payer: Self-pay

## 2018-04-17 ENCOUNTER — Encounter: Payer: Self-pay | Admitting: Emergency Medicine

## 2018-04-17 VITALS — BP 108/72 | HR 87 | Temp 98.6°F | Resp 17 | Ht 74.0 in | Wt 179.4 lb

## 2018-04-17 DIAGNOSIS — R21 Rash and other nonspecific skin eruption: Secondary | ICD-10-CM | POA: Diagnosis not present

## 2018-04-17 MED ORDER — METHYLPREDNISOLONE ACETATE 80 MG/ML IJ SUSP
80.0000 mg | Freq: Once | INTRAMUSCULAR | Status: AC
  Administered 2018-04-17: 80 mg via INTRAMUSCULAR

## 2018-04-17 NOTE — Patient Instructions (Signed)
Start taking Benadryl every 6 hours as needed for itch. Can add on an evening 150 mg tablet of Zantac as well. I was going to start you on a taper today but since you are seeing the Dermatologist tomorrow morning, I am giving you a dose for today and will let the Dermatologist adjust treatment further.  Symptoms have likely worsened secondary to abrupt cessation of the steroid -- this should have been given as a taper to avoid rebound symptoms.

## 2018-04-17 NOTE — Progress Notes (Signed)
Patient presents to clinic today c/o recurring rash of forehead, arms and torso. Patient endorses noting a pruritic rash of her face, head, and neck last Tuesday. Noted it was significantly pruritic. Was seen at ER in [place]. Was diagnosed with an allergic dermatitis and was started on a 5-day burst of 60 mg Prednisone which she completed yesterday. Noted some mild improvement on steroid, but then symptoms worsened after discontinuation. Denies fever, chills, SOB. Denies change in soaps, lotions, detergents. Denies new pets. Her dogs are not currently having any issues. Denies recent travel or sick contact. Patient states the hydroxyzine she was given in ER did not help with itch. Notes that the rash seems to be migratory. Had rash on torso yesterday that was very red but non-pruritic. Has resolved overnight.   Past Medical History:  Diagnosis Date  . Anticoagulant long-term use    after valve surgery for Marfan's syndrome  . Anxiety   . Ascending aorta dilatation (HCC)   . Bipolar disorder ([place])   . Depression   . Marfan's syndrome    Dr. Atilano Median  . Migraine   . Mitral valve prolapse   . Palpitations   . Suicidal behavior    Tylenol Overdose  . Varicose veins     Current Outpatient Medications on File Prior to Visit  Medication Sig Dispense Refill  . acetaminophen (TYLENOL 8 HOUR ARTHRITIS PAIN) 650 MG CR tablet Take 1,300 mg by mouth every 8 (eight) hours as needed (for headache/pain relief).    . butalbital-acetaminophen-caffeine (FIORICET, ESGIC) 50-325-40 MG tablet Take by mouth.    . carvedilol (COREG) 6.25 MG tablet Take 1 tablet (6.25 mg total) by mouth 2 (two) times daily. 60 tablet 6  . cetirizine (ZYRTEC) 10 MG tablet TAKE 1 TABLET BY MOUTH EVERY DAY 90 tablet 0  . citalopram (CELEXA) 20 MG tablet Take 1 tablet (20 mg total) by mouth at bedtime. 90 tablet 0  . Multiple Vitamin (MULTIVITAMIN WITH MINERALS) TABS tablet Take 1 tablet by mouth daily. Centrum Ultra Women's Oral    .  nicotine (NICODERM CQ) 21 mg/24hr patch Place 1 patch (21 mg total) onto the skin daily. 28 patch 0  . OLANZapine (ZYPREXA) 15 MG tablet Take 1 tablet (15 mg total) by mouth at bedtime. 90 tablet 1  . ondansetron (ZOFRAN-ODT) 4 MG disintegrating tablet TAKE 1 TABLET BY MOUTH EVERY 8 HOURS AS NEEDED FOR UP TO 7 DAYS  0  . warfarin (COUMADIN) 5 MG tablet Take 1 tablet (5 mg) as directed on M, W, F. Takes 7.5 mg on other days (Patient taking differently: Take 5 mg by mouth every Monday, Wednesday, and Friday. ) 90 tablet 1  . warfarin (COUMADIN) 7.5 MG tablet Take 1 tablet (7.5 mg) on T, Th, Sa, Su. Takes 5 mg on other days. (Patient taking differently: Take 7.5 mg by mouth every Tuesday, Thursday, Saturday, and Sunday. ) 90 tablet 1  . eszopiclone (LUNESTA) 1 MG TABS tablet Take 1 tablet (1 mg total) by mouth at bedtime as needed for sleep. Take immediately before bedtime (Patient not taking: Reported on 04/17/2018) 30 tablet 0  . Melatonin 10 MG TABS Take 50 mg by mouth daily. (Patient not taking: Reported on 04/17/2018) 90 tablet 1  . Melatonin 5 MG TABS Take 15 mg by mouth at bedtime.    . predniSONE (DELTASONE) 10 MG tablet TAKE 6 TABLETS (60MG ) BY MOUTH EVERY DAY FOR 5 DAYS  0   No current facility-administered medications on file  prior to visit.     No Known Allergies  Family History  Problem Relation Age of Onset  . Cancer Mother 35       Breast   . Marfan syndrome Father   . Heart disease Father   . Marfan syndrome Sister   . Marfan syndrome Paternal Grandmother   . Marfan syndrome Sister     Social History   Socioeconomic History  . Marital status: Divorced    Spouse name: Not on file  . Number of children: Not on file  . Years of education: 49  . Highest education level: Not on file  Occupational History  . Occupation: [employer]  Social Needs  . Financial resource strain: Not on file  . Food insecurity:    Worry: Not on file    Inability: Not on file  . Transportation needs:     Medical: Not on file    Non-medical: Not on file  Tobacco Use  . Smoking status: Current Every Day Smoker    Packs/day: 2.00    Years: 18.00    Pack years: 36.00    Types: Cigarettes  . Smokeless tobacco: Current User  . Tobacco comment: Currently using e-cigarrettes to help her quit smoking   Substance and Sexual Activity  . Alcohol use: No  . Drug use: No  . Sexual activity: Not Currently  Lifestyle  . Physical activity:    Days per week: Not on file    Minutes per session: Not on file  . Stress: Not on file  Relationships  . Social connections:    Talks on phone: Not on file    Gets together: Not on file    Attends religious service: Not on file    Active member of club or organization: Not on file    Attends meetings of clubs or organizations: Not on file    Relationship status: Not on file  Other Topics Concern  . 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  Off and on    Alcohol Use:  None   Drug Use:  None   Diet:  Regular   Exercise:  None   Hobbies:  Reading, Traveling             Review of Systems - See HPI.  All other ROS are negative.  BP 108/72   Pulse 87   Temp 98.6 F (37 C) (Oral)   Resp 17   Ht 6\' 2"  (1.88 m)   Wt 179 lb 6.4 oz (81.4 kg)   SpO2 96%   BMI 23.03 kg/m   Physical Exam  Constitutional: She appears well-developed and well-nourished.  HENT:  Head: Normocephalic and atraumatic.  Eyes: Conjunctivae are normal.  Neck: Neck supple.  Cardiovascular: Normal rate, regular rhythm and normal heart sounds.  Skin: Skin is intact. Capillary refill takes less than 2 seconds. Rash noted. Rash is maculopapular. Rash is not vesicular.  Maculopapular rash noted of forehead, neck and bilateral forearms, favoring extensor surfaces. Patient with a picture of a rash of torso  noted yesterday that has resolved that almost seems vasculitic in nature.   Psychiatric: She has a normal mood and affect.  Vitals reviewed.   Assessment/Plan: 1. Rash Rash of forehead seems most consistent  with a dermatitis although pictures of rash of stomach seem inconsistent with such. Rash is migratory which is also odd. No hives noted on examination today. Symptoms worsened after abrupt cessation of steroid. Will give IM depomedrol today. Start Benadryl and Zantac to help with itch. OTC lotions reviewed. We have her scheduled with Dermatology tomorrow morning for further assessment. As such, will hold off on steroid taper and labs today. - methylPREDNISolone acetate (DEPO-MEDROL) injection 80 mg   Leeanne Rio, [place]

## 2018-04-18 DIAGNOSIS — L508 Other urticaria: Secondary | ICD-10-CM | POA: Diagnosis not present

## 2018-04-21 DIAGNOSIS — L708 Other acne: Secondary | ICD-10-CM | POA: Diagnosis not present

## 2018-04-21 DIAGNOSIS — L508 Other urticaria: Secondary | ICD-10-CM | POA: Diagnosis not present

## 2018-04-28 DIAGNOSIS — L508 Other urticaria: Secondary | ICD-10-CM | POA: Diagnosis not present

## 2018-04-29 ENCOUNTER — Other Ambulatory Visit: Payer: Self-pay | Admitting: Physician Assistant

## 2018-04-29 DIAGNOSIS — M7989 Other specified soft tissue disorders: Secondary | ICD-10-CM | POA: Diagnosis not present

## 2018-04-29 DIAGNOSIS — S99912A Unspecified injury of left ankle, initial encounter: Secondary | ICD-10-CM | POA: Diagnosis not present

## 2018-04-29 DIAGNOSIS — S93402A Sprain of unspecified ligament of left ankle, initial encounter: Secondary | ICD-10-CM | POA: Diagnosis not present

## 2018-04-29 DIAGNOSIS — Z5320 Procedure and treatment not carried out because of patient's decision for unspecified reasons: Secondary | ICD-10-CM | POA: Diagnosis not present

## 2018-04-29 DIAGNOSIS — S93409A Sprain of unspecified ligament of unspecified ankle, initial encounter: Secondary | ICD-10-CM | POA: Diagnosis not present

## 2018-04-29 DIAGNOSIS — X509XXA Other and unspecified overexertion or strenuous movements or postures, initial encounter: Secondary | ICD-10-CM | POA: Diagnosis not present

## 2018-04-29 DIAGNOSIS — Y93K1 Activity, walking an animal: Secondary | ICD-10-CM | POA: Diagnosis not present

## 2018-04-29 DIAGNOSIS — R791 Abnormal coagulation profile: Secondary | ICD-10-CM | POA: Diagnosis not present

## 2018-04-29 DIAGNOSIS — S99922A Unspecified injury of left foot, initial encounter: Secondary | ICD-10-CM | POA: Diagnosis not present

## 2018-04-30 DIAGNOSIS — Z3202 Encounter for pregnancy test, result negative: Secondary | ICD-10-CM | POA: Diagnosis not present

## 2018-04-30 DIAGNOSIS — R319 Hematuria, unspecified: Secondary | ICD-10-CM | POA: Diagnosis not present

## 2018-04-30 DIAGNOSIS — Z7901 Long term (current) use of anticoagulants: Secondary | ICD-10-CM | POA: Diagnosis not present

## 2018-05-01 MED ORDER — NICOTINE 14 MG/24HR TD PT24: 14.0000 mg | MEDICATED_PATCH | Freq: Every day | TRANSDERMAL | 0 refills | Status: DC

## 2018-05-01 NOTE — Telephone Encounter (Signed)
Last OV 04/17/18, No future OV  Last filled 03/31/18, # 28 with 0 refills

## 2018-05-12 ENCOUNTER — Encounter: Payer: Self-pay | Admitting: Cardiology

## 2018-05-24 ENCOUNTER — Ambulatory Visit: Payer: Self-pay

## 2018-05-24 NOTE — Telephone Encounter (Signed)
Patient called in with c/o "bloody urine." She says "it started 2 days ago, dark red blood with no clots every time I go to the bathroom to urinate. I have no pain, no pressure, and I feel like my bladder is emptying, no hesitation." I asked about any other symptoms, she denies. According to protocol, see PCP within 24 hours, appointment scheduled for tomorrow at 0915 with Raiford Noble, PA-C, care advice given, patient verbalized understanding.   Reason for Disposition . Blood in urine  (Exception: could be normal menstrual bleeding)  Answer Assessment - Initial Assessment Questions 1. COLOR of URINE: "Describe the color of the urine."  (e.g., tea-colored, pink, red, blood clots, bloody)     Dark red, no clots 2. ONSET: "When did the bleeding start?"      2 days ago 3. EPISODES: "How many times has there been blood in the urine?" or "How many times today?"     Every time I go to the bathroom 4. PAIN with URINATION: "Is there any pain with passing your urine?" If so, ask: "How bad is the pain?"  (Scale 1-10; or mild, moderate, severe)    - MILD - complains slightly about urination hurting    - MODERATE - interferes with normal activities      - SEVERE - excruciating, unwilling or unable to urinate because of the pain      No 5. FEVER: "Do you have a fever?" If so, ask: "What is your temperature, how was it measured, and when did it start?"     No 6. ASSOCIATED SYMPTOMS: "Are you passing urine more frequently than usual?"     I don't think so 7. OTHER SYMPTOMS: "Do you have any other symptoms?" (e.g., back/flank pain, abdominal pain, vomiting)     No 8. PREGNANCY: "Is there any chance you are pregnant?" "When was your last menstrual period?"     No  Protocols used: URINE - BLOOD IN-A-AH

## 2018-05-25 ENCOUNTER — Ambulatory Visit: Payer: BLUE CROSS/BLUE SHIELD | Admitting: Physician Assistant

## 2018-05-25 ENCOUNTER — Other Ambulatory Visit: Payer: Self-pay

## 2018-05-25 ENCOUNTER — Encounter: Payer: Self-pay | Admitting: Physician Assistant

## 2018-05-25 VITALS — BP 108/62 | HR 64 | Temp 97.9°F | Resp 16 | Ht 74.0 in | Wt 173.4 lb

## 2018-05-25 DIAGNOSIS — R310 Gross hematuria: Secondary | ICD-10-CM

## 2018-05-25 LAB — POCT URINALYSIS DIPSTICK
Glucose, UA: NEGATIVE
NITRITE UA: POSITIVE
PH UA: 6.5 (ref 5.0–8.0)
PROTEIN UA: POSITIVE — AB
SPEC GRAV UA: 1.02 (ref 1.010–1.025)
UROBILINOGEN UA: 2 U/dL — AB

## 2018-05-25 LAB — POCT INR: INR: 6.3 — AB (ref 2.0–3.0)

## 2018-05-25 NOTE — Patient Instructions (Addendum)
Hold Coumadin dose tonight and tomorrow.  Take 5 mg Saturday and Sunday. Follow-up Monday for reassessment.  We are holding off on treatment for UTI until culture results as you are asymptomatic.   ER for any worsening symptoms.

## 2018-05-25 NOTE — Telephone Encounter (Unsigned)
Copied from [place] [id number]. Topic: General - Other >> May 25, 2018  2:43 PM Melissa Hayden wrote:  Pt call to ask if she can continue to take [place]   would like a call back

## 2018-05-25 NOTE — Progress Notes (Signed)
Patient presents to clinic today c/o 2 days of gross hematuria. Denies pain, urinary urgency, frequency, dysuria, nausea/vomiting, fever or chills.  Has history of atrial fibrillation and mechanical heart valve, currently on daily anticoagulation with coumadin. Is followed by Coumadin clinic in [place]. States last visit was 6-7 weeks ago. Notes she did have to go to the ER in July for similar symptoms at which time INR was noted to be > 10. She was given Vitamin K and told to hold her dose of coumadin that night. Was rechecked and noted to be at 2.6 so was told to restart regular dose and follow-up with her coumadin clinic which she has not done.  Is currently on coumadin as follows: MWF - 5 mg, TThSaSu - 7.5 mg. Is taking at nighttime. No dose today.  Past Medical History:  Diagnosis Date  . Anticoagulant long-term use    after valve surgery for Marfan's syndrome  . Anxiety   . Ascending aorta dilatation (HCC)   . Bipolar disorder ([place])   . Depression   . Marfan's syndrome    Dr. Atilano Median  . Migraine   . Mitral valve prolapse   . Palpitations   . Suicidal behavior    Tylenol Overdose  . Varicose veins     Current Outpatient Medications on File Prior to Visit  Medication Sig Dispense Refill  . acetaminophen (TYLENOL 8 HOUR ARTHRITIS PAIN) 650 MG CR tablet Take 1,300 mg by mouth every 8 (eight) hours as needed (for headache/pain relief).    . carvedilol (COREG) 6.25 MG tablet Take 1 tablet (6.25 mg total) by mouth 2 (two) times daily. 60 tablet 6  . cetirizine (ZYRTEC) 10 MG tablet TAKE 1 TABLET BY MOUTH EVERY DAY 90 tablet 0  . citalopram (CELEXA) 20 MG tablet Take 1 tablet (20 mg total) by mouth at bedtime. 90 tablet 0  . EPINEPHrine 0.3 mg/0.3 mL IJ SOAJ injection USE AS DIRECTED WHEN NEEDED  99  . hydrOXYzine (ATARAX/VISTARIL) 25 MG tablet TAKE 1 TO 2 TABLETS BY MOUTH AT BEDTIME CAUTION DROWSINESS  2  . Multiple Vitamin (MULTIVITAMIN WITH MINERALS) TABS tablet Take 1 tablet by  mouth daily. Centrum Ultra Women's Oral    . OLANZapine (ZYPREXA) 15 MG tablet Take 1 tablet (15 mg total) by mouth at bedtime. 90 tablet 1  . ondansetron (ZOFRAN-ODT) 4 MG disintegrating tablet TAKE 1 TABLET BY MOUTH EVERY 8 HOURS AS NEEDED FOR UP TO 7 DAYS  0  . triamcinolone cream (KENALOG) 0.1 %   2  . warfarin (COUMADIN) 5 MG tablet Take 1 tablet (5 mg) as directed on M, W, F. Takes 7.5 mg on other days (Patient taking differently: Take 5 mg by mouth every Monday, Wednesday, and Friday. ) 90 tablet 1  . warfarin (COUMADIN) 7.5 MG tablet Take 1 tablet (7.5 mg) on T, Th, Sa, Su. Takes 5 mg on other days. (Patient taking differently: Take 7.5 mg by mouth every Tuesday, Thursday, Saturday, and Sunday. ) 90 tablet 1  . butalbital-acetaminophen-caffeine (FIORICET, ESGIC) 50-325-40 MG tablet Take by mouth.    . Melatonin 5 MG TABS Take 15 mg by mouth at bedtime.    . nicotine (NICODERM CQ) 21 mg/24hr patch Place 1 patch (21 mg total) onto the skin daily. (Patient not taking: Reported on 05/25/2018) 28 patch 0  . nicotine (NICOTINE STEP 2) 14 mg/24hr patch Place 1 patch (14 mg total) onto the skin daily. (Patient not taking: Reported on 05/25/2018) 28 patch 0  No current facility-administered medications on file prior to visit.     No Known Allergies  Family History  Problem Relation Age of Onset  . Cancer Mother 70       Breast   . Marfan syndrome Father   . Heart disease Father   . Marfan syndrome Sister   . Marfan syndrome Paternal Grandmother   . Marfan syndrome Sister     Social History   Socioeconomic History  . Marital status: Divorced    Spouse name: Not on file  . Number of children: Not on file  . Years of education: 84  . Highest education level: Not on file  Occupational History  . Occupation: [employer]  Social Needs  . Financial resource strain: Not on file  . Food insecurity:    Worry: Not on file    Inability: Not on file  . Transportation needs:    Medical: Not on  file    Non-medical: Not on file  Tobacco Use  . Smoking status: Current Every Day Smoker    Packs/day: 2.00    Years: 18.00    Pack years: 36.00    Types: Cigarettes  . Smokeless tobacco: Current User  . Tobacco comment: Currently using e-cigarrettes to help her quit smoking   Substance and Sexual Activity  . Alcohol use: No  . Drug use: No  . Sexual activity: Not Currently  Lifestyle  . Physical activity:    Days per week: Not on file    Minutes per session: Not on file  . Stress: Not on file  Relationships  . Social connections:    Talks on phone: Not on file    Gets together: Not on file    Attends religious service: Not on file    Active member of club or organization: Not on file    Attends meetings of clubs or organizations: Not on file    Relationship status: Not on file  Other Topics Concern  . 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  Off and on    Alcohol Use:  None   Drug Use:  None   Diet:  Regular   Exercise:  None   Hobbies:  Reading, Traveling             Review of Systems - See HPI.  All other ROS are negative.  BP 108/62   Pulse 64   Temp 97.9 F (36.6 C) (Oral)   Resp 16   Ht 6\' 2"  (1.88 m)   Wt 173 lb 6.4 oz (78.7 kg)   SpO2 98%   BMI 22.26 kg/m   Physical Exam  Constitutional: She appears well-developed and well-nourished.  HENT:  Head: Normocephalic and atraumatic.  Cardiovascular: Normal rate, regular rhythm, normal heart sounds and intact distal pulses.  Pulmonary/Chest: Effort normal and breath sounds normal.  Abdominal: Soft. Bowel sounds are normal. She exhibits no distension. There is no tenderness.  Negative CVA tenderness  Psychiatric: She has a normal mood and affect.  Vitals reviewed.   Recent Results (from the past 2160 hour(s))  POCT urinalysis  dipstick     Status: Abnormal   Collection Time: 05/25/18  9:28 AM  Result Value Ref Range   Color, UA blood  Clarity, UA cloudy    Glucose, UA Negative Negative   Bilirubin, UA 2+    Ketones, UA 1+    Spec Grav, UA 1.020 1.010 - 1.025   Blood, UA 3+    pH, UA 6.5 5.0 - 8.0   Protein, UA Positive (A) Negative    Comment: 3+   Urobilinogen, UA 2.0 (A) 0.2 or 1.0 E.U./dL   Nitrite, UA positive    Leukocytes, UA Large (3+) (A) Negative   Appearance     Odor    POCT INR     Status: Abnormal   Collection Time: 05/25/18  9:39 AM  Result Value Ref Range   INR 6.3 (A) 2.0 - 3.0    Assessment/Plan: 1. Gross hematuria Urine dip with 3+ blood, LE and nitrites. No UTI symptoms presently. INR at 6.3. Likely culprit of bleeding. Will send urine culture. Will first work on INR. Will have her hold coumadin today and tomorrow, restart at 5 mg Saturday and Sunday. Follow-up in office Monday morning. Will treat for UTI if culture indicates as we want to limit antibiotic use unless necessary due to its effect on INR levels and risk for bleeding. Strict return and ER precautions reviewed with patient who voices understanding.  - POCT urinalysis dipstick - POCT INR - Urine Culture   Leeanne Rio, PA-C

## 2018-05-27 LAB — URINE CULTURE
MICRO NUMBER: 90974064
SPECIMEN QUALITY:: ADEQUATE

## 2018-05-29 ENCOUNTER — Ambulatory Visit: Payer: Self-pay | Admitting: Physician Assistant

## 2018-05-29 ENCOUNTER — Telehealth: Payer: Self-pay | Admitting: Emergency Medicine

## 2018-05-29 NOTE — Telephone Encounter (Signed)
FYI

## 2018-05-29 NOTE — Telephone Encounter (Signed)
Copied from [place] [id number]. Topic: Quick Communication - Appointment Cancellation >> May 29, 2018  7:44 AM Lennox Solders wrote: Patient called to cancel appointment scheduled for cody . Patient {HAS  rescheduled their appointment. Pt car will not start. Pt has rsc to Wednesday 05-31-18 Route to department's PEC pool.

## 2018-05-30 ENCOUNTER — Encounter: Payer: Self-pay | Admitting: Physician Assistant

## 2018-05-30 ENCOUNTER — Other Ambulatory Visit: Payer: Self-pay

## 2018-05-30 ENCOUNTER — Ambulatory Visit: Payer: BLUE CROSS/BLUE SHIELD | Admitting: Physician Assistant

## 2018-05-30 ENCOUNTER — Other Ambulatory Visit: Payer: Self-pay | Admitting: Physician Assistant

## 2018-05-30 VITALS — BP 112/73 | HR 74 | Temp 98.4°F | Resp 16 | Ht 74.0 in | Wt 176.1 lb

## 2018-05-30 DIAGNOSIS — Z7901 Long term (current) use of anticoagulants: Secondary | ICD-10-CM

## 2018-05-30 DIAGNOSIS — F3162 Bipolar disorder, current episode mixed, moderate: Secondary | ICD-10-CM

## 2018-05-30 DIAGNOSIS — R319 Hematuria, unspecified: Secondary | ICD-10-CM

## 2018-05-30 LAB — POCT URINALYSIS DIPSTICK
BILIRUBIN UA: NEGATIVE
Glucose, UA: NEGATIVE
KETONES UA: NEGATIVE
Leukocytes, UA: NEGATIVE
NITRITE UA: NEGATIVE
PH UA: 6 (ref 5.0–8.0)
PROTEIN UA: NEGATIVE
SPEC GRAV UA: 1.025 (ref 1.010–1.025)
UROBILINOGEN UA: 0.2 U/dL

## 2018-05-30 LAB — POCT INR: INR: 2.3 (ref 2.0–3.0)

## 2018-05-30 MED ORDER — WARFARIN SODIUM 7.5 MG PO TABS: ORAL_TABLET | ORAL | 1 refills | Status: DC

## 2018-05-30 MED ORDER — OLANZAPINE 15 MG PO TABS: 15.0000 mg | ORAL_TABLET | Freq: Every day | ORAL | 1 refills | Status: DC

## 2018-05-30 MED ORDER — WARFARIN SODIUM 5 MG PO TABS: ORAL_TABLET | ORAL | 1 refills | Status: DC

## 2018-05-30 MED ORDER — CITALOPRAM HYDROBROMIDE 20 MG PO TABS
20.0000 mg | ORAL_TABLET | Freq: Every day | ORAL | 0 refills | Status: DC
Start: 2018-05-30 — End: 2018-06-22

## 2018-05-30 NOTE — Patient Instructions (Addendum)
Please keep well-hydrated and get plenty of rest.   For now I want you to take your Coumadin as follows: - 5 mg daily on Sunday, Monday, Wednesday, Friday and Saturday - 7.5 mg daily on Tuesday and Thursday.  Follow-up in 1 week for repeat INR and Urine microscopy -- This is a lab appointment only.    I am glad you are better!

## 2018-05-30 NOTE — Progress Notes (Signed)
Patient presents to clinic today for follow-up of supra therapeutic INR and gross hematuria. At last visit INR was 6.3. Urine revealed large amounts of blood along with some LE and nitrites. Patient was otherwise asymptomatic. Patient was instructed to hold coumadin for 2 days, then restart at 5 mg with close follow-up. Urine culture also obtained and negative for any infection. Patient endorses following instructions as directed with complete resolution of symptoms. Denies any new or worsening symptoms.  Lab Results  Component Value Date   INR 2.3 05/30/2018   INR 6.3 (A) 05/25/2018   INR 4.4 (H) 01/09/2018     Past Medical History:  Diagnosis Date  . Anticoagulant long-term use    after valve surgery for Marfan's syndrome  . Anxiety   . Ascending aorta dilatation (HCC)   . Bipolar disorder ([place])   . Depression   . Marfan's syndrome    Dr. Atilano Median  . Migraine   . Mitral valve prolapse   . Palpitations   . Suicidal behavior    Tylenol Overdose  . Varicose veins     Current Outpatient Medications on File Prior to Visit  Medication Sig Dispense Refill  . acetaminophen (TYLENOL 8 HOUR ARTHRITIS PAIN) 650 MG CR tablet Take 1,300 mg by mouth every 8 (eight) hours as needed (for headache/pain relief).    . butalbital-acetaminophen-caffeine (FIORICET, ESGIC) 50-325-40 MG tablet Take by mouth.    . cetirizine (ZYRTEC) 10 MG tablet TAKE 1 TABLET BY MOUTH EVERY DAY 90 tablet 0  . citalopram (CELEXA) 20 MG tablet Take 1 tablet (20 mg total) by mouth at bedtime. 90 tablet 0  . EPINEPHrine 0.3 mg/0.3 mL IJ SOAJ injection USE AS DIRECTED WHEN NEEDED  99  . hydrOXYzine (ATARAX/VISTARIL) 25 MG tablet TAKE 1 TO 2 TABLETS BY MOUTH AT BEDTIME CAUTION DROWSINESS  2  . Melatonin 5 MG TABS Take 15 mg by mouth at bedtime.    . Multiple Vitamin (MULTIVITAMIN WITH MINERALS) TABS tablet Take 1 tablet by mouth daily. Centrum Ultra Women's Oral    . nicotine (NICODERM CQ) 21 mg/24hr patch Place 1 patch  (21 mg total) onto the skin daily. 28 patch 0  . nicotine (NICOTINE STEP 2) 14 mg/24hr patch Place 1 patch (14 mg total) onto the skin daily. 28 patch 0  . OLANZapine (ZYPREXA) 15 MG tablet Take 1 tablet (15 mg total) by mouth at bedtime. 90 tablet 1  . ondansetron (ZOFRAN-ODT) 4 MG disintegrating tablet TAKE 1 TABLET BY MOUTH EVERY 8 HOURS AS NEEDED FOR UP TO 7 DAYS  0  . triamcinolone cream (KENALOG) 0.1 %   2  . warfarin (COUMADIN) 5 MG tablet Take 1 tablet (5 mg) as directed on M, W, F. Takes 7.5 mg on other days (Patient taking differently: Take 5 mg by mouth every Monday, Wednesday, and Friday. ) 90 tablet 1  . warfarin (COUMADIN) 7.5 MG tablet Take 1 tablet (7.5 mg) on T, Th, Sa, Su. Takes 5 mg on other days. (Patient taking differently: Take 7.5 mg by mouth every Tuesday, Thursday, Saturday, and Sunday. ) 90 tablet 1  . carvedilol (COREG) 6.25 MG tablet Take 1 tablet (6.25 mg total) by mouth 2 (two) times daily. 60 tablet 6   No current facility-administered medications on file prior to visit.     No Known Allergies  Family History  Problem Relation Age of Onset  . Cancer Mother 75       Breast   . Marfan syndrome Father   .  Heart disease Father   . Marfan syndrome Sister   . Marfan syndrome Paternal Grandmother   . Marfan syndrome Sister     Social History   Socioeconomic History  . Marital status: Divorced    Spouse name: Not on file  . Number of children: Not on file  . Years of education: 87  . Highest education level: Not on file  Occupational History  . Occupation: [employer]  Social Needs  . Financial resource strain: Not on file  . Food insecurity:    Worry: Not on file    Inability: Not on file  . Transportation needs:    Medical: Not on file    Non-medical: Not on file  Tobacco Use  . Smoking status: Current Every Day Smoker    Packs/day: 2.00    Years: 18.00    Pack years: 36.00    Types: Cigarettes  . Smokeless tobacco: Current User  . Tobacco  comment: Currently using e-cigarrettes to help her quit smoking   Substance and Sexual Activity  . Alcohol use: No  . Drug use: No  . Sexual activity: Not Currently  Lifestyle  . Physical activity:    Days per week: Not on file    Minutes per session: Not on file  . Stress: Not on file  Relationships  . Social connections:    Talks on phone: Not on file    Gets together: Not on file    Attends religious service: Not on file    Active member of club or organization: Not on file    Attends meetings of clubs or organizations: Not on file    Relationship status: Not on file  Other Topics Concern  . 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  Off and on    Alcohol Use:  None   Drug Use:  None   Diet:  Regular   Exercise:  None   Hobbies:  Reading, Traveling             Review of Systems - See HPI.  All other ROS are negative.  BP 112/73   Pulse 74   Temp 98.4 F (36.9 C) (Oral)   Resp 16   Ht 6\' 2"  (1.88 m)   Wt 176 lb 2 oz (79.9 kg)   SpO2 98%   BMI 22.61 kg/m   Physical Exam  Constitutional: She appears well-developed and well-nourished.  HENT:  Head: Normocephalic and atraumatic.  Eyes: Conjunctivae are normal.  Cardiovascular: Normal rate, regular rhythm and normal heart sounds.  Pulmonary/Chest: Effort normal.  Psychiatric: She has a normal mood and affect.  Vitals reviewed.  Recent Results (from the past 2160 hour(s))  POCT urinalysis dipstick     Status: Abnormal   Collection Time: 05/25/18  9:28 AM  Result Value Ref Range   Color, UA blood    Clarity, UA cloudy    Glucose, UA Negative Negative   Bilirubin, UA 2+    Ketones, UA 1+    Spec Grav, UA 1.020 1.010 - 1.025   Blood, UA 3+    pH, UA 6.5 5.0 - 8.0   Protein, UA Positive (A) Negative    Comment: 3+   Urobilinogen, UA 2.0  (A) 0.2 or 1.0 E.U./dL  Nitrite, UA positive    Leukocytes, UA Large (3+) (A) Negative   Appearance     Odor    POCT INR     Status: Abnormal   Collection Time: 05/25/18  9:39 AM  Result Value Ref Range   INR 6.3 (A) 2.0 - 3.0  Urine Culture     Status: None   Collection Time: 05/25/18  9:52 AM  Result Value Ref Range   MICRO NUMBER: [id number]    SPECIMEN QUALITY: ADEQUATE    Sample Source URINE    STATUS: FINAL    Result:      Three or more organisms present, each greater than 10,000 cu/mL. May represent normal flora contamination from external genitalia. No further testing is required.    Assessment/Plan: 1. Long term (current) use of anticoagulants INR today much improved at 2.3. Asymptomatic. Will change regimen to 7.5 mg daily on Tuesday and Thursday. All other days, take 5 mg daily. Repeat INR 1 week.  - POCT INR  2. Hematuria, unspecified type Gross hematuria resolved now that INR in normal range. Some trave microscopic blood noted. Will monitor. Recheck at lab only visit in 1 week.  - POCT urinalysis dipstick   Leeanne Rio, PA-C

## 2018-05-31 ENCOUNTER — Ambulatory Visit: Payer: Self-pay | Admitting: Physician Assistant

## 2018-06-03 ENCOUNTER — Other Ambulatory Visit: Payer: Self-pay | Admitting: Physician Assistant

## 2018-06-06 ENCOUNTER — Other Ambulatory Visit: Payer: Self-pay | Admitting: Physician Assistant

## 2018-06-06 ENCOUNTER — Other Ambulatory Visit (INDEPENDENT_AMBULATORY_CARE_PROVIDER_SITE_OTHER): Payer: BLUE CROSS/BLUE SHIELD

## 2018-06-06 DIAGNOSIS — Z7901 Long term (current) use of anticoagulants: Secondary | ICD-10-CM

## 2018-06-06 DIAGNOSIS — R310 Gross hematuria: Secondary | ICD-10-CM | POA: Diagnosis not present

## 2018-06-06 LAB — URINALYSIS, MICROSCOPIC ONLY

## 2018-06-06 LAB — POCT INR: INR: 5.7 — AB (ref 2.0–3.0)

## 2018-06-07 ENCOUNTER — Telehealth: Payer: Self-pay | Admitting: Physician Assistant

## 2018-06-07 NOTE — Telephone Encounter (Signed)
Patient called, left VM to return call to the office for lab results. 

## 2018-06-07 NOTE — Telephone Encounter (Signed)
Copied from [place]. Topic: Quick Communication - Lab Results >> Jun 07, 2018  8:25 AM Anibal Henderson, CMA wrote: Called patient to inform them of lab results. When patient returns call, triage nurse may disclose results.  Urine micro shows epithelial cells so likely not the cleanest of urine catches. However there is no blood noted which is good. Continue care advice given yesterday.  Repeat INR as scheduled.

## 2018-06-12 ENCOUNTER — Other Ambulatory Visit: Payer: Self-pay | Admitting: Cardiology

## 2018-06-13 ENCOUNTER — Encounter: Payer: Self-pay | Admitting: Emergency Medicine

## 2018-06-13 ENCOUNTER — Other Ambulatory Visit (INDEPENDENT_AMBULATORY_CARE_PROVIDER_SITE_OTHER): Payer: BLUE CROSS/BLUE SHIELD | Admitting: Emergency Medicine

## 2018-06-13 DIAGNOSIS — Z7901 Long term (current) use of anticoagulants: Secondary | ICD-10-CM

## 2018-06-13 LAB — POCT INR: INR: 3.5 — AB (ref 2.0–3.0)

## 2018-06-13 NOTE — Progress Notes (Signed)
Patient presents today for POCT INR. Recently dose change to Warfarin 5 mg daily.  Today results is 3.5mg /dl Per PCP to take Warfarin 5 mg on M, T, Th,F, Sat Take 2.5 mg on Wed and Sun Will recheck in 10 days.

## 2018-06-22 ENCOUNTER — Encounter: Payer: Self-pay | Admitting: Physician Assistant

## 2018-06-22 DIAGNOSIS — Z7901 Long term (current) use of anticoagulants: Secondary | ICD-10-CM

## 2018-06-22 DIAGNOSIS — F3162 Bipolar disorder, current episode mixed, moderate: Secondary | ICD-10-CM

## 2018-06-22 MED ORDER — CITALOPRAM HYDROBROMIDE 20 MG PO TABS: 20.0000 mg | ORAL_TABLET | Freq: Every day | ORAL | 1 refills | Status: DC

## 2018-06-22 MED ORDER — WARFARIN SODIUM 5 MG PO TABS: ORAL_TABLET | ORAL | 1 refills | Status: DC

## 2018-06-22 MED ORDER — OLANZAPINE 15 MG PO TABS: 15.0000 mg | ORAL_TABLET | Freq: Every day | ORAL | 1 refills | Status: DC

## 2018-06-22 MED ORDER — WARFARIN SODIUM 7.5 MG PO TABS: ORAL_TABLET | ORAL | 1 refills | Status: DC

## 2018-06-26 MED ORDER — ESZOPICLONE 1 MG PO TABS: 1.0000 mg | ORAL_TABLET | Freq: Every evening | ORAL | 0 refills | Status: DC | PRN

## 2018-06-26 NOTE — Addendum Note (Signed)
Addended by: Brunetta Jeans on: 06/26/2018 11:16 AM   Modules accepted: Orders

## 2018-07-04 ENCOUNTER — Other Ambulatory Visit: Payer: Self-pay | Admitting: Physician Assistant

## 2018-07-05 ENCOUNTER — Telehealth: Payer: Self-pay | Admitting: Physician Assistant

## 2018-07-05 ENCOUNTER — Other Ambulatory Visit: Payer: Self-pay | Admitting: Physician Assistant

## 2018-07-05 DIAGNOSIS — F3162 Bipolar disorder, current episode mixed, moderate: Secondary | ICD-10-CM

## 2018-07-05 NOTE — Telephone Encounter (Signed)
Patient overdue for repeat INR. Has she followed up with her coumadin clinic?

## 2018-07-05 NOTE — Telephone Encounter (Signed)
LMOVM advising patient she is due for follow up appointment for recheck INR. If she is not following with Coumadin clinic. She needs to schedule an appointment

## 2018-07-06 NOTE — Telephone Encounter (Signed)
LMOVM advising patient to schedule an appointment for recheck INR

## 2018-07-11 ENCOUNTER — Encounter: Payer: BLUE CROSS/BLUE SHIELD | Admitting: *Deleted

## 2018-07-11 ENCOUNTER — Telehealth: Payer: Self-pay | Admitting: Cardiology

## 2018-07-11 NOTE — Telephone Encounter (Signed)
LMOVM reminding pt to send remote transmission.   

## 2018-07-12 ENCOUNTER — Encounter: Payer: Self-pay | Admitting: Physician Assistant

## 2018-07-13 ENCOUNTER — Encounter: Payer: Self-pay | Admitting: Cardiology

## 2018-07-19 ENCOUNTER — Ambulatory Visit: Payer: Self-pay | Admitting: Physician Assistant

## 2018-07-20 ENCOUNTER — Other Ambulatory Visit: Payer: Self-pay

## 2018-07-20 ENCOUNTER — Ambulatory Visit: Payer: BLUE CROSS/BLUE SHIELD | Admitting: Physician Assistant

## 2018-07-20 ENCOUNTER — Encounter: Payer: Self-pay | Admitting: Physician Assistant

## 2018-07-20 VITALS — BP 104/62 | HR 78 | Temp 98.2°F | Resp 14 | Ht 74.0 in | Wt 171.0 lb

## 2018-07-20 DIAGNOSIS — F5101 Primary insomnia: Secondary | ICD-10-CM

## 2018-07-20 DIAGNOSIS — Z7901 Long term (current) use of anticoagulants: Secondary | ICD-10-CM | POA: Diagnosis not present

## 2018-07-20 LAB — POCT INR: INR: 3.6 — AB (ref 2.0–3.0)

## 2018-07-20 MED ORDER — TRAZODONE HCL 100 MG PO TABS
100.0000 mg | ORAL_TABLET | Freq: Every day | ORAL | 1 refills | Status: DC
Start: 2018-07-20 — End: 2018-08-12

## 2018-07-20 NOTE — Patient Instructions (Addendum)
Please continue chronic medications with the following changes: - Increase to the new dose of the Trazadone. - Decrease Warfarin to 5 mg on Tuesday, Thursday and Saturday and 2.5 on Sunday, Monday, Wednesday and Friday.    Return for INR check here in the lab in 1 week.

## 2018-07-20 NOTE — Progress Notes (Signed)
Patient presents to clinic today for recheck of INR that is overdue. Endorses taking coumadin as directed. Is very consistent with diet. Patient also following up regarding insomnia. Was previously started on Lunesta 1 mg. Notes no improvement on medication, even with increasing the dose. States she feels anxiety is the main driver for sleep issue. Is taking her Citalopram and Zyprexa as directed. Did restart Trazodone at 50 mg which helps some with sleep but not sufficient enough per patient. Denies SI/HI.   Past Medical History:  Diagnosis Date  . Anticoagulant long-term use    after valve surgery for Marfan's syndrome  . Anxiety   . Ascending aorta dilatation (HCC)   . Bipolar disorder ([place])   . Depression   . Marfan's syndrome    Dr. Atilano Median  . Migraine   . Mitral valve prolapse   . Palpitations   . Suicidal behavior    Tylenol Overdose  . Varicose veins     Current Outpatient Medications on File Prior to Visit  Medication Sig Dispense Refill  . acetaminophen (TYLENOL 8 HOUR ARTHRITIS PAIN) 650 MG CR tablet Take 1,300 mg by mouth every 8 (eight) hours as needed (for headache/pain relief).    . carvedilol (COREG) 6.25 MG tablet TAKE 1 TABLET BY MOUTH TWICE A DAY 180 tablet 3  . cetirizine (ZYRTEC) 10 MG tablet TAKE 1 TABLET BY MOUTH EVERY DAY 90 tablet 0  . citalopram (CELEXA) 20 MG tablet Take 1 tablet (20 mg total) by mouth at bedtime. 90 tablet 1  . EPINEPHrine 0.3 mg/0.3 mL IJ SOAJ injection USE AS DIRECTED WHEN NEEDED  99  . eszopiclone (LUNESTA) 1 MG TABS tablet Take 1 tablet (1 mg total) by mouth at bedtime as needed for sleep. Take immediately before bedtime 15 tablet 0  . hydrOXYzine (ATARAX/VISTARIL) 25 MG tablet TAKE 1 TO 2 TABLETS BY MOUTH AT BEDTIME CAUTION DROWSINESS  2  . Melatonin 5 MG TABS Take 15 mg by mouth at bedtime.    . Multiple Vitamin (MULTIVITAMIN WITH MINERALS) TABS tablet Take 1 tablet by mouth daily. Centrum Ultra Women's Oral    . OLANZapine (ZYPREXA)  15 MG tablet Take 1 tablet (15 mg total) by mouth at bedtime. 90 tablet 1  . ondansetron (ZOFRAN-ODT) 4 MG disintegrating tablet TAKE 1 TABLET BY MOUTH EVERY 8 HOURS AS NEEDED FOR UP TO 7 DAYS  0  . traZODone (DESYREL) 50 MG tablet TAKE 1 TABLET (50 MG TOTAL) BY MOUTH AT BEDTIME AS NEEDED FOR SLEEP. 90 tablet 1  . triamcinolone cream (KENALOG) 0.1 %   2  . warfarin (COUMADIN) 5 MG tablet Take 1 tablet PO daily on Su,M,W,F,Sa. Takes 7.5 mg on other days. 90 tablet 1  . warfarin (COUMADIN) 7.5 MG tablet Take 1 tablet (7.5 mg) on T, Th. Takes 5 mg on other days. 90 tablet 1  . nicotine (NICODERM CQ) 21 mg/24hr patch Place 1 patch (21 mg total) onto the skin daily. (Patient not taking: Reported on 07/20/2018) 28 patch 0  . NICOTINE STEP 2 14 MG/24HR patch PLACE 1 PATCH (14 MG TOTAL) ONTO THE SKIN DAILY. (Patient not taking: Reported on 07/20/2018) 28 patch 0   No current facility-administered medications on file prior to visit.     No Known Allergies  Family History  Problem Relation Age of Onset  . Cancer Mother 67       Breast   . Marfan syndrome Father   . Heart disease Father   . Marfan  syndrome Sister   . Marfan syndrome Paternal Grandmother   . Marfan syndrome Sister     Social History   Socioeconomic History  . Marital status: Divorced    Spouse name: Not on file  . Number of children: Not on file  . Years of education: 8  . Highest education level: Not on file  Occupational History  . Occupation: [employer]  Social Needs  . Financial resource strain: Not on file  . Food insecurity:    Worry: Not on file    Inability: Not on file  . Transportation needs:    Medical: Not on file    Non-medical: Not on file  Tobacco Use  . Smoking status: Current Every Day Smoker    Packs/day: 2.00    Years: 18.00    Pack years: 36.00    Types: Cigarettes  . Smokeless tobacco: Current User  . Tobacco comment: Currently using e-cigarrettes to help her quit smoking   Substance and  Sexual Activity  . Alcohol use: No  . Drug use: No  . Sexual activity: Not Currently  Lifestyle  . Physical activity:    Days per week: Not on file    Minutes per session: Not on file  . Stress: Not on file  Relationships  . Social connections:    Talks on phone: Not on file    Gets together: Not on file    Attends religious service: Not on file    Active member of club or organization: Not on file    Attends meetings of clubs or organizations: Not on file    Relationship status: Not on file  Other Topics Concern  . 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  Off and on    Alcohol Use:  None   Drug Use:  None   Diet:  Regular   Exercise:  None   Hobbies:  Reading, Traveling             Review of Systems - See HPI.  All other ROS are negative.  BP 104/62   Pulse 78   Temp 98.2 F (36.8 C) (Oral)   Resp 14   Ht 6\' 2"  (1.88 m)   Wt 171 lb (77.6 kg)   SpO2 92%   BMI 21.96 kg/m   Physical Exam  Constitutional: She is oriented to person, place, and time. She appears well-developed and well-nourished.  HENT:  Head: Normocephalic and atraumatic.  Right Ear: External ear normal.  Left Ear: External ear normal.  Neck: Neck supple.  Cardiovascular: Normal rate, regular rhythm, normal heart sounds and intact distal pulses.  Pulmonary/Chest: Effort normal and breath sounds normal.  Neurological: She is alert and oriented to person, place, and time.  Psychiatric: She has a normal mood and affect.  Vitals reviewed.  Recent Results (from the past 2160 hour(s))  POCT urinalysis dipstick     Status: Abnormal   Collection Time: 05/25/18  9:28 AM  Result Value Ref Range   Color, UA blood    Clarity, UA cloudy    Glucose, UA Negative Negative   Bilirubin, UA 2+    Ketones, UA 1+    Spec Grav, UA 1.020  1.010 - 1.025   Blood, UA 3+  pH, UA 6.5 5.0 - 8.0   Protein, UA Positive (A) Negative    Comment: 3+   Urobilinogen, UA 2.0 (A) 0.2 or 1.0 E.U./dL   Nitrite, UA positive    Leukocytes, UA Large (3+) (A) Negative   Appearance     Odor    POCT INR     Status: Abnormal   Collection Time: 05/25/18  9:39 AM  Result Value Ref Range   INR 6.3 (A) 2.0 - 3.0  Urine Culture     Status: None   Collection Time: 05/25/18  9:52 AM  Result Value Ref Range   MICRO NUMBER: [id number]    SPECIMEN QUALITY: ADEQUATE    Sample Source URINE    STATUS: FINAL    Result:      Three or more organisms present, each greater than 10,000 cu/mL. May represent normal flora contamination from external genitalia. No further testing is required.  POCT INR     Status: Normal   Collection Time: 05/30/18  2:40 PM  Result Value Ref Range   INR 2.3 2.0 - 3.0  POCT urinalysis dipstick     Status: Abnormal   Collection Time: 05/30/18  2:54 PM  Result Value Ref Range   Color, UA yellow    Clarity, UA hazy    Glucose, UA Negative Negative   Bilirubin, UA negative    Ketones, UA negative    Spec Grav, UA 1.025 1.010 - 1.025   Blood, UA ++    pH, UA 6.0 5.0 - 8.0   Protein, UA Negative Negative   Urobilinogen, UA 0.2 0.2 or 1.0 E.U./dL   Nitrite, UA negative    Leukocytes, UA Negative Negative   Appearance     Odor    Urine Microscopic Only     Status: Abnormal   Collection Time: 06/06/18  2:32 PM  Result Value Ref Range   Mucus, UA Presence of (A) None   Squamous Epithelial / LPF Few(5-10/hpf) (A) Rare(0-4/hpf)  POCT INR     Status: Abnormal   Collection Time: 06/06/18  2:35 PM  Result Value Ref Range   INR 5.7 (A) 2.0 - 3.0  POCT INR     Status: Abnormal   Collection Time: 06/13/18  2:18 PM  Result Value Ref Range   INR 3.5 (A) 2.0 - 3.0   Assessment/Plan: 1. Chronic anticoagulation Repeat INR at 3.6. Will decrease to 2.5 mg daily 4 days per week (Sunday, Monday, Wednesday and Friday) and 5 mg  (Saturday, Tues and Thurs). Follow-up 1 week for repeat PT/INR. - POCT INR  2. Primary insomnia Increase Trazodone to 100 mg nightly. Stop Lunesta. Continue other medications as directed.    Leeanne Rio, PA-C

## 2018-07-27 ENCOUNTER — Other Ambulatory Visit (INDEPENDENT_AMBULATORY_CARE_PROVIDER_SITE_OTHER): Payer: BLUE CROSS/BLUE SHIELD

## 2018-07-27 DIAGNOSIS — Z952 Presence of prosthetic heart valve: Secondary | ICD-10-CM

## 2018-07-27 DIAGNOSIS — Z7901 Long term (current) use of anticoagulants: Secondary | ICD-10-CM | POA: Diagnosis not present

## 2018-07-27 LAB — POCT INR: INR: 3.2 — AB (ref 2.0–3.0)

## 2018-07-28 ENCOUNTER — Other Ambulatory Visit: Payer: Self-pay | Admitting: Physician Assistant

## 2018-08-12 ENCOUNTER — Other Ambulatory Visit: Payer: Self-pay | Admitting: Physician Assistant

## 2018-09-22 ENCOUNTER — Other Ambulatory Visit: Payer: Self-pay

## 2018-09-22 ENCOUNTER — Encounter: Payer: Self-pay | Admitting: Physician Assistant

## 2018-09-22 ENCOUNTER — Ambulatory Visit: Payer: BLUE CROSS/BLUE SHIELD | Admitting: Physician Assistant

## 2018-09-22 VITALS — BP 104/80 | HR 51 | Temp 97.7°F | Resp 16 | Ht 74.0 in | Wt 176.0 lb

## 2018-09-22 DIAGNOSIS — Z7901 Long term (current) use of anticoagulants: Secondary | ICD-10-CM | POA: Diagnosis not present

## 2018-09-22 DIAGNOSIS — R300 Dysuria: Secondary | ICD-10-CM

## 2018-09-22 LAB — POCT URINALYSIS DIPSTICK
BILIRUBIN UA: NEGATIVE
Glucose, UA: NEGATIVE
Ketones, UA: NEGATIVE
Leukocytes, UA: NEGATIVE
Nitrite, UA: POSITIVE
PH UA: 6 (ref 5.0–8.0)
Protein, UA: POSITIVE — AB
Spec Grav, UA: 1.02 (ref 1.010–1.025)
UROBILINOGEN UA: 1 U/dL

## 2018-09-22 LAB — POCT INR: INR: 4 — AB (ref 2.0–3.0)

## 2018-09-22 MED ORDER — CEPHALEXIN 500 MG PO CAPS: 500.0000 mg | ORAL_CAPSULE | Freq: Two times a day (BID) | ORAL | 0 refills | Status: AC

## 2018-09-22 NOTE — Progress Notes (Signed)
Patient presents to clinic today c/o 3 days of dysuria, urinary urgency and frequency with foul-smelling urine. Denies vaginal symptoms. Denies fever, chills, N/V, abdominal pain or flank pain. Denies hematuria.    Past Medical History:  Diagnosis Date  . Anticoagulant long-term use    after valve surgery for Marfan's syndrome  . Anxiety   . Ascending aorta dilatation (HCC)   . Bipolar disorder ([place])   . Depression   . Marfan's syndrome    Dr. Atilano Median  . Migraine   . Mitral valve prolapse   . Palpitations   . Suicidal behavior    Tylenol Overdose  . Varicose veins     Current Outpatient Medications on File Prior to Visit  Medication Sig Dispense Refill  . acetaminophen (TYLENOL 8 HOUR ARTHRITIS PAIN) 650 MG CR tablet Take 1,300 mg by mouth every 8 (eight) hours as needed (for headache/pain relief).    . carvedilol (COREG) 6.25 MG tablet TAKE 1 TABLET BY MOUTH TWICE A DAY 180 tablet 3  . cetirizine (ZYRTEC) 10 MG tablet TAKE 1 TABLET BY MOUTH EVERY DAY 90 tablet 0  . citalopram (CELEXA) 20 MG tablet Take 1 tablet (20 mg total) by mouth at bedtime. 90 tablet 1  . EPINEPHrine 0.3 mg/0.3 mL IJ SOAJ injection USE AS DIRECTED WHEN NEEDED  99  . hydrOXYzine (ATARAX/VISTARIL) 25 MG tablet TAKE 1 TO 2 TABLETS BY MOUTH AT BEDTIME CAUTION DROWSINESS  2  . Melatonin 5 MG TABS Take 15 mg by mouth at bedtime.    . Multiple Vitamin (MULTIVITAMIN WITH MINERALS) TABS tablet Take 1 tablet by mouth daily. Centrum Ultra Women's Oral    . OLANZapine (ZYPREXA) 15 MG tablet Take 1 tablet (15 mg total) by mouth at bedtime. 90 tablet 1  . ondansetron (ZOFRAN-ODT) 4 MG disintegrating tablet TAKE 1 TABLET BY MOUTH EVERY 8 HOURS AS NEEDED FOR UP TO 7 DAYS  0  . traZODone (DESYREL) 100 MG tablet TAKE 1 TABLET BY MOUTH EVERYDAY AT BEDTIME 90 tablet 1  . triamcinolone cream (KENALOG) 0.1 %   2  . warfarin (COUMADIN) 5 MG tablet Take 1 tablet PO daily on Su,M,W,F,Sa. Takes 7.5 mg on other days. 90 tablet 1  .  warfarin (COUMADIN) 7.5 MG tablet Take 1 tablet (7.5 mg) on T, Th. Takes 5 mg on other days. 90 tablet 1   No current facility-administered medications on file prior to visit.     No Known Allergies  Family History  Problem Relation Age of Onset  . Cancer Mother 47       Breast   . Marfan syndrome Father   . Heart disease Father   . Marfan syndrome Sister   . Marfan syndrome Paternal Grandmother   . Marfan syndrome Sister     Social History   Socioeconomic History  . Marital status: Divorced    Spouse name: Not on file  . Number of children: Not on file  . Years of education: 57  . Highest education level: Not on file  Occupational History  . Occupation: [employer]  Social Needs  . Financial resource strain: Not on file  . Food insecurity:    Worry: Not on file    Inability: Not on file  . Transportation needs:    Medical: Not on file    Non-medical: Not on file  Tobacco Use  . Smoking status: Current Every Day Smoker    Packs/day: 2.00    Years: 18.00  Pack years: 36.00    Types: Cigarettes  . Smokeless tobacco: Current User  . Tobacco comment: Currently using e-cigarrettes to help her quit smoking   Substance and Sexual Activity  . Alcohol use: No  . Drug use: No  . Sexual activity: Not Currently  Lifestyle  . Physical activity:    Days per week: Not on file    Minutes per session: Not on file  . Stress: Not on file  Relationships  . Social connections:    Talks on phone: Not on file    Gets together: Not on file    Attends religious service: Not on file    Active member of club or organization: Not on file    Attends meetings of clubs or organizations: Not on file    Relationship status: Not on file  Other Topics Concern  . 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  Off and on     Alcohol Use:  None   Drug Use:  None   Diet:  Regular   Exercise:  None   Hobbies:  Reading, Traveling             Review of Systems - See HPI.  All other ROS are negative.  BP 104/80   Pulse (!) 51   Temp 97.7 F (36.5 C) (Oral)   Resp 16   Ht 6\' 2"  (1.88 m)   Wt 176 lb (79.8 kg)   SpO2 98%   BMI 22.60 kg/m   Physical Exam Constitutional:      Appearance: Normal appearance.  HENT:     Head: Normocephalic and atraumatic.  Cardiovascular:     Rate and Rhythm: Normal rate and regular rhythm.     Pulses: Normal pulses.  Pulmonary:     Effort: Pulmonary effort is normal.  Abdominal:     General: There is no distension.     Tenderness: There is no abdominal tenderness. There is no right CVA tenderness or left CVA tenderness.  Neurological:     Mental Status: She is alert.  Psychiatric:        Mood and Affect: Mood normal.     Recent Results (from the past 2160 hour(s))  POCT INR     Status: Abnormal   Collection Time: 07/20/18  9:17 AM  Result Value Ref Range   INR 3.6 (A) 2.0 - 3.0  POCT INR     Status: Abnormal   Collection Time: 07/27/18  2:26 PM  Result Value Ref Range   INR 3.2 (A) 2.0 - 3.0  POCT Urinalysis Dipstick     Status: Abnormal   Collection Time: 09/22/18  2:33 PM  Result Value Ref Range   Color, UA amber    Clarity, UA cloudy    Glucose, UA Negative Negative   Bilirubin, UA negative    Ketones, UA negative    Spec Grav, UA 1.020 1.010 - 1.025   Blood, UA 3+    pH, UA 6.0 5.0 - 8.0   Protein, UA Positive (A) Negative   Urobilinogen, UA 1.0 0.2 or 1.0 E.U./dL   Nitrite, UA positive    Leukocytes, UA Negative Negative   Appearance     Odor    POCT INR     Status:  Abnormal   Collection Time: 09/22/18  2:42 PM  Result Value Ref Range   INR 4.0 (A) 2.0 - 3.0    Assessment/Plan: Dysuria - Plan: POCT Urinalysis Dipstick (+ nitrites and blood). Ordered Urine Culture. Will start empiric treatment for UTI with cephALEXin (KEFLEX) 500 MG  capsule. Supportive measures reviewed. Will alter regimen accordingly.  Long term (current) use of anticoagulants - Plan: POCT INR at 4.0. She has not been the most adherent with INR checks. Reviewed this with her today. Will have her hold coumadin this evening. Restart tomorrow at 5 mg daily giving addition of ABX. Repeat INR 1 week.  , Leeanne Rio, PA-C

## 2018-09-22 NOTE — Patient Instructions (Addendum)
Hold your coumadin x 1 day, then resume. Take 5 mg every day. Follow-up in 1 week for repeat PT/INR.   Your symptoms are consistent with a bladder infection, also called acute cystitis. Please take your antibiotic (Keflex) as directed until all pills are gone.  Stay very well hydrated.  Consider a daily probiotic (Align, Culturelle, or Activia) to help prevent stomach upset caused by the antibiotic.  Taking a probiotic daily may also help prevent recurrent UTIs.  Also consider taking AZO (Phenazopyridine) tablets to help decrease pain with urination.  I will call you with your urine testing results.  We will change antibiotics if indicated.  Call or return to clinic if symptoms are not resolved by completion of antibiotic.   Urinary Tract Infection A urinary tract infection (UTI) can occur any place along the urinary tract. The tract includes the kidneys, ureters, bladder, and urethra. A type of germ called bacteria often causes a UTI. UTIs are often helped with antibiotic medicine.  HOME CARE   If given, take antibiotics as told by your doctor. Finish them even if you start to feel better.  Drink enough fluids to keep your pee (urine) clear or pale yellow.  Avoid tea, drinks with caffeine, and bubbly (carbonated) drinks.  Pee often. Avoid holding your pee in for a long time.  Pee before and after having sex (intercourse).  Wipe from front to back after you poop (bowel movement) if you are a woman. Use each tissue only once. GET HELP RIGHT AWAY IF:   You have back pain.  You have lower belly (abdominal) pain.  You have chills.  You feel sick to your stomach (nauseous).  You throw up (vomit).  Your burning or discomfort with peeing does not go away.  You have a fever.  Your symptoms are not better in 3 days. MAKE SURE YOU:   Understand these instructions.  Will watch your condition.  Will get help right away if you are not doing well or get worse. Document Released:  03/15/2008 Document Revised: 06/21/2012 Document Reviewed: 04/27/2012 [facility] Patient Information 2015 [place], [place]. This information is not intended to replace advice given to you by your health care provider. Make sure you discuss any questions you have with your health care provider.

## 2018-09-24 LAB — URINE CULTURE
MICRO NUMBER:: 91495579
SPECIMEN QUALITY:: ADEQUATE

## 2018-09-27 ENCOUNTER — Other Ambulatory Visit: Payer: Self-pay | Admitting: Physician Assistant

## 2018-09-28 ENCOUNTER — Other Ambulatory Visit (INDEPENDENT_AMBULATORY_CARE_PROVIDER_SITE_OTHER): Payer: BLUE CROSS/BLUE SHIELD

## 2018-09-28 DIAGNOSIS — Z7901 Long term (current) use of anticoagulants: Secondary | ICD-10-CM | POA: Diagnosis not present

## 2018-09-28 LAB — PROTIME-INR
INR: 3.3 ratio — ABNORMAL HIGH (ref 0.8–1.0)
Prothrombin Time: 37.5 s — ABNORMAL HIGH (ref 9.6–13.1)

## 2018-09-29 ENCOUNTER — Other Ambulatory Visit: Payer: Self-pay | Admitting: *Deleted

## 2018-09-29 DIAGNOSIS — Z7901 Long term (current) use of anticoagulants: Secondary | ICD-10-CM

## 2018-10-03 ENCOUNTER — Encounter: Payer: Self-pay | Admitting: General Practice

## 2018-11-14 ENCOUNTER — Other Ambulatory Visit: Payer: Self-pay | Admitting: Physician Assistant

## 2018-11-23 ENCOUNTER — Other Ambulatory Visit: Payer: Self-pay | Admitting: Physician Assistant

## 2018-11-23 DIAGNOSIS — F3162 Bipolar disorder, current episode mixed, moderate: Secondary | ICD-10-CM

## 2018-11-24 NOTE — Telephone Encounter (Signed)
LMOVM advising patient she only received 30 days of medication. She is overdue for a follow up appointment for the PT/INR

## 2018-11-24 NOTE — Telephone Encounter (Signed)
Last OV 09/22/18 for dysuria Overdue for PT/INR  Please advise

## 2018-12-07 ENCOUNTER — Ambulatory Visit: Payer: 59 | Admitting: Physician Assistant

## 2018-12-07 ENCOUNTER — Other Ambulatory Visit: Payer: Self-pay

## 2018-12-07 ENCOUNTER — Encounter: Payer: Self-pay | Admitting: Physician Assistant

## 2018-12-07 ENCOUNTER — Encounter: Payer: Self-pay | Admitting: Emergency Medicine

## 2018-12-07 VITALS — BP 100/80 | HR 76 | Temp 98.0°F | Resp 16 | Ht 74.0 in | Wt 178.0 lb

## 2018-12-07 DIAGNOSIS — A084 Viral intestinal infection, unspecified: Secondary | ICD-10-CM | POA: Diagnosis not present

## 2018-12-07 DIAGNOSIS — S39012A Strain of muscle, fascia and tendon of lower back, initial encounter: Secondary | ICD-10-CM

## 2018-12-07 DIAGNOSIS — Z7901 Long term (current) use of anticoagulants: Secondary | ICD-10-CM | POA: Diagnosis not present

## 2018-12-07 LAB — BASIC METABOLIC PANEL
BUN: 9 mg/dL (ref 6–23)
CALCIUM: 9.1 mg/dL (ref 8.4–10.5)
CO2: 26 mEq/L (ref 19–32)
Chloride: 105 mEq/L (ref 96–112)
Creatinine, Ser: 0.77 mg/dL (ref 0.40–1.20)
GFR: 82.14 mL/min (ref >60.00)
Glucose, Bld: 83 mg/dL (ref 70–99)
Potassium: 3.8 mEq/L (ref 3.5–5.1)
Sodium: 140 mEq/L (ref 135–145)

## 2018-12-07 LAB — PROTIME-INR
INR: 1.6 ratio — ABNORMAL HIGH (ref 0.8–1.0)
Prothrombin Time: 19.1 s — ABNORMAL HIGH (ref 9.6–13.1)

## 2018-12-07 MED ORDER — ONDANSETRON HCL 4 MG/2ML IJ SOLN
4.0000 mg | Freq: Once | INTRAMUSCULAR | Status: AC
  Administered 2018-12-07: 4 mg via INTRAMUSCULAR

## 2018-12-07 MED ORDER — ONDANSETRON 4 MG PO TBDP: ORAL_TABLET | ORAL | 0 refills | Status: DC

## 2018-12-07 NOTE — Patient Instructions (Signed)
Please go to the lab today for blood work.  I will call you with your results. We will alter treatment regimen(s) if indicated by your results.   Follow the diet below. Start the sublingual zofran to help with nausea. This will help you keep things down so you can take medicine for lower back as well.  If you are unable to tolerate fluids by mouth with the medicine given now, you need to go to the ER.    Food Choices to Help Relieve Diarrhea, Adult When you have diarrhea, the foods you eat and your eating habits are very important. Choosing the right foods and drinks can help:  Relieve diarrhea.  Replace lost fluids and nutrients.  Prevent dehydration. What general guidelines should I follow?  Relieving diarrhea  Choose foods with less than 2 g or .07 oz. of fiber per serving.  Limit fats to less than 8 tsp (38 g or 1.34 oz.) a day.  Avoid the following: ? Foods and beverages sweetened with high-fructose corn syrup, honey, or sugar alcohols such as xylitol, sorbitol, and mannitol. ? Foods that contain a lot of fat or sugar. ? Fried, greasy, or spicy foods. ? High-fiber grains, breads, and cereals. ? Raw fruits and vegetables.  Eat foods that are rich in probiotics. These foods include dairy products such as yogurt and fermented milk products. They help increase healthy bacteria in the stomach and intestines (gastrointestinal tract, or GI tract).  If you have lactose intolerance, avoid dairy products. These may make your diarrhea worse.  Take medicine to help stop diarrhea (antidiarrheal medicine) only as told by your health care provider. Replacing nutrients  Eat small meals or snacks every 3-4 hours.  Eat bland foods, such as white rice, toast, or baked potato, until your diarrhea starts to get better. Gradually reintroduce nutrient-rich foods as tolerated or as told by your health care provider. This includes: ? Well-cooked protein foods. ? Peeled, seeded, and  soft-cooked fruits and vegetables. ? Low-fat dairy products.  Take vitamin and mineral supplements as told by your health care provider. Preventing dehydration  Start by sipping water or a special solution to prevent dehydration (oral rehydration solution, ORS). Urine that is clear or pale yellow means that you are getting enough fluid.  Try to drink at least 8-10 cups of fluid each day to help replace lost fluids.  You may add other liquids in addition to water, such as clear juice or decaffeinated sports drinks, as tolerated or as told by your health care provider.  Avoid drinks with caffeine, such as coffee, tea, or soft drinks.  Avoid alcohol. What foods are recommended?     The items listed may not be a complete list. Talk with your health care provider about what dietary choices are best for you. Grains White rice. White, [place], or pita breads (fresh or toasted), including plain rolls, buns, or bagels. White pasta. Saltine, soda, or graham crackers. Pretzels. Low-fiber cereal. Cooked cereals made with water (such as cornmeal, farina, or cream cereals). Plain muffins. Matzo. Melba toast. Zwieback. Vegetables Potatoes (without the skin). Most well-cooked and canned vegetables without skins or seeds. Tender lettuce. Fruits Apple sauce. Fruits canned in juice. Cooked apricots, cherries, grapefruit, peaches, pears, or plums. Fresh bananas and cantaloupe. Meats and other protein foods Baked or boiled chicken. Eggs. Tofu. Fish. Seafood. Smooth nut butters. Ground or well-cooked tender beef, ham, veal, lamb, pork, or poultry. Dairy Plain yogurt, kefir, and unsweetened liquid yogurt. Lactose-free milk, buttermilk, skim milk,  or soy milk. Low-fat or nonfat hard cheese. Beverages Water. Low-calorie sports drinks. Fruit juices without pulp. Strained tomato and vegetable juices. Decaffeinated teas. Sugar-free beverages not sweetened with sugar alcohols. Oral rehydration solutions, if  approved by your health care provider. Seasoning and other foods Bouillon, broth, or soups made from recommended foods. What foods are not recommended? The items listed may not be a complete list. Talk with your health care provider about what dietary choices are best for you. Grains Whole grain, whole wheat, bran, or rye breads, rolls, pastas, and crackers. Wild or brown rice. Whole grain or bran cereals. Barley. Oats and oatmeal. Corn tortillas or taco shells. Granola. Popcorn. Vegetables Raw vegetables. Fried vegetables. Cabbage, broccoli, Brussels sprouts, artichokes, baked beans, beet greens, corn, kale, legumes, peas, sweet potatoes, and yams. Potato skins. Cooked spinach and cabbage. Fruits Dried fruit, including raisins and dates. Raw fruits. Stewed or dried prunes. Canned fruits with syrup. Meat and other protein foods Fried or fatty meats. Deli meats. Chunky nut butters. Nuts and seeds. Beans and lentils. Berniece Salines. Hot dogs. Sausage. Dairy High-fat cheeses. Whole milk, chocolate milk, and beverages made with milk, such as milk shakes. Half-and-half. Cream. sour cream. Ice cream. Beverages Caffeinated beverages (such as coffee, tea, soda, or energy drinks). Alcoholic beverages. Fruit juices with pulp. Prune juice. Soft drinks sweetened with high-fructose corn syrup or sugar alcohols. High-calorie sports drinks. Fats and oils Butter. Cream sauces. Margarine. Salad oils. Plain salad dressings. Olives. Avocados. Mayonnaise. Sweets and desserts Sweet rolls, doughnuts, and sweet breads. Sugar-free desserts sweetened with sugar alcohols such as xylitol and sorbitol. Seasoning and other foods Honey. Hot sauce. Chili powder. Gravy. Cream-based or milk-based soups. Pancakes and waffles. Summary  When you have diarrhea, the foods you eat and your eating habits are very important.  Make sure you get at least 8-10 cups of fluid each day, or enough to keep your urine clear or pale yellow.  Eat  bland foods and gradually reintroduce healthy, nutrient-rich foods as tolerated, or as told by your health care provider.  Avoid high-fiber, fried, greasy, or spicy foods. This information is not intended to replace advice given to you by your health care provider. Make sure you discuss any questions you have with your health care provider. Document Released: 12/18/2003 Document Revised: 09/24/2016 Document Reviewed: 09/24/2016 Elsevier Interactive Patient Education  2019 [employer].

## 2018-12-07 NOTE — Progress Notes (Signed)
Patient presents to clinic today c/o 1.5 days of nausea, loose stools and non-bloody emesis. Denies chills or aches. Questionable fever. Denies melena or hematochezia. Denies any abnormal food or water source. Has been unable to keep food in. Can hydrate but only taking sips at a time.   Patient is also overdue for INR level giving chronic anticoagulation with coumadin.  Past Medical History:  Diagnosis Date  . Anticoagulant long-term use    after valve surgery for Marfan's syndrome  . Anxiety   . Ascending aorta dilatation (HCC)   . Bipolar disorder ([place])   . Depression   . Marfan's syndrome    Dr. Atilano Median  . Migraine   . Mitral valve prolapse   . Palpitations   . Suicidal behavior    Tylenol Overdose  . Varicose veins     Current Outpatient Medications on File Prior to Visit  Medication Sig Dispense Refill  . acetaminophen (TYLENOL 8 HOUR ARTHRITIS PAIN) 650 MG CR tablet Take 1,300 mg by mouth every 8 (eight) hours as needed (for headache/pain relief).    . carvedilol (COREG) 6.25 MG tablet TAKE 1 TABLET BY MOUTH TWICE A DAY 180 tablet 3  . cetirizine (ZYRTEC) 10 MG tablet TAKE 1 TABLET BY MOUTH EVERY DAY 90 tablet 2  . citalopram (CELEXA) 20 MG tablet Take 1 tablet (20 mg total) by mouth at bedtime. 90 tablet 1  . EPINEPHrine 0.3 mg/0.3 mL IJ SOAJ injection USE AS DIRECTED WHEN NEEDED  99  . hydrOXYzine (ATARAX/VISTARIL) 25 MG tablet TAKE 1 TO 2 TABLETS BY MOUTH AT BEDTIME CAUTION DROWSINESS  2  . Melatonin 5 MG TABS Take 15 mg by mouth at bedtime.    . Multiple Vitamin (MULTIVITAMIN WITH MINERALS) TABS tablet Take 1 tablet by mouth daily. Centrum Ultra Women's Oral    . OLANZapine (ZYPREXA) 15 MG tablet TAKE 1 TABLET BY MOUTH EVERYDAY AT BEDTIME 30 tablet 0  . traZODone (DESYREL) 100 MG tablet TAKE 1 TABLET BY MOUTH EVERYDAY AT BEDTIME 90 tablet 1  . triamcinolone cream (KENALOG) 0.1 %   2  . warfarin (COUMADIN) 5 MG tablet Take 1 tablet PO daily on Su,M,W,F,Sa. Takes 7.5  mg on other days. 90 tablet 1  . warfarin (COUMADIN) 7.5 MG tablet Take 1 tablet (7.5 mg) on T, Th. Takes 5 mg on other days. 90 tablet 1   No current facility-administered medications on file prior to visit.     No Known Allergies  Family History  Problem Relation Age of Onset  . Cancer Mother 88       Breast   . Marfan syndrome Father   . Heart disease Father   . Marfan syndrome Sister   . Marfan syndrome Paternal Grandmother   . Marfan syndrome Sister     Social History   Socioeconomic History  . Marital status: Divorced    Spouse name: Not on file  . Number of children: Not on file  . Years of education: 85  . Highest education level: Not on file  Occupational History  . Occupation: [employer]  Social Needs  . Financial resource strain: Not on file  . Food insecurity:    Worry: Not on file    Inability: Not on file  . Transportation needs:    Medical: Not on file    Non-medical: Not on file  Tobacco Use  . Smoking status: Current Every Day Smoker    Packs/day: 2.00    Years: 18.00  Pack years: 36.00    Types: Cigarettes  . Smokeless tobacco: Current User  . Tobacco comment: Currently using e-cigarrettes to help her quit smoking   Substance and Sexual Activity  . Alcohol use: No  . Drug use: No  . Sexual activity: Not Currently  Lifestyle  . Physical activity:    Days per week: Not on file    Minutes per session: Not on file  . Stress: Not on file  Relationships  . Social connections:    Talks on phone: Not on file    Gets together: Not on file    Attends religious service: Not on file    Active member of club or organization: Not on file    Attends meetings of clubs or organizations: Not on file    Relationship status: Not on file  Other Topics Concern  . 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  Off and on    Alcohol Use:  None   Drug Use:  None   Diet:  Regular   Exercise:  None   Hobbies:  Reading, Traveling              Review of Systems - See HPI.  All other ROS are negative.  BP 100/80   Pulse 76   Temp 98 F (36.7 C) (Oral)   Resp 16   Ht 6\' 2"  (1.88 m)   Wt 178 lb (80.7 kg)   SpO2 99%   BMI 22.85 kg/m   Physical Exam  Recent Results (from the past 2160 hour(s))  POCT Urinalysis Dipstick     Status: Abnormal   Collection Time: 09/22/18  2:33 PM  Result Value Ref Range   Color, UA amber    Clarity, UA cloudy    Glucose, UA Negative Negative   Bilirubin, UA negative    Ketones, UA negative    Spec Grav, UA 1.020 1.010 - 1.025   Blood, UA 3+    pH, UA 6.0 5.0 - 8.0   Protein, UA Positive (A) Negative   Urobilinogen, UA 1.0 0.2 or 1.0 E.U./dL   Nitrite, UA positive    Leukocytes, UA Negative Negative   Appearance     Odor    Urine Culture     Status: Abnormal   Collection Time: 09/22/18  2:37 PM  Result Value Ref Range   MICRO NUMBER: [id number]    SPECIMEN QUALITY: Adequate    Sample Source URINE    STATUS: FINAL    ISOLATE 1: Escherichia coli (A)     Comment: Greater than 100,000 CFU/mL of Escherichia coli      Susceptibility   Escherichia coli - URINE CULTURE, REFLEX    AMOX/CLAVULANIC 8 Sensitive     AMPICILLIN 8 Sensitive     AMPICILLIN/SULBACTAM 4 Sensitive     CEFAZOLIN* <=4 Not Reportable      * For infections other than uncomplicated UTIcaused by E. coli, K. pneumoniae or P. mirabilis:Cefazolin is resistant if MIC > or = 8 mcg/mL.(Distinguishing susceptible versus intermediatefor isolates with MIC < or = 4 mcg/mL requiresadditional testing.)For uncomplicated UTI caused by E. coli,K. pneumoniae or P. mirabilis: Cefazolin issusceptible if MIC <32 mcg/mL and predictssusceptible to the oral agents cefaclor, cefdinir,cefpodoxime, cefprozil, cefuroxime, cephalexinand loracarbef.  CEFEPIME <=1 Sensitive     CEFTRIAXONE <=1  Sensitive     CIPROFLOXACIN <=0.25 Sensitive     LEVOFLOXACIN <=0.12 Sensitive     ERTAPENEM <=0.5 Sensitive     GENTAMICIN <=1 Sensitive     IMIPENEM <=0.25 Sensitive     NITROFURANTOIN 64 Intermediate     PIP/TAZO <=4 Sensitive     TOBRAMYCIN <=1 Sensitive     TRIMETH/SULFA* <=20 Sensitive      * For infections other than uncomplicated UTIcaused by E. coli, K. pneumoniae or P. mirabilis:Cefazolin is resistant if MIC > or = 8 mcg/mL.(Distinguishing susceptible versus intermediatefor isolates with MIC < or = 4 mcg/mL requiresadditional testing.)For uncomplicated UTI caused by E. coli,K. pneumoniae or P. mirabilis: Cefazolin issusceptible if MIC <32 mcg/mL and predictssusceptible to the oral agents cefaclor, cefdinir,cefpodoxime, cefprozil, cefuroxime, cephalexinand loracarbef.Legend:S = Susceptible  I = IntermediateR = Resistant  NS = Not susceptible* = Not tested  NR = Not reported**NN = See antimicrobic comments  POCT INR     Status: Abnormal   Collection Time: 09/22/18  2:42 PM  Result Value Ref Range   INR 4.0 (A) 2.0 - 3.0  Protime-INR     Status: Abnormal   Collection Time: 09/28/18  2:33 PM  Result Value Ref Range   INR 3.3 (H) 0.8 - 1.0 ratio   Prothrombin Time 37.5 (H) 9.6 - 13.1 sec    Assessment/Plan: 1. Long term (current) use of anticoagulants - Protime-INR ( [facility] ONLY)  2. Viral gastroenteritis Etiology seems viral. No sign of bacterial infection. No alarm signs/symptoms. PO hydration key. Discussed this with patient. IM Zofran given to help with nausea. Rx Zofran SL sent in. BRAT diet reviewed. Will check BMP today.  - Basic metabolic panel - ondansetron (ZOFRAN-ODT) 4 MG disintegrating tablet; TAKE 1 TABLET BY MOUTH EVERY 8 HOURS AS NEEDED FOR UP TO 7 DAYS  Dispense: 20 tablet; Refill: 0 - ondansetron (ZOFRAN) injection 4 mg  3. Strain of lumbar region, initial encounter 2/2 vomiting. Supportive measures and OTC medications reviewed. Should calm down once retching  has ceased.  Strict ER precautions reviewed with patient.    Leeanne Rio, PA-C

## 2018-12-11 ENCOUNTER — Other Ambulatory Visit: Payer: Self-pay | Admitting: Physician Assistant

## 2018-12-12 ENCOUNTER — Ambulatory Visit: Payer: Self-pay

## 2018-12-12 ENCOUNTER — Other Ambulatory Visit: Payer: 59

## 2018-12-13 ENCOUNTER — Other Ambulatory Visit (INDEPENDENT_AMBULATORY_CARE_PROVIDER_SITE_OTHER): Payer: 59

## 2018-12-13 ENCOUNTER — Encounter: Payer: Self-pay | Admitting: Physician Assistant

## 2018-12-13 DIAGNOSIS — Z7901 Long term (current) use of anticoagulants: Secondary | ICD-10-CM

## 2018-12-13 LAB — PROTIME-INR
INR: 3.8 ratio — ABNORMAL HIGH (ref 0.8–1.0)
Prothrombin Time: 43 s — ABNORMAL HIGH (ref 9.6–13.1)

## 2018-12-17 ENCOUNTER — Other Ambulatory Visit: Payer: Self-pay | Admitting: Physician Assistant

## 2018-12-17 DIAGNOSIS — F3162 Bipolar disorder, current episode mixed, moderate: Secondary | ICD-10-CM

## 2018-12-26 ENCOUNTER — Encounter: Payer: Self-pay | Admitting: Physician Assistant

## 2018-12-26 DIAGNOSIS — F3162 Bipolar disorder, current episode mixed, moderate: Secondary | ICD-10-CM

## 2018-12-27 MED ORDER — OLANZAPINE 15 MG PO TABS: ORAL_TABLET | ORAL | 0 refills | Status: DC

## 2019-01-04 ENCOUNTER — Other Ambulatory Visit: Payer: Self-pay | Admitting: Physician Assistant

## 2019-01-04 DIAGNOSIS — F3162 Bipolar disorder, current episode mixed, moderate: Secondary | ICD-10-CM

## 2019-01-30 ENCOUNTER — Other Ambulatory Visit: Payer: Self-pay | Admitting: Physician Assistant

## 2019-01-30 DIAGNOSIS — F3162 Bipolar disorder, current episode mixed, moderate: Secondary | ICD-10-CM

## 2019-01-30 NOTE — Telephone Encounter (Signed)
LMOVM advising patient to call back to schedule a lab visit for POCT INR check.

## 2019-01-30 NOTE — Telephone Encounter (Signed)
Patient is due for 4 week follow up for Coumadin check. We can schedule a lab visit for POCT INR check in office to make any adjustments to medication.

## 2019-02-15 ENCOUNTER — Encounter: Payer: Self-pay | Admitting: Physician Assistant

## 2019-02-16 ENCOUNTER — Other Ambulatory Visit: Payer: Self-pay | Admitting: Physician Assistant

## 2019-02-16 DIAGNOSIS — F3162 Bipolar disorder, current episode mixed, moderate: Secondary | ICD-10-CM

## 2019-02-16 MED ORDER — OLANZAPINE 15 MG PO TABS: ORAL_TABLET | ORAL | 0 refills | Status: DC

## 2019-02-16 MED ORDER — CETIRIZINE HCL 10 MG PO TABS: 10.0000 mg | ORAL_TABLET | Freq: Every day | ORAL | 2 refills | Status: DC

## 2019-02-17 ENCOUNTER — Other Ambulatory Visit: Payer: Self-pay | Admitting: Physician Assistant

## 2019-02-21 ENCOUNTER — Other Ambulatory Visit (INDEPENDENT_AMBULATORY_CARE_PROVIDER_SITE_OTHER): Payer: BLUE CROSS/BLUE SHIELD

## 2019-02-21 DIAGNOSIS — Z7901 Long term (current) use of anticoagulants: Secondary | ICD-10-CM | POA: Diagnosis not present

## 2019-02-21 LAB — POCT INR: INR: 3.1 — AB (ref 2.0–3.0)

## 2019-03-11 ENCOUNTER — Other Ambulatory Visit: Payer: Self-pay | Admitting: Physician Assistant

## 2019-03-11 DIAGNOSIS — Z7901 Long term (current) use of anticoagulants: Secondary | ICD-10-CM

## 2019-03-19 ENCOUNTER — Encounter: Payer: Self-pay | Admitting: Physician Assistant

## 2019-03-19 DIAGNOSIS — F3162 Bipolar disorder, current episode mixed, moderate: Secondary | ICD-10-CM

## 2019-03-20 MED ORDER — OLANZAPINE 15 MG PO TABS: ORAL_TABLET | ORAL | 3 refills | Status: DC

## 2019-04-11 ENCOUNTER — Other Ambulatory Visit: Payer: Self-pay | Admitting: Physician Assistant

## 2019-04-11 DIAGNOSIS — F3162 Bipolar disorder, current episode mixed, moderate: Secondary | ICD-10-CM

## 2019-04-16 ENCOUNTER — Telehealth: Payer: Self-pay | Admitting: *Deleted

## 2019-04-16 NOTE — Telephone Encounter (Signed)
A message was left, re: follow up visit. 

## 2019-06-14 ENCOUNTER — Other Ambulatory Visit: Payer: Self-pay | Admitting: Physician Assistant

## 2019-07-10 ENCOUNTER — Other Ambulatory Visit: Payer: Self-pay | Admitting: Physician Assistant

## 2019-08-09 ENCOUNTER — Encounter: Payer: Self-pay | Admitting: Emergency Medicine

## 2019-08-09 ENCOUNTER — Other Ambulatory Visit: Payer: Self-pay | Admitting: Physician Assistant

## 2019-08-16 ENCOUNTER — Other Ambulatory Visit: Payer: Self-pay | Admitting: Physician Assistant

## 2019-09-24 ENCOUNTER — Encounter: Payer: Self-pay | Admitting: Physician Assistant

## 2019-09-24 ENCOUNTER — Ambulatory Visit (INDEPENDENT_AMBULATORY_CARE_PROVIDER_SITE_OTHER): Payer: BC Managed Care – PPO | Admitting: Physician Assistant

## 2019-09-24 ENCOUNTER — Other Ambulatory Visit: Payer: Self-pay

## 2019-09-24 DIAGNOSIS — Z7901 Long term (current) use of anticoagulants: Secondary | ICD-10-CM

## 2019-09-24 DIAGNOSIS — F3162 Bipolar disorder, current episode mixed, moderate: Secondary | ICD-10-CM | POA: Diagnosis not present

## 2019-09-24 MED ORDER — OLANZAPINE 15 MG PO TABS: ORAL_TABLET | ORAL | 3 refills | Status: DC

## 2019-09-24 MED ORDER — WARFARIN SODIUM 5 MG PO TABS: ORAL_TABLET | ORAL | 1 refills | Status: DC

## 2019-09-24 MED ORDER — WARFARIN SODIUM 7.5 MG PO TABS: ORAL_TABLET | ORAL | 1 refills | Status: DC

## 2019-09-24 MED ORDER — CITALOPRAM HYDROBROMIDE 20 MG PO TABS: 20.0000 mg | ORAL_TABLET | Freq: Every day | ORAL | 3 refills | Status: DC

## 2019-09-24 NOTE — Progress Notes (Signed)
Virtual Visit via Video   I connected with patient on 09/24/19 at 10:30 AM EST by a video enabled telemedicine application and verified that I am speaking with the correct person using two identifiers.  Location patient: Home Location provider: [facility], Office Persons participating in the virtual visit: Patient, Provider, [place] (Melissa Hayden)  I discussed the limitations of evaluation and management by telemedicine and the availability of in person appointments. The patient expressed understanding and agreed to proceed.  Subjective:   HPI:   Patient presents via [facility] today complaining of worsening of bipolar depression over the past couple of months.  Endorses significant increase in depressed mood, feelings of sadness, lack of motivation, fatigue.  Notes increased irritability.  Also notes some increased anxiety levels with racing thoughts and inattention.  Notes this is been going on for a few months.  Has been working a new job that she started in April.  Notes this is one of the easiest job she has ever had and does not understand why she is feeling so anxious.  Notes that she was sleep over the past couple of weeks.  Notes sleeping about 4 hours per night.  Mainly having issue with sleep onset.  This is despite taking her trazodone nightly.  Denies significant change in diet other than decrease in overall intake.  Denies nausea or vomiting.  Denies abdominal pain or change to bowel or bladder habits.  Does note significant increase in caffeine levels.  Patient denies suicidal thought or ideation.  Patient previously on a regimen of Zyprexa 15 mg daily, citalopram 20 mg daily, trazodone 100 mg nightly.  Has been out of her Zyprexa for a few weeks.  Has been out of citalopram for almost 2 months.   ROS:   See pertinent positives and negatives per HPI.  Patient Active Problem List   Diagnosis Date Noted  . Syncope and collapse 01/05/2018  . Chronic systolic (congestive)  heart failure ([place]) 01/05/2018  . Nonischemic cardiomyopathy ([place]) 11/16/2017  . Irregular cardiac rhythm 11/07/2017  . Visit for preventive health examination 11/07/2017  . Breast cancer screening 11/07/2017  . History of syncope 11/03/2017  . Multifocal PVCs with pairing 11/03/2017  . Long term (current) use of anticoagulants 10/03/2017  . History of aortic stenosis 03/18/2017  . Suicide attempt by drug ingestion ([place]) 03/18/2017  . Insomnia 12/12/2014  . Marfan's disease 06/13/2014  . Chronic venous insufficiency 06/13/2014  . Left leg pain 06/13/2014  . Spinal cord cysts 06/13/2014  . Bipolar disorder, mixed ([place]) 02/10/2014    Social History   Tobacco Use  . Smoking status: Current Every Day Smoker    Packs/day: 2.00    Years: 18.00    Pack years: 36.00    Types: Cigarettes  . Smokeless tobacco: Current User  Substance Use Topics  . Alcohol use: No    Current Outpatient Medications:  .  acetaminophen (TYLENOL 8 HOUR ARTHRITIS PAIN) 650 MG CR tablet, Take 1,300 mg by mouth every 8 (eight) hours as needed (for headache/pain relief)., Disp: , Rfl:  .  carvedilol (COREG) 6.25 MG tablet, TAKE 1 TABLET BY MOUTH TWICE A DAY, Disp: 180 tablet, Rfl: 3 .  cetirizine (ZYRTEC) 10 MG tablet, Take 1 tablet (10 mg total) by mouth daily., Disp: 90 tablet, Rfl: 2 .  citalopram (CELEXA) 20 MG tablet, Take 1 tablet (20 mg total) by mouth daily. No more refills until appointment. Call to schedule [phone], Disp: 30 tablet, Rfl: 0 .  EPINEPHrine  0.3 mg/0.3 mL IJ SOAJ injection, USE AS DIRECTED WHEN NEEDED, Disp: , Rfl: 99 .  hydrOXYzine (ATARAX/VISTARIL) 25 MG tablet, TAKE 1 TO 2 TABLETS BY MOUTH AT BEDTIME CAUTION DROWSINESS, Disp: , Rfl: 2 .  Melatonin 5 MG TABS, Take 15 mg by mouth at bedtime., Disp: , Rfl:  .  Multiple Vitamin (MULTIVITAMIN WITH MINERALS) TABS tablet, Take 1 tablet by mouth daily. Centrum Ultra Women's Oral, Disp: , Rfl:  .  OLANZapine (ZYPREXA) 15 MG tablet, TAKE 1  TABLET BY MOUTH EVERYDAY AT BEDTIME, Disp: 30 tablet, Rfl: 3 .  ondansetron (ZOFRAN-ODT) 4 MG disintegrating tablet, TAKE 1 TABLET BY MOUTH EVERY 8 HOURS AS NEEDED FOR UP TO 7 DAYS, Disp: 20 tablet, Rfl: 0 .  traZODone (DESYREL) 100 MG tablet, Take 1 tablet (100 mg total) by mouth at bedtime. No more refills until appointment, Disp: 30 tablet, Rfl: 0 .  triamcinolone cream (KENALOG) 0.1 %, , Disp: , Rfl: 2 .  warfarin (COUMADIN) 7.5 MG tablet, Take 1 tablet (7.5 mg) on T, Th. Takes 5 mg on other days., Disp: 90 tablet, Rfl: 1 .  warfarin (COUMADIN) 5 MG tablet, TAKE 1 TABLET DAILY ON SUN,MON,WED,FRI,SAT. TAKES 7.5 MG ON OTHER DAYS. (Patient not taking: Reported on 09/24/2019), Disp: 90 tablet, Rfl: 1  No Known Allergies  Objective:   There were no vitals taken for this visit.  Patient is well-developed, well-nourished in no acute distress.  Resting comfortably at home.  Head is normocephalic, atraumatic.  No labored breathing.  Speech is clear and coherent with logical content.  Patient is alert and oriented at baseline.   Assessment and Plan:   1. Bipolar 1 disorder, mixed, moderate (HCC) Suspect a lot of the deterioration in her mood is related to running out of medications, effectively stopping them cold [place].  Multiple attempts been made to get patient in for follow-up to continue her medicines but she had not scheduled appointment.  We will restart her on her olanzapine at 15 mg once daily.  We will also start citalopram but have her take 10 mg once daily for week before increasing back to 20 mg daily.  Continue trazodone.  Sleep hygiene practices reviewed.  OTC medications reviewed.  We will follow-up via video in office in a couple of weeks to see how things are going and to make further changes.  Strict return precautions reviewed with patient. - OLANZapine (ZYPREXA) 15 MG tablet; TAKE 1 TABLET BY MOUTH EVERYDAY AT BEDTIME  Dispense: 30 tablet; Refill: 3  2. Chronic  anticoagulation History of noncompliance with INR checks.  Has been out of her 5 mg dose of Coumadin for a few weeks.  Has been trying to cut down her 7.5 mg tablet to make 5 mg.  As such she clearly is not getting consistent dosing.  She has been scheduled for INR check next week in office.  Refilled both doses of her Coumadin so she can get back on consistent dosing.  Will alter regimen according to findings.  Discussed again importance of consistent follow-up for INR checks.  Will not be able to continue prescribing this medication for patient if she cannot be adherent with follow-up. - warfarin (COUMADIN) 5 MG tablet; TAKE 1 TABLET DAILY ON SUN,MON,WED,FRI,SAT. TAKES 7.5 MG ON OTHER DAYS.  Dispense: 90 tablet; Refill: 1 - warfarin (COUMADIN) 7.5 MG tablet; Take 1 tablet (7.5 mg) on T, Th. Takes 5 mg on other days.  Dispense: 90 tablet; Refill: 1    Gwyndolyn Saxon  Elyn Aquas, PA-C 09/24/2019

## 2019-09-24 NOTE — Patient Instructions (Signed)
Instructions sent to MyChart.  Please keep well-hydrated. Restart the Zyprexa once daily as directed. Restart the citalopram, taking 1/2 tablet once daily for 1 week.  Then increase to 1 tablet daily. Continue the trazodone nightly.  Can start OTC Pure ZZZs as well.  Please follow the sleep hygiene practices below. We will follow-up in 3 to 4 weeks via video visit for mood. Sooner if needed.  Please restart the Coumadin taking both doses as directed.  New prescriptions have been sent to your pharmacy.  Follow-up next Monday as scheduled for INR check.  We will make further changes at that time.  Sleep Hygiene  Do: (1) Go to bed at the same time each day. (2) Get up from bed at the same time each day. (3) Get regular exercise each day, preferably in the morning.  There is goof evidence that regular exercise improves restful sleep.  This includes stretching and aerobic exercise. (4) Get regular exposure to outdoor or bright lights, especially in the late afternoon. (5) Keep the temperature in your bedroom comfortable. (6) Keep the bedroom quiet when sleeping. (7) Keep the bedroom dark enough to facilitate sleep. (8) Use your bed only for sleep and sex. (9) Take medications as directed.  It is helpful to take prescribed sleeping pills 1 hour before bedtime, so they are causing drowsiness when you lie down, or 10 hours before getting up, to avoid daytime drowsiness. (10) Use a relaxation exercise just before going to sleep -- imagery, massage, warm bath. (11) Keep your feet and hands warm.  Wear warm socks and/or mittens or gloves to bed.  Don't: (1) Exercise just before going to bed. (2) Engage in stimulating activity just before bed, such as playing a competitive game, watching an exciting program on television, or having an important discussion with a loved one. (3) Have caffeine in the evening (coffee, teas, chocolate, sodas, etc.) (4) Read or watch television in bed. (5) Use alcohol  to help you sleep. (6) Go to bed too hungry or too full. (7) Take another person's sleeping pills. (8) Take over-the-counter sleeping pills, without your doctor's knowledge.  Tolerance can develop rapidly with these medications.  Diphenhydramine can have serious side effects for elderly patients. (9) Take daytime naps. (10) Command yourself to go to sleep.  This only makes your mind and body more alert.  If you lie awake for more than 20-30 minutes, get up, go to a different room, participate in a quiet activity (Ex - non-excitable reading or television), and then return to bed when you feel sleepy.  Do this as many times during the night as needed.  This may cause you to have a night or two of poor sleep but it will train your brain to know when it is time for sleep.

## 2019-09-24 NOTE — Progress Notes (Signed)
I have discussed the procedure for the virtual visit with the patient who has given consent to proceed with assessment and treatment.   Armani Brar S Cris Talavera, CMA     

## 2019-09-27 ENCOUNTER — Other Ambulatory Visit: Payer: Self-pay | Admitting: Physician Assistant

## 2019-10-01 ENCOUNTER — Ambulatory Visit (INDEPENDENT_AMBULATORY_CARE_PROVIDER_SITE_OTHER): Payer: BC Managed Care – PPO

## 2019-10-01 ENCOUNTER — Other Ambulatory Visit: Payer: Self-pay

## 2019-10-01 DIAGNOSIS — Z7901 Long term (current) use of anticoagulants: Secondary | ICD-10-CM

## 2019-10-01 LAB — PROTIME-INR
INR: 4.5 ratio — ABNORMAL HIGH (ref 0.8–1.0)
Prothrombin Time: 51.3 s — ABNORMAL HIGH (ref 9.6–13.1)

## 2019-10-08 ENCOUNTER — Encounter: Payer: Self-pay | Admitting: Physician Assistant

## 2019-10-09 ENCOUNTER — Encounter: Payer: Self-pay | Admitting: Emergency Medicine

## 2019-10-16 ENCOUNTER — Other Ambulatory Visit: Payer: Self-pay | Admitting: Physician Assistant

## 2019-10-16 DIAGNOSIS — F3162 Bipolar disorder, current episode mixed, moderate: Secondary | ICD-10-CM

## 2019-10-18 ENCOUNTER — Other Ambulatory Visit (INDEPENDENT_AMBULATORY_CARE_PROVIDER_SITE_OTHER): Payer: BC Managed Care – PPO

## 2019-10-18 ENCOUNTER — Ambulatory Visit: Payer: BC Managed Care – PPO

## 2019-10-18 DIAGNOSIS — Z7901 Long term (current) use of anticoagulants: Secondary | ICD-10-CM

## 2019-10-18 LAB — PROTIME-INR
INR: 2 ratio — ABNORMAL HIGH (ref 0.8–1.0)
Prothrombin Time: 23.3 s — ABNORMAL HIGH (ref 9.6–13.1)

## 2019-10-18 NOTE — Addendum Note (Signed)
Addended by: Francis Dowse T on: 10/18/2019 02:46 PM   Modules accepted: Orders

## 2019-10-19 ENCOUNTER — Encounter: Payer: Self-pay | Admitting: Physician Assistant

## 2019-12-06 ENCOUNTER — Other Ambulatory Visit: Payer: Self-pay | Admitting: Physician Assistant

## 2019-12-06 DIAGNOSIS — H66002 Acute suppurative otitis media without spontaneous rupture of ear drum, left ear: Secondary | ICD-10-CM | POA: Diagnosis not present

## 2019-12-06 DIAGNOSIS — Z03818 Encounter for observation for suspected exposure to other biological agents ruled out: Secondary | ICD-10-CM | POA: Diagnosis not present

## 2019-12-06 DIAGNOSIS — F3162 Bipolar disorder, current episode mixed, moderate: Secondary | ICD-10-CM

## 2019-12-06 DIAGNOSIS — Z20828 Contact with and (suspected) exposure to other viral communicable diseases: Secondary | ICD-10-CM | POA: Diagnosis not present

## 2019-12-07 NOTE — Telephone Encounter (Signed)
Zyprexa last rx 10/16/2019 #90 LOV: 09/24/19 Bipolar disorder

## 2019-12-26 ENCOUNTER — Encounter: Payer: Self-pay | Admitting: Physician Assistant

## 2019-12-31 ENCOUNTER — Other Ambulatory Visit: Payer: Self-pay | Admitting: Emergency Medicine

## 2019-12-31 MED ORDER — TRAZODONE HCL 100 MG PO TABS: 100.0000 mg | ORAL_TABLET | Freq: Every day | ORAL | 0 refills | Status: DC

## 2020-01-07 ENCOUNTER — Ambulatory Visit: Payer: BC Managed Care – PPO | Admitting: Physician Assistant

## 2020-01-08 ENCOUNTER — Other Ambulatory Visit: Payer: Self-pay | Admitting: Physician Assistant

## 2020-01-08 DIAGNOSIS — F3162 Bipolar disorder, current episode mixed, moderate: Secondary | ICD-10-CM

## 2020-01-10 ENCOUNTER — Encounter: Payer: Self-pay | Admitting: Physician Assistant

## 2020-01-10 ENCOUNTER — Telehealth (INDEPENDENT_AMBULATORY_CARE_PROVIDER_SITE_OTHER): Payer: BC Managed Care – PPO | Admitting: Physician Assistant

## 2020-01-10 ENCOUNTER — Other Ambulatory Visit: Payer: Self-pay

## 2020-01-10 DIAGNOSIS — Z7901 Long term (current) use of anticoagulants: Secondary | ICD-10-CM | POA: Diagnosis not present

## 2020-01-10 DIAGNOSIS — F3160 Bipolar disorder, current episode mixed, unspecified: Secondary | ICD-10-CM

## 2020-01-10 MED ORDER — OLANZAPINE 20 MG PO TABS: 20.0000 mg | ORAL_TABLET | Freq: Every day | ORAL | 3 refills | Status: DC

## 2020-01-10 NOTE — Progress Notes (Signed)
I have discussed the procedure for the virtual visit with the patient who has given consent to proceed with assessment and treatment.   Alyssha Housh S Yatziri Wainwright, CMA     

## 2020-01-10 NOTE — Progress Notes (Signed)
Virtual Visit via Video   I connected with patient on 01/10/20 at 10:00 AM EDT by a video enabled telemedicine application and verified that I am speaking with the correct person using two identifiers.  Location patient: Home Location provider: [facility], Office Persons participating in the virtual visit: Patient, Provider, [place] (Patina Moore)  I discussed the limitations of evaluation and management by telemedicine and the availability of in person appointments. The patient expressed understanding and agreed to proceed.  Subjective:   HPI:   Patient presents via [place] today to follow-up for Bipolar Depression and insomnia. Patient currently on a regimen of Citalopram 20 mg QD, Olanzapine 15 mg QD and Trazodone 100 mg QHS. Has been taking medications as directed. Has been doing very well for some time. Notes in the past few weeks she is having breakthrough depression, anxiety and anhedonia. Denies any SI/HI. Denies manic episodes. In the past month has taken on new job responsibilities and has had a hard time with them. Sleep has also been affected.  ROS:   See pertinent positives and negatives per HPI.  Patient Active Problem List   Diagnosis Date Noted  . Syncope and collapse 01/05/2018  . Chronic systolic (congestive) heart failure ([place]) 01/05/2018  . Nonischemic cardiomyopathy ([place]) 11/16/2017  . Irregular cardiac rhythm 11/07/2017  . Visit for preventive health examination 11/07/2017  . Breast cancer screening 11/07/2017  . History of syncope 11/03/2017  . Multifocal PVCs with pairing 11/03/2017  . Long term (current) use of anticoagulants 10/03/2017  . History of aortic stenosis 03/18/2017  . Suicide attempt by drug ingestion ([place]) 03/18/2017  . Insomnia 12/12/2014  . Marfan's disease 06/13/2014  . Chronic venous insufficiency 06/13/2014  . Left leg pain 06/13/2014  . Spinal cord cysts 06/13/2014  . Bipolar disorder, mixed ([place]) 02/10/2014    Social  History   Tobacco Use  . Smoking status: Current Every Day Smoker    Packs/day: 2.00    Years: 18.00    Pack years: 36.00    Types: Cigarettes  . Smokeless tobacco: Current User  Substance Use Topics  . Alcohol use: No    Current Outpatient Medications:  .  acetaminophen (TYLENOL 8 HOUR ARTHRITIS PAIN) 650 MG CR tablet, Take 1,300 mg by mouth every 8 (eight) hours as needed (for headache/pain relief)., Disp: , Rfl:  .  carvedilol (COREG) 6.25 MG tablet, TAKE 1 TABLET BY MOUTH TWICE A DAY, Disp: 180 tablet, Rfl: 3 .  cetirizine (ZYRTEC) 10 MG tablet, TAKE 1 TABLET BY MOUTH EVERY DAY, Disp: 90 tablet, Rfl: 2 .  citalopram (CELEXA) 20 MG tablet, Take 1 tablet (20 mg total) by mouth daily., Disp: 30 tablet, Rfl: 3 .  EPINEPHrine 0.3 mg/0.3 mL IJ SOAJ injection, USE AS DIRECTED WHEN NEEDED, Disp: , Rfl: 99 .  Multiple Vitamin (MULTIVITAMIN WITH MINERALS) TABS tablet, Take 1 tablet by mouth daily. Centrum Ultra Women's Oral, Disp: , Rfl:  .  OLANZapine (ZYPREXA) 15 MG tablet, TAKE 1 TABLET BY MOUTH EVERYDAY AT BEDTIME, Disp: 90 tablet, Rfl: 0 .  traZODone (DESYREL) 100 MG tablet, Take 1 tablet (100 mg total) by mouth at bedtime. No more refills until appointment, Disp: 30 tablet, Rfl: 0 .  warfarin (COUMADIN) 5 MG tablet, TAKE 1 TABLET DAILY ON SUN,MON,WED,FRI,SAT. TAKES 7.5 MG ON OTHER DAYS., Disp: 90 tablet, Rfl: 1 .  warfarin (COUMADIN) 7.5 MG tablet, Take 1 tablet (7.5 mg) on T, Th. Takes 5 mg on other days., Disp: 90 tablet, Rfl: 1 .  hydrOXYzine (ATARAX/VISTARIL) 25 MG tablet, TAKE 1 TO 2 TABLETS BY MOUTH AT BEDTIME CAUTION DROWSINESS, Disp: , Rfl: 2  No Known Allergies  Objective:   There were no vitals taken for this visit.  Patient is well-developed, well-nourished in no acute distress.  Resting comfortably at home.  Head is normocephalic, atraumatic.  No labored breathing.  Speech is clear and coherent with logical content.  Patient is alert and oriented at baseline.    Assessment and Plan:   1. Bipolar disorder, mixed ([place]) With recent adjustment reaction. Discussed stress-relief tactics. Will continue Citalopram and Trazodone at current doses. Increase Zyprexa to 20 mg daily. Follow-up 3 weeks. Strict ER precautions reviewed.   2. Long term (current) use of anticoagulants Overdue for INR check. She is taking medications as directed and trying to be consistent with diet. She is to schedule a lab visit for next week for repeat INR. Orders placed.    Leeanne Rio, PA-C 01/10/2020

## 2020-01-17 ENCOUNTER — Encounter: Payer: Self-pay | Admitting: Physician Assistant

## 2020-01-17 DIAGNOSIS — F3160 Bipolar disorder, current episode mixed, unspecified: Secondary | ICD-10-CM

## 2020-01-22 ENCOUNTER — Other Ambulatory Visit: Payer: Self-pay | Admitting: Emergency Medicine

## 2020-01-22 ENCOUNTER — Encounter: Payer: Self-pay | Admitting: Physician Assistant

## 2020-01-22 DIAGNOSIS — I5022 Chronic systolic (congestive) heart failure: Secondary | ICD-10-CM

## 2020-01-22 DIAGNOSIS — F3160 Bipolar disorder, current episode mixed, unspecified: Secondary | ICD-10-CM

## 2020-01-23 ENCOUNTER — Ambulatory Visit (INDEPENDENT_AMBULATORY_CARE_PROVIDER_SITE_OTHER): Payer: BC Managed Care – PPO

## 2020-01-23 ENCOUNTER — Other Ambulatory Visit: Payer: Self-pay

## 2020-01-23 DIAGNOSIS — F3160 Bipolar disorder, current episode mixed, unspecified: Secondary | ICD-10-CM

## 2020-01-23 DIAGNOSIS — I5022 Chronic systolic (congestive) heart failure: Secondary | ICD-10-CM | POA: Diagnosis not present

## 2020-01-23 DIAGNOSIS — Z7901 Long term (current) use of anticoagulants: Secondary | ICD-10-CM | POA: Diagnosis not present

## 2020-01-23 LAB — CBC WITH DIFFERENTIAL/PLATELET
Basophils Absolute: 0 10*3/uL (ref 0.0–0.1)
Basophils Relative: 0.3 % (ref 0.0–3.0)
Eosinophils Absolute: 0.1 10*3/uL (ref 0.0–0.7)
Eosinophils Relative: 0.7 % (ref 0.0–5.0)
HCT: 43.1 % (ref 36.0–46.0)
Hemoglobin: 14.4 g/dL (ref 12.0–15.0)
Lymphocytes Relative: 14.5 % (ref 12.0–46.0)
Lymphs Abs: 1.5 10*3/uL (ref 0.7–4.0)
MCHC: 33.5 g/dL (ref 30.0–36.0)
MCV: 89.4 fl (ref 78.0–100.0)
Monocytes Absolute: 0.5 10*3/uL (ref 0.1–1.0)
Monocytes Relative: 5 % (ref 3.0–12.0)
Neutro Abs: 8.1 10*3/uL — ABNORMAL HIGH (ref 1.4–7.7)
Neutrophils Relative %: 79.5 % — ABNORMAL HIGH (ref 43.0–77.0)
Platelets: 212 10*3/uL (ref 150.0–400.0)
RBC: 4.82 Mil/uL (ref 3.87–5.11)
RDW: 15.6 % — ABNORMAL HIGH (ref 11.5–15.5)
WBC: 10.2 10*3/uL (ref 4.0–10.5)

## 2020-01-23 LAB — COMPREHENSIVE METABOLIC PANEL
ALT: 9 U/L (ref 0–35)
AST: 18 U/L (ref 0–37)
Albumin: 4 g/dL (ref 3.5–5.2)
Alkaline Phosphatase: 49 U/L (ref 39–117)
BUN: 8 mg/dL (ref 6–23)
CO2: 25 mEq/L (ref 19–32)
Calcium: 8.8 mg/dL (ref 8.4–10.5)
Chloride: 106 mEq/L (ref 96–112)
Creatinine, Ser: 0.77 mg/dL (ref 0.40–1.20)
GFR: 81.7 mL/min (ref >60.00)
Glucose, Bld: 96 mg/dL (ref 70–99)
Potassium: 4 mEq/L (ref 3.5–5.1)
Sodium: 138 mEq/L (ref 135–145)
Total Bilirubin: 0.9 mg/dL (ref 0.2–1.2)
Total Protein: 6 g/dL (ref 6.0–8.3)

## 2020-01-23 LAB — LIPID PANEL
Cholesterol: 179 mg/dL (ref 0–200)
HDL: 37.5 mg/dL — ABNORMAL LOW (ref >39.00)
LDL Cholesterol: 102 mg/dL — ABNORMAL HIGH (ref 0–99)
NonHDL: 141.29
Total CHOL/HDL Ratio: 5
Triglycerides: 197 mg/dL — ABNORMAL HIGH (ref 0.0–149.0)
VLDL: 39.4 mg/dL (ref 0.0–40.0)

## 2020-01-23 LAB — PROTIME-INR
INR: 3.1 ratio — ABNORMAL HIGH (ref 0.8–1.0)
Prothrombin Time: 34.8 s — ABNORMAL HIGH (ref 9.6–13.1)

## 2020-01-23 LAB — TSH: TSH: 1.72 u[IU]/mL (ref 0.35–4.50)

## 2020-01-24 ENCOUNTER — Other Ambulatory Visit: Payer: Self-pay | Admitting: Emergency Medicine

## 2020-01-24 DIAGNOSIS — F3160 Bipolar disorder, current episode mixed, unspecified: Secondary | ICD-10-CM

## 2020-01-24 DIAGNOSIS — Z7901 Long term (current) use of anticoagulants: Secondary | ICD-10-CM

## 2020-01-24 DIAGNOSIS — F5101 Primary insomnia: Secondary | ICD-10-CM

## 2020-01-24 DIAGNOSIS — J309 Allergic rhinitis, unspecified: Secondary | ICD-10-CM

## 2020-01-24 MED ORDER — CETIRIZINE HCL 10 MG PO TABS: 10.0000 mg | ORAL_TABLET | Freq: Every day | ORAL | 3 refills | Status: DC

## 2020-01-24 MED ORDER — TRAZODONE HCL 100 MG PO TABS: 100.0000 mg | ORAL_TABLET | Freq: Every day | ORAL | 1 refills | Status: DC

## 2020-01-24 MED ORDER — CITALOPRAM HYDROBROMIDE 20 MG PO TABS: 20.0000 mg | ORAL_TABLET | Freq: Every day | ORAL | 1 refills | Status: DC

## 2020-01-24 MED ORDER — WARFARIN SODIUM 5 MG PO TABS: ORAL_TABLET | ORAL | 1 refills | Status: DC

## 2020-01-24 MED ORDER — WARFARIN SODIUM 7.5 MG PO TABS: ORAL_TABLET | ORAL | 1 refills | Status: DC

## 2020-01-25 ENCOUNTER — Encounter: Payer: Self-pay | Admitting: Physician Assistant

## 2020-02-01 ENCOUNTER — Other Ambulatory Visit: Payer: Self-pay | Admitting: Physician Assistant

## 2020-02-01 NOTE — Telephone Encounter (Signed)
Please advise if ok to fill?  

## 2020-02-02 ENCOUNTER — Encounter: Payer: Self-pay | Admitting: Physician Assistant

## 2020-02-26 IMAGING — CR DG CHEST 2V
2 series · 2 of 2 positions shown · non-contrast
Comparison: 03/18/2017

CLINICAL DATA: Post pacer

EXAM:
CHEST - 2 VIEW

[chest pa]
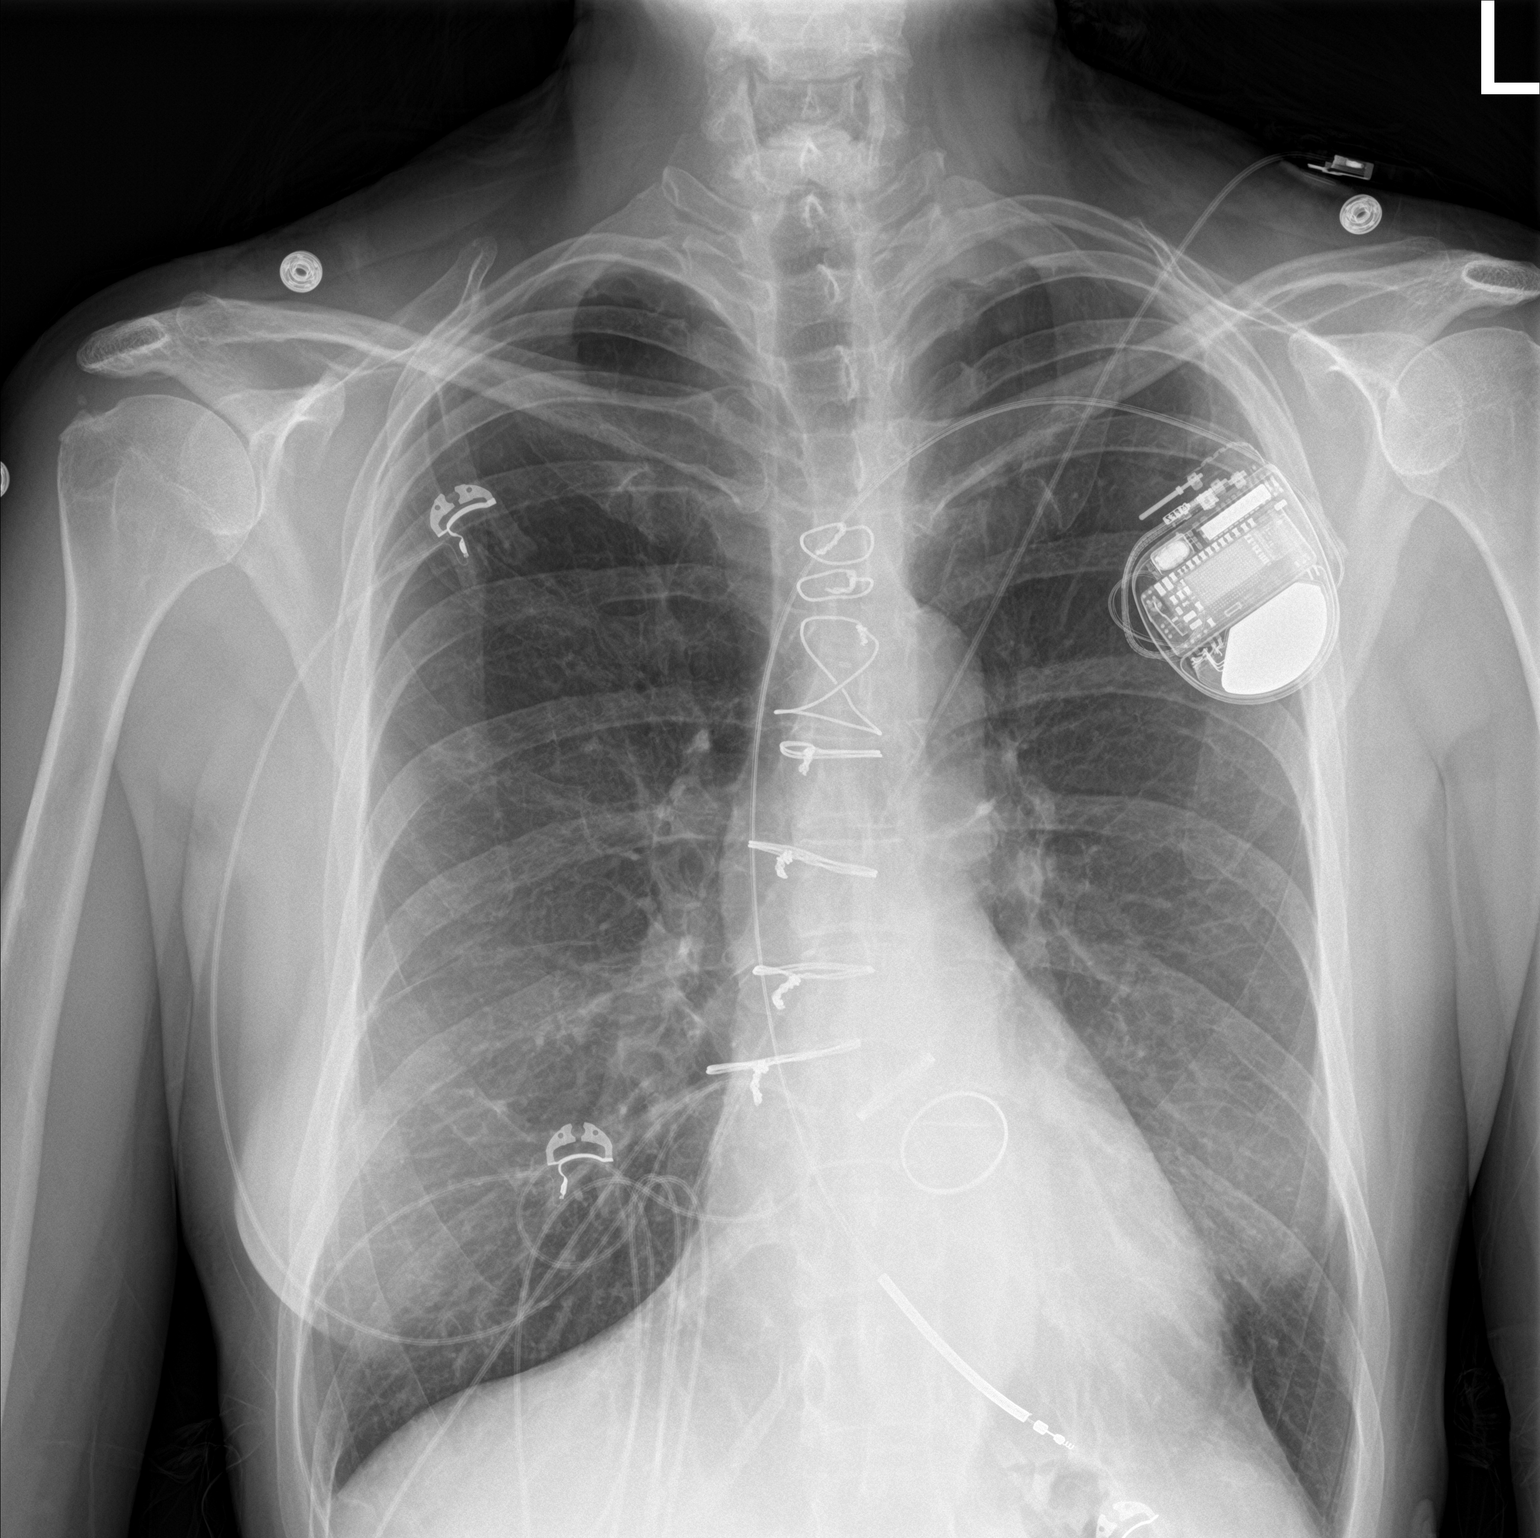

[chest lat]
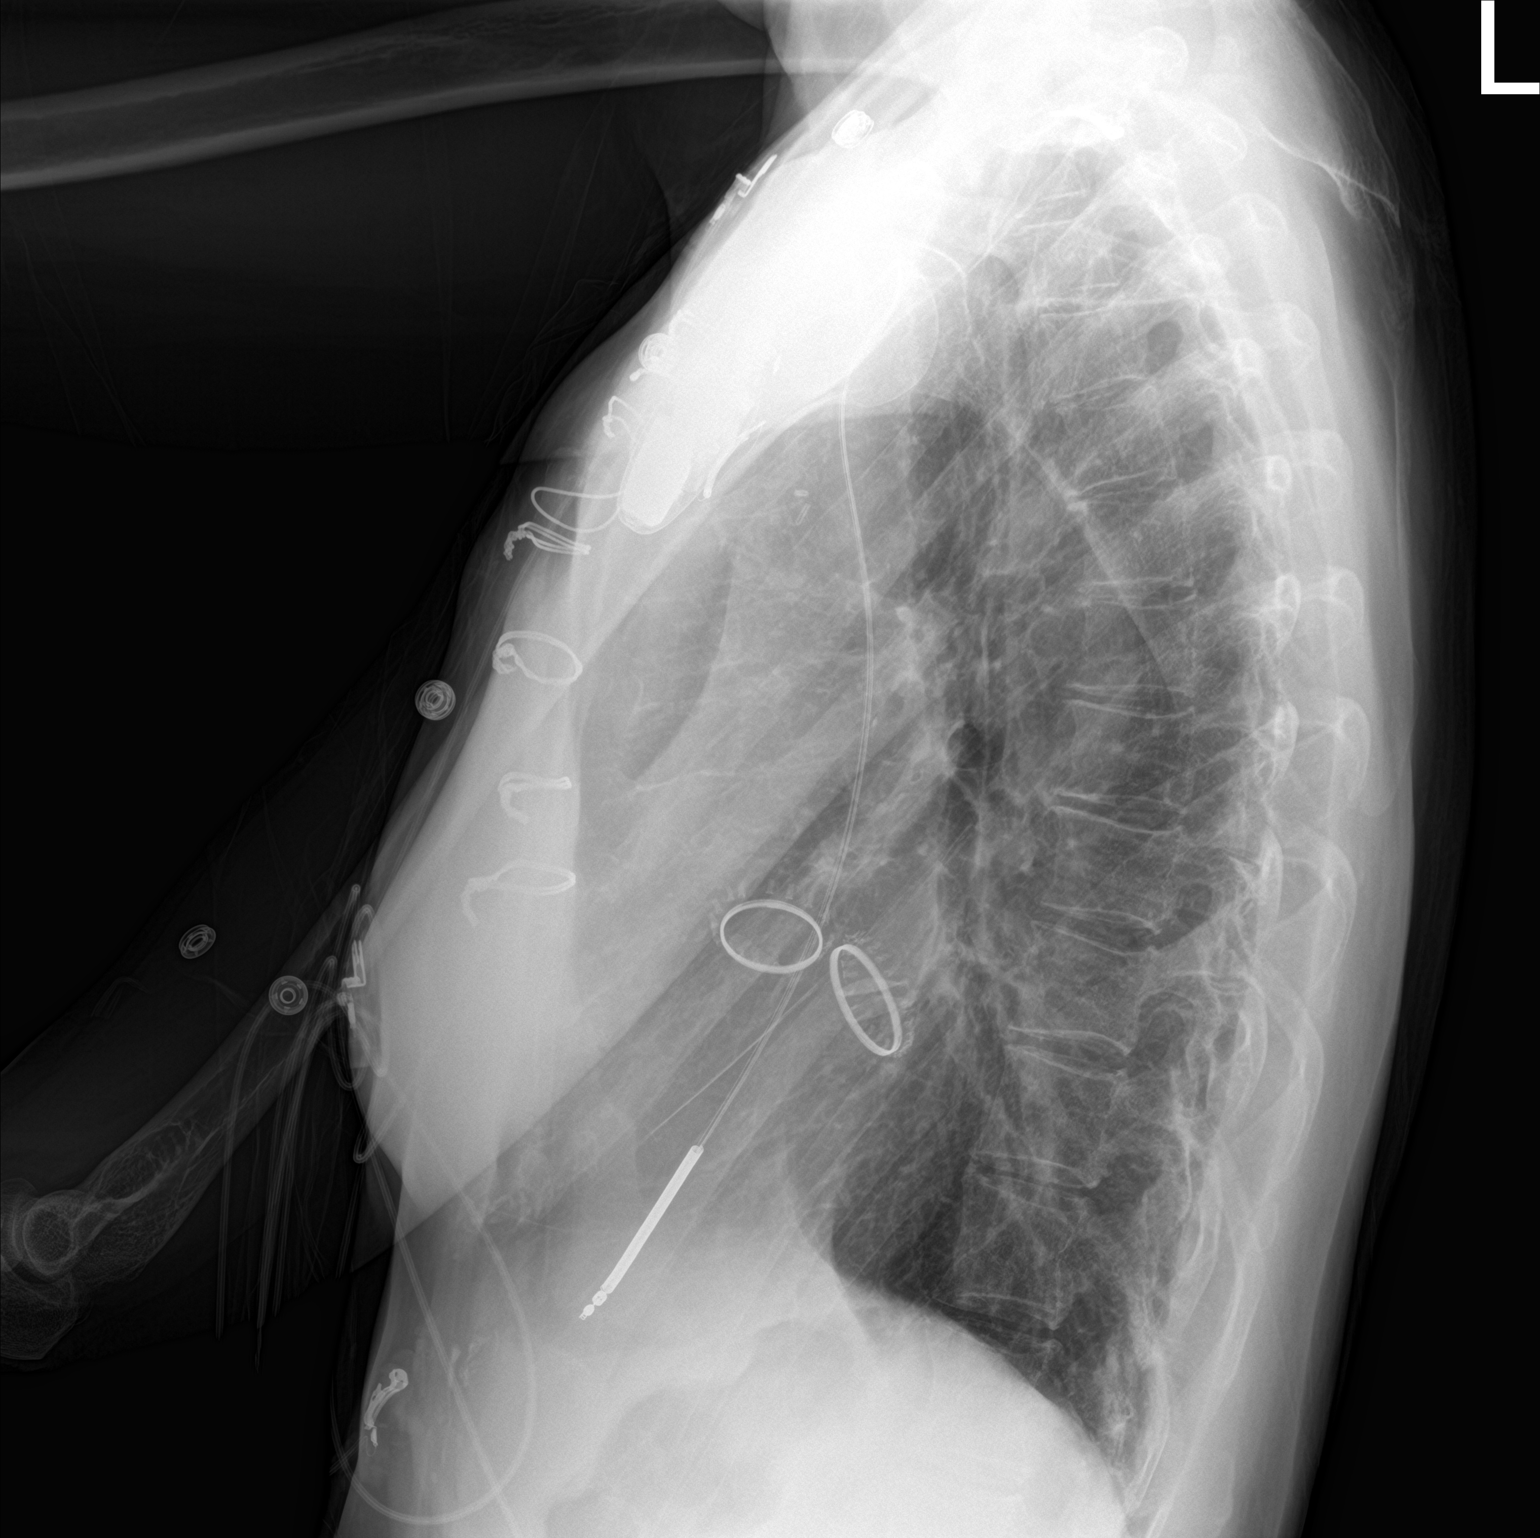

[2 of 2 positions shown; findings below may reference images not displayed]

FINDINGS: Left AICD has been placed with single lead tip in the right
ventricle. No pneumothorax. Prior median sternotomy and valve
replacement. There is hyperinflation of the lungs compatible with
COPD. Heart and mediastinal contours are within normal limits. No
focal opacities or effusions. No acute bony abnormality.
IMPRESSION: COPD.  No active disease.

No pneumothorax following left AICD placement.

## 2020-07-08 ENCOUNTER — Encounter: Payer: Self-pay | Admitting: Physician Assistant

## 2020-07-09 NOTE — Telephone Encounter (Signed)
She needs appt. She is overdue for follow-up and for PT/INR check.

## 2020-08-13 ENCOUNTER — Encounter: Payer: Self-pay | Admitting: Physician Assistant

## 2020-08-13 DIAGNOSIS — F3160 Bipolar disorder, current episode mixed, unspecified: Secondary | ICD-10-CM

## 2020-08-13 DIAGNOSIS — Z7901 Long term (current) use of anticoagulants: Secondary | ICD-10-CM

## 2020-08-14 MED ORDER — CITALOPRAM HYDROBROMIDE 20 MG PO TABS: 20.0000 mg | ORAL_TABLET | Freq: Every day | ORAL | 0 refills | Status: DC

## 2020-08-14 MED ORDER — WARFARIN SODIUM 5 MG PO TABS: ORAL_TABLET | ORAL | 0 refills | Status: DC

## 2020-08-14 MED ORDER — OLANZAPINE 20 MG PO TABS: ORAL_TABLET | ORAL | 0 refills | Status: DC

## 2020-08-14 NOTE — Telephone Encounter (Signed)
Ok to give 30 day of her medications until her appt.

## 2020-08-19 ENCOUNTER — Encounter: Payer: Self-pay | Admitting: Physician Assistant

## 2020-08-19 ENCOUNTER — Ambulatory Visit (INDEPENDENT_AMBULATORY_CARE_PROVIDER_SITE_OTHER): Payer: 59 | Admitting: Physician Assistant

## 2020-08-19 ENCOUNTER — Other Ambulatory Visit: Payer: Self-pay

## 2020-08-19 VITALS — BP 100/70 | HR 88 | Temp 98.4°F | Resp 16 | Ht 74.0 in | Wt 160.0 lb

## 2020-08-19 DIAGNOSIS — Z7901 Long term (current) use of anticoagulants: Secondary | ICD-10-CM | POA: Diagnosis not present

## 2020-08-19 DIAGNOSIS — I428 Other cardiomyopathies: Secondary | ICD-10-CM | POA: Diagnosis not present

## 2020-08-19 DIAGNOSIS — F3160 Bipolar disorder, current episode mixed, unspecified: Secondary | ICD-10-CM

## 2020-08-19 DIAGNOSIS — N926 Irregular menstruation, unspecified: Secondary | ICD-10-CM

## 2020-08-19 DIAGNOSIS — Z720 Tobacco use: Secondary | ICD-10-CM | POA: Diagnosis not present

## 2020-08-19 LAB — COMPREHENSIVE METABOLIC PANEL
ALT: 9 U/L (ref 0–35)
AST: 17 U/L (ref 0–37)
Albumin: 4.1 g/dL (ref 3.5–5.2)
Alkaline Phosphatase: 46 U/L (ref 39–117)
BUN: 11 mg/dL (ref 6–23)
CO2: 26 mEq/L (ref 19–32)
Calcium: 9 mg/dL (ref 8.4–10.5)
Chloride: 106 mEq/L (ref 96–112)
Creatinine, Ser: 0.74 mg/dL (ref 0.40–1.20)
GFR: 98.78 mL/min (ref >60.00)
Glucose, Bld: 80 mg/dL (ref 70–99)
Potassium: 4.1 mEq/L (ref 3.5–5.1)
Sodium: 138 mEq/L (ref 135–145)
Total Bilirubin: 0.8 mg/dL (ref 0.2–1.2)
Total Protein: 6.2 g/dL (ref 6.0–8.3)

## 2020-08-19 LAB — CBC WITH DIFFERENTIAL/PLATELET
Basophils Absolute: 0 10*3/uL (ref 0.0–0.1)
Basophils Relative: 0.5 % (ref 0.0–3.0)
Eosinophils Absolute: 0.1 10*3/uL (ref 0.0–0.7)
Eosinophils Relative: 1 % (ref 0.0–5.0)
HCT: 43.4 % (ref 36.0–46.0)
Hemoglobin: 14.5 g/dL (ref 12.0–15.0)
Lymphocytes Relative: 15.1 % (ref 12.0–46.0)
Lymphs Abs: 1.3 10*3/uL (ref 0.7–4.0)
MCHC: 33.3 g/dL (ref 30.0–36.0)
MCV: 90.5 fl (ref 78.0–100.0)
Monocytes Absolute: 0.6 10*3/uL (ref 0.1–1.0)
Monocytes Relative: 6.7 % (ref 3.0–12.0)
Neutro Abs: 6.6 10*3/uL (ref 1.4–7.7)
Neutrophils Relative %: 76.7 % (ref 43.0–77.0)
Platelets: 241 10*3/uL (ref 150.0–400.0)
RBC: 4.8 Mil/uL (ref 3.87–5.11)
RDW: 15.8 % — ABNORMAL HIGH (ref 11.5–15.5)
WBC: 8.5 10*3/uL (ref 4.0–10.5)

## 2020-08-19 LAB — PROTIME-INR
INR: 2.4 ratio — ABNORMAL HIGH (ref 0.8–1.0)
Prothrombin Time: 26.6 s — ABNORMAL HIGH (ref 9.6–13.1)

## 2020-08-19 LAB — TSH: TSH: 2.77 u[IU]/mL (ref 0.35–4.50)

## 2020-08-19 MED ORDER — NICOTINE 21 MG/24HR TD PT24: 21.0000 mg | MEDICATED_PATCH | Freq: Every day | TRANSDERMAL | 0 refills | Status: DC

## 2020-08-19 NOTE — Progress Notes (Signed)
Patient presents to clinic today to follow-up regarding chronic medical issues. Has history of poor treatment adherence.   Bipolar Disorder -- Patient currently on a multi-drug regimen including Olanzapine 20 mg, Citalopram 20 mg and Trazodone 100 mg. Is taking medications daily as directed and notes continuing to tolerate well. Notes her mood has been doing great without any manic episodes or episodes of significant depression. Anxiety and sleep are doing well. Is very happy with results.   Long-Term Use of Anticoagulants -- Continues on coumadin giving valvular issues with Marfan and prior clotting history. Is taking as directed. Denies any easy bruising or bleeding. Is quite overdue for INR check.  Tobacco Abuse Disorder -- Has been able to decrease on her own from 2 ppd to 1 ppd. Has been unable to decrease further. Has been on Chantix and Wellbutrin previously with poor tolerability. Tried patches before but states she really was not ready to quit and did not use very long. Is interested in restarting this.   Irregular Periods -- Longstanding history, previously controlled with depo-provera injections. Has been off of these for some time and was non-adherent to follow-up when taking. Would like to discuss other options but she is continuing to smoke. Is interested in referral to GYN.   Past Medical History:  Diagnosis Date   Anticoagulant long-term use    after valve surgery for Marfan's syndrome   Anxiety    Ascending aorta dilatation (HCC)    Bipolar disorder (HCC)    Depression    Marfan's syndrome    Dr. Atilano Median   Migraine    Mitral valve prolapse    Palpitations    Suicidal behavior    Tylenol Overdose   Varicose veins     Current Outpatient Medications on File Prior to Visit  Medication Sig Dispense Refill   acetaminophen (TYLENOL 8 HOUR ARTHRITIS PAIN) 650 MG CR tablet Take 1,300 mg by mouth every 8 (eight) hours as needed (for headache/pain relief).      carvedilol (COREG) 6.25 MG tablet TAKE 1 TABLET BY MOUTH TWICE A DAY 180 tablet 3   citalopram (CELEXA) 20 MG tablet Take 1 tablet (20 mg total) by mouth daily. 30 tablet 0   EPINEPHrine 0.3 mg/0.3 mL IJ SOAJ injection USE AS DIRECTED WHEN NEEDED  99   Multiple Vitamin (MULTIVITAMIN WITH MINERALS) TABS tablet Take 1 tablet by mouth daily. Centrum Ultra Women's Oral     OLANZapine (ZYPREXA) 20 MG tablet TAKE 1 TABLET BY MOUTH EVERYDAY AT BEDTIME 30 tablet 0   traZODone (DESYREL) 100 MG tablet Take 1 tablet (100 mg total) by mouth at bedtime. No more refills until appointment 90 tablet 1   warfarin (COUMADIN) 5 MG tablet TAKE 1 TABLET DAILY ON SUN,MON,WED,FRI,SAT. TAKES 7.5 MG ON OTHER DAYS. 30 tablet 0   warfarin (COUMADIN) 7.5 MG tablet Take 1 tablet (7.5 mg) on T, Th. Takes 5 mg on other days. 90 tablet 1   No current facility-administered medications on file prior to visit.    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  Off and on    Alcohol Use:  None   Drug Use:  None   Diet:  Regular   Exercise:  None   Hobbies:  Reading, Traveling             Social Determinants of Health   Financial Resource Strain:    Difficulty of Paying Living Expenses: Not on file  Food Insecurity:    Worried About Charity fundraiser in the Last Year: Not on file   [employer] of Food in the Last Year: Not on file  Transportation Needs:    Lack of Transportation (Medical): Not on file   Lack of Transportation (Non-Medical): Not on file  Physical Activity:    Days of Exercise per Week: Not on file   Minutes of Exercise per Session: Not on file  Stress:    Feeling of Stress : Not on file  Social Connections:    Frequency of Communication with Friends and Family: Not on file   Frequency of Social Gatherings with Friends and Family: Not on file   Attends Religious Services: Not on file   Active Member of Clubs or Organizations: Not on file   Attends Archivist Meetings: Not on file   Marital Status: Not on file   Review of Systems - See HPI.  All other ROS are negative.  Wt 160 lb (72.6 kg)    BMI 20.54 kg/m   Physical Exam Vitals reviewed.  Constitutional:      Appearance: Normal appearance.  HENT:     Head: Normocephalic and atraumatic.     Right Ear: Tympanic membrane normal.     Left Ear: Tympanic membrane normal.  Eyes:     Conjunctiva/sclera: Conjunctivae normal.     Pupils: Pupils are equal, round, and reactive to light.  Cardiovascular:     Rate and Rhythm: Normal rate and regular rhythm.     Pulses: Normal pulses.     Heart sounds: Normal heart sounds.  Pulmonary:     Effort: Pulmonary effort is normal.     Breath sounds: Normal breath sounds.  Musculoskeletal:     Cervical back: Neck supple.  Neurological:     General: No focal deficit present.     Mental Status: She is alert and oriented to person, place, and time.    Psychiatric:        Mood and Affect: Mood normal.    Assessment/Plan: 1. Tobacco abuse disorder Discussed safe options with patient.  She is elected to retry the NicoDerm patches.  Discussed with her she needs to remain off cigarettes while using the patches.  She agrees to work on this.  Close follow-up scheduled. - nicotine (NICODERM CQ) 21 mg/24hr patch; Place 1 patch (21 mg total) onto the skin daily.  Dispense: 28 patch; Refill: 0  2. Bipolar disorder, mixed ([place]) Stable.  Plan to continue current regimen.  We will repeat CBC, CMP and TSH today. - CBC with Differential/Platelet - Comprehensive metabolic panel - TSH  3. Long term (current) use of anticoagulants Discussed need for compliance with treatment regimen.  Patient states she is in a good place and will be able to be compliant with with INR checks.  Will check PT/INR and CMP today. - Comprehensive metabolic panel - Protime-INR ( [facility] ONLY)  4. Nonischemic cardiomyopathy [facility]) Patient overdue for follow-up with cardiology.  Blood pressure stable today.  Asymptomatic.  She agrees to call her specialist to schedule follow-up.  5. Irregular menstrual cycle Referral to GYN placed - Ambulatory referral to Gynecology  This visit occurred during the SARS-CoV-2 public health emergency.  Safety protocols were in place, including screening questions prior to the visit, additional usage of staff PPE, and extensive cleaning of exam room while observing appropriate contact time as indicated for disinfecting solutions.     Leeanne Rio, PA-C

## 2020-08-19 NOTE — Patient Instructions (Signed)
Please go to the lab today for blood work.  I will call you with your results. We will alter treatment regimen(s) if indicated by your results.   Please continue chronic medication regimen.  We are starting the Nicoderm patch at highest dose for the next month.  No smoking while using the patch.  Let's follow-up via video/phone in 3-4 weeks to reassess and make further changes.   You will be contacted by Gynecology for management of menstrual changes. I also need you to call your Cardiologist to schedule an appointment as you are quite overdue.   We will schedule follow-up based on lab results.   Hang in there!

## 2020-08-21 ENCOUNTER — Other Ambulatory Visit: Payer: Self-pay | Admitting: Emergency Medicine

## 2020-08-21 DIAGNOSIS — Z7901 Long term (current) use of anticoagulants: Secondary | ICD-10-CM

## 2020-09-05 ENCOUNTER — Other Ambulatory Visit: Payer: Self-pay | Admitting: Physician Assistant

## 2020-09-05 DIAGNOSIS — F3160 Bipolar disorder, current episode mixed, unspecified: Secondary | ICD-10-CM

## 2020-09-06 ENCOUNTER — Other Ambulatory Visit: Payer: Self-pay | Admitting: Physician Assistant

## 2020-09-06 DIAGNOSIS — F3160 Bipolar disorder, current episode mixed, unspecified: Secondary | ICD-10-CM

## 2020-09-08 NOTE — Telephone Encounter (Signed)
Olanzapine last rx 08/14/2020 #30 LOV: 08/19/20

## 2020-09-14 ENCOUNTER — Encounter: Payer: Self-pay | Admitting: Physician Assistant

## 2020-09-14 DIAGNOSIS — F5101 Primary insomnia: Secondary | ICD-10-CM

## 2020-09-15 MED ORDER — TRAZODONE HCL 100 MG PO TABS: 100.0000 mg | ORAL_TABLET | Freq: Every day | ORAL | 0 refills | Status: DC

## 2020-09-19 ENCOUNTER — Encounter: Payer: Self-pay | Admitting: Physician Assistant

## 2020-09-19 ENCOUNTER — Other Ambulatory Visit: Payer: Self-pay

## 2020-09-19 DIAGNOSIS — I671 Cerebral aneurysm, nonruptured: Secondary | ICD-10-CM

## 2020-09-19 NOTE — Telephone Encounter (Signed)
Ok to place referral for patient. She needs Er follow-up with me as well.

## 2020-10-07 ENCOUNTER — Other Ambulatory Visit: Payer: Self-pay | Admitting: Physician Assistant

## 2020-10-07 DIAGNOSIS — F3160 Bipolar disorder, current episode mixed, unspecified: Secondary | ICD-10-CM

## 2020-10-07 DIAGNOSIS — Z7901 Long term (current) use of anticoagulants: Secondary | ICD-10-CM

## 2020-10-07 DIAGNOSIS — F5101 Primary insomnia: Secondary | ICD-10-CM

## 2020-10-08 NOTE — Telephone Encounter (Signed)
CAn give 30 tablets of each dose. Contact patient to schedule follow-up INR testing. If we are unable to be compliant with coumadin  checks I will no longer be able to keep prescribing.

## 2020-10-08 NOTE — Telephone Encounter (Signed)
Warfarin 5mg  tab LFD 08/14/20 #30 with no refills Warfarin 7.5mg  tab LFD 01/24/20 #90 with 1 refill Last INR was 08/19/20, abnormal at 2.4 with goal of 3, per PCP patient was supposed to recheck in 14 days. Cannot find recheck results.

## 2020-11-03 ENCOUNTER — Other Ambulatory Visit: Payer: 59

## 2020-11-27 ENCOUNTER — Other Ambulatory Visit: Payer: Self-pay | Admitting: Physician Assistant

## 2020-11-27 DIAGNOSIS — Z7901 Long term (current) use of anticoagulants: Secondary | ICD-10-CM

## 2020-12-17 ENCOUNTER — Other Ambulatory Visit: Payer: Self-pay | Admitting: Physician Assistant

## 2020-12-17 DIAGNOSIS — F5101 Primary insomnia: Secondary | ICD-10-CM

## 2020-12-17 DIAGNOSIS — F3160 Bipolar disorder, current episode mixed, unspecified: Secondary | ICD-10-CM

## 2020-12-17 NOTE — Telephone Encounter (Signed)
Olanzapine last rx 09/08/20 #90 Trazodone last rx 09/15/20 #90 LOV: 08/19/20 NOV: 01/13/21 TOC Richard

## 2020-12-17 NOTE — Telephone Encounter (Signed)
Pt has a TOC appt with Richard on 01/13/21, she will be out of her Olanzapine and Trazodone before the this appt. Can we send in a refill to [facility] [place].  Pt can be reached at the home #

## 2020-12-19 MED ORDER — OLANZAPINE 20 MG PO TABS
20.0000 mg | ORAL_TABLET | Freq: Every day | ORAL | 0 refills | Status: DC
Start: 2020-12-19 — End: 2021-03-07

## 2020-12-19 MED ORDER — TRAZODONE HCL 100 MG PO TABS
100.0000 mg | ORAL_TABLET | Freq: Every day | ORAL | 0 refills | Status: DC
Start: 2020-12-19 — End: 2021-03-07

## 2020-12-19 NOTE — Telephone Encounter (Signed)
Can you take care of this in the absences of Dutch Quint?  Pt is out of her medication.  Please advise

## 2021-01-05 ENCOUNTER — Telehealth: Payer: Self-pay | Admitting: Physician Assistant

## 2021-01-05 DIAGNOSIS — Z7901 Long term (current) use of anticoagulants: Secondary | ICD-10-CM

## 2021-01-05 MED ORDER — WARFARIN SODIUM 5 MG PO TABS
ORAL_TABLET | ORAL | 0 refills | Status: DC
Start: 2021-01-05 — End: 2021-03-31

## 2021-01-05 NOTE — Telephone Encounter (Signed)
She has an appt upcoming on 4/5 w/ new provider.  I will send in 15 pills of Coumadin to get her to that appt

## 2021-01-05 NOTE — Telephone Encounter (Signed)
Pt requesting refill Warfarin until can be seen for TOC with Maximiano Coss

## 2021-01-05 NOTE — Telephone Encounter (Signed)
Pt called in asking for a refill on the Warfarin 7.5mg  she has a TOC scheduled with Richard   Please advise

## 2021-01-05 NOTE — Telephone Encounter (Signed)
Forwarding to you for review since I am no longer managing these patients nor with the group. Looks like her last INR was 5 months ago -- would recommend getting her in for INR check to determine if any dose change is needed before she sees her new PCP.

## 2021-01-13 ENCOUNTER — Encounter: Payer: Managed Care, Other (non HMO) | Admitting: Registered Nurse

## 2021-02-04 ENCOUNTER — Encounter: Payer: Managed Care, Other (non HMO) | Admitting: Registered Nurse

## 2021-02-04 DIAGNOSIS — Z0289 Encounter for other administrative examinations: Secondary | ICD-10-CM

## 2021-03-05 ENCOUNTER — Other Ambulatory Visit: Payer: Self-pay | Admitting: Family Medicine

## 2021-03-05 DIAGNOSIS — F5101 Primary insomnia: Secondary | ICD-10-CM

## 2021-03-05 DIAGNOSIS — F3160 Bipolar disorder, current episode mixed, unspecified: Secondary | ICD-10-CM

## 2021-03-06 NOTE — Telephone Encounter (Signed)
Olanzapine LFD 12/19/20 #90 with no refills Trazodone LFD 12/19/20 #90 with no refills LOV 08/19/20 NOV none

## 2021-03-31 ENCOUNTER — Encounter: Payer: Self-pay | Admitting: Registered Nurse

## 2021-03-31 ENCOUNTER — Ambulatory Visit (INDEPENDENT_AMBULATORY_CARE_PROVIDER_SITE_OTHER): Payer: Managed Care, Other (non HMO) | Admitting: Registered Nurse

## 2021-03-31 ENCOUNTER — Other Ambulatory Visit: Payer: Self-pay

## 2021-03-31 VITALS — BP 108/70 | HR 90 | Temp 98.0°F | Ht 74.0 in | Wt 159.2 lb

## 2021-03-31 DIAGNOSIS — Z3009 Encounter for other general counseling and advice on contraception: Secondary | ICD-10-CM | POA: Diagnosis not present

## 2021-03-31 DIAGNOSIS — Z716 Tobacco abuse counseling: Secondary | ICD-10-CM | POA: Diagnosis not present

## 2021-03-31 DIAGNOSIS — Z130 Encounter for screening for diseases of the blood and blood-forming organs and certain disorders involving the immune mechanism: Secondary | ICD-10-CM | POA: Diagnosis not present

## 2021-03-31 DIAGNOSIS — Z1329 Encounter for screening for other suspected endocrine disorder: Secondary | ICD-10-CM | POA: Diagnosis not present

## 2021-03-31 DIAGNOSIS — Z13220 Encounter for screening for lipoid disorders: Secondary | ICD-10-CM | POA: Diagnosis not present

## 2021-03-31 DIAGNOSIS — Z7901 Long term (current) use of anticoagulants: Secondary | ICD-10-CM | POA: Diagnosis not present

## 2021-03-31 DIAGNOSIS — F3160 Bipolar disorder, current episode mixed, unspecified: Secondary | ICD-10-CM

## 2021-03-31 DIAGNOSIS — Z13228 Encounter for screening for other metabolic disorders: Secondary | ICD-10-CM | POA: Diagnosis not present

## 2021-03-31 DIAGNOSIS — F5101 Primary insomnia: Secondary | ICD-10-CM

## 2021-03-31 DIAGNOSIS — Z1159 Encounter for screening for other viral diseases: Secondary | ICD-10-CM

## 2021-03-31 DIAGNOSIS — Z87892 Personal history of anaphylaxis: Secondary | ICD-10-CM

## 2021-03-31 LAB — POCT URINE PREGNANCY: Preg Test, Ur: NEGATIVE

## 2021-03-31 MED ORDER — TRAZODONE HCL 100 MG PO TABS: ORAL_TABLET | ORAL | 1 refills | Status: DC

## 2021-03-31 MED ORDER — BUPROPION HCL ER (SR) 100 MG PO TB12: 100.0000 mg | ORAL_TABLET | Freq: Two times a day (BID) | ORAL | 0 refills | Status: DC

## 2021-03-31 MED ORDER — CITALOPRAM HYDROBROMIDE 20 MG PO TABS: 20.0000 mg | ORAL_TABLET | Freq: Every day | ORAL | 1 refills | Status: DC

## 2021-03-31 MED ORDER — EPINEPHRINE 0.3 MG/0.3ML IJ SOAJ: INTRAMUSCULAR | 99 refills | Status: AC

## 2021-03-31 MED ORDER — WARFARIN SODIUM 5 MG PO TABS: ORAL_TABLET | ORAL | 1 refills | Status: DC

## 2021-03-31 MED ORDER — OLANZAPINE 20 MG PO TABS: ORAL_TABLET | ORAL | 1 refills | Status: DC

## 2021-03-31 MED ORDER — MEDROXYPROGESTERONE ACETATE 150 MG/ML IM SUSP
150.0000 mg | Freq: Once | INTRAMUSCULAR | Status: AC
  Administered 2021-03-31: 150 mg via INTRAMUSCULAR

## 2021-03-31 NOTE — Patient Instructions (Signed)
Ms. Melissa Hayden to meet you  As discussed -  Labs today, should be back tomorrow or Thursday. I'll be in touch with those results.  Start wellbutrin 100mg  SR by mouth twice daily. This can help with smoking cessation  I have sent a message to Dr. Rosezella [place] office to reach out. They should be in touch soon  3 mo for labs and depo follow up, 6 mo for next in office visit  Thank you   Denice Paradise

## 2021-03-31 NOTE — Progress Notes (Signed)
Established Patient Office Visit  Subjective:  Patient ID: Melissa Hayden, female    DOB: 01-23-1976  Age: 45 y.o. MRN: [id number]  CC: No chief complaint on file.   HPI Melissa Hayden presents for [employer]  Formerly pt of Elyn Aquas, [place]  Histories reviewed and updated with patient.   Needs med refills:  Anticoagulation: On warfarin, hx of mitral valve prolapse and Marfan's 5mg  every day except tu and th 7.5mg  Tolerating well, no Aes.  Last PT INR in November within therapeutic range  Bipolar and Depression On celexa 20mg  PO qd, trazodone 100mg  PO qhs, and Zyprexa 20mg  PO qhs Good effect, no Aes.  Hx of anaphylaxis Unsure of stimulant Has been worked up with no clear cause Keeps epi-pen on hand just in case.  Smoking cessation Has tried nicotine patch in the past with limited effect Interested in options Has not been on chantix or wellbutrin Would be open to options  Contraception Has been on depo injection in the past Tolerated well Hopes to continue Last on this 2-3 years ago No upic in past week Aware of r/b/se  Otherwise no concerns Overdue for cardiology follow up    Past Medical History:  Diagnosis Date   Anticoagulant long-term use    after valve surgery for Marfan's syndrome   Anxiety    Ascending aorta dilatation (HCC)    Bipolar disorder ([place])    Depression    Marfan's syndrome    Dr. Atilano Median   Migraine    Mitral valve prolapse    Palpitations    Suicidal behavior    Tylenol Overdose   Varicose veins     Past Surgical History:  Procedure Laterality Date   ANKLE SURGERY Left    BREAST CYST EXCISION     CARDIAC VALVE SURGERY  09/2014   AV, MV replacement, aortic root replacement (Dr Ysidro Evert at [facility])   ICD IMPLANT N/A 01/05/2018   Procedure: ICD IMPLANT;  Surgeon: Evans Lance, MD;  Location: [facility];  Service: Cardiovascular;  Laterality: N/A;   INNER EAR SURGERY Right     Family History  Problem Relation Age of Onset    Cancer Mother 76       Breast    Marfan syndrome Father    Heart disease Father    Marfan syndrome Sister    Marfan syndrome Paternal Grandmother    Marfan syndrome Sister     Social History   Socioeconomic History   Marital status: Divorced    Spouse name: Not on file   Number of children: Not on file   Years of education: 16   Highest education level: Not on file  Occupational History   Occupation: Progressive  Tobacco Use   Smoking status: Every Day    Packs/day: 1.00    Years: 18.00    Pack years: 18.00    Types: Cigarettes   Smokeless tobacco: Current  Vaping Use   Vaping Use: Never used  Substance and Sexual Activity   Alcohol use: No   Drug use: No   Sexual activity: Not Currently  Other Topics Concern   Not on file  Social History Narrative   Marital Status: Married   Children:  None    Pets:  5 dogs    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  Off and on    Alcohol Use:  None   Drug  Use:  None   Diet:  Regular   Exercise:  None   Hobbies:  Reading, Traveling             Social Determinants of Health   Financial Resource Strain: Not on file  Food Insecurity: Not on file  Transportation Needs: Not on file  Physical Activity: Not on file  Stress: Not on file  Social Connections: Not on file  Intimate Partner Violence: Not on file    Outpatient Medications Prior to Visit  Medication Sig Dispense Refill   acetaminophen (TYLENOL) 650 MG CR tablet Take 1,300 mg by mouth every 8 (eight) hours as needed (for headache/pain relief).     carvedilol (COREG) 6.25 MG tablet TAKE 1 TABLET BY MOUTH TWICE A DAY 180 tablet 3   citalopram (CELEXA) 20 MG tablet TAKE 1 TABLET BY MOUTH EVERY DAY 90 tablet 1   EPINEPHrine 0.3 mg/0.3 mL IJ SOAJ injection USE AS DIRECTED WHEN NEEDED  99   Multiple Vitamin (MULTIVITAMIN WITH MINERALS) TABS tablet Take 1 tablet by mouth daily. Centrum Ultra Women's  Oral     OLANZapine (ZYPREXA) 20 MG tablet TAKE 1 TABLET BY MOUTH EVERYDAY AT BEDTIME 90 tablet 0   traZODone (DESYREL) 100 MG tablet TAKE 1 TABLET BY MOUTH EVERYDAY AT BEDTIME 90 tablet 0   warfarin (COUMADIN) 5 MG tablet TAKE 1 TABLET DAILY ON SUN,MON,WED,FRI,SAT. TAKES 7.5 MG ON OTHER DAYS. 15 tablet 0   nicotine (NICODERM CQ) 21 mg/24hr patch Place 1 patch (21 mg total) onto the skin daily. 28 patch 0   No facility-administered medications prior to visit.    No Known Allergies  ROS Review of Systems  Constitutional: Negative.   HENT: Negative.    Eyes: Negative.   Respiratory: Negative.    Cardiovascular: Negative.   Gastrointestinal: Negative.   Genitourinary: Negative.   Musculoskeletal: Negative.   Skin: Negative.   Neurological: Negative.   Psychiatric/Behavioral: Negative.    All other systems reviewed and are negative.    Objective:    Physical Exam Vitals and nursing note reviewed.  Constitutional:      General: She is not in acute distress.    Appearance: Normal appearance. She is normal weight. She is not ill-appearing, toxic-appearing or diaphoretic.  Cardiovascular:     Rate and Rhythm: Normal rate and regular rhythm.     Heart sounds: Normal heart sounds. No murmur heard.   No friction rub. No gallop.  Pulmonary:     Effort: Pulmonary effort is normal. No respiratory distress.     Breath sounds: Normal breath sounds. No stridor. No wheezing, rhonchi or rales.  Chest:     Chest wall: No tenderness.  Skin:    General: Skin is warm and dry.  Neurological:     General: No focal deficit present.     Mental Status: She is alert and oriented to person, place, and time. Mental status is at baseline.  Psychiatric:        Mood and Affect: Mood normal.        Behavior: Behavior normal.        Thought Content: Thought content normal.        Judgment: Judgment normal.    BP 108/70   Pulse 90   Temp 98 F (36.7 C)   Ht 6\' 2"  (1.88 m)   Wt 159 lb 3.2 oz  (72.2 kg)   SpO2 95%   BMI 20.44 kg/m  Wt Readings from Last 3 Encounters:  03/31/21  159 lb 3.2 oz (72.2 kg)  08/19/20 160 lb (72.6 kg)  12/07/18 178 lb (80.7 kg)     Health Maintenance Due  Topic Date Due   HIV Screening  Never done   Hepatitis C Screening  Never done   PAP SMEAR-Modifier  11/09/2020    There are no preventive care reminders to display for this patient.  Lab Results  Component Value Date   TSH 2.77 08/19/2020   Lab Results  Component Value Date   WBC 8.5 08/19/2020   HGB 14.5 08/19/2020   HCT 43.4 08/19/2020   MCV 90.5 08/19/2020   PLT 241.0 08/19/2020   Lab Results  Component Value Date   NA 138 08/19/2020   K 4.1 08/19/2020   CO2 26 08/19/2020   GLUCOSE 80 08/19/2020   BUN 11 08/19/2020   CREATININE 0.74 08/19/2020   BILITOT 0.8 08/19/2020   ALKPHOS 46 08/19/2020   AST 17 08/19/2020   ALT 9 08/19/2020   PROT 6.2 08/19/2020   ALBUMIN 4.1 08/19/2020   CALCIUM 9.0 08/19/2020   GFR 98.78 08/19/2020   Lab Results  Component Value Date   CHOL 179 01/23/2020   Lab Results  Component Value Date   HDL 37.50 (L) 01/23/2020   Lab Results  Component Value Date   LDLCALC 102 (H) 01/23/2020   Lab Results  Component Value Date   TRIG 197.0 (H) 01/23/2020   Lab Results  Component Value Date   CHOLHDL 5 01/23/2020   Lab Results  Component Value Date   HGBA1C 5.2 10/31/2017      Assessment & Plan:   Problem List Items Addressed This Visit       Other   Bipolar disorder, mixed (HCC) (Chronic)   Relevant Medications   citalopram (CELEXA) 20 MG tablet   OLANZapine (ZYPREXA) 20 MG tablet   Insomnia   Relevant Medications   traZODone (DESYREL) 100 MG tablet   Long term (current) use of anticoagulants   Relevant Orders   CBC with Differential/Platelet   Protime-INR   PTT   Other Visit Diagnoses     Encounter for smoking cessation counseling    -  Primary   Relevant Medications   buPROPion (WELLBUTRIN SR) 100 MG 12 hr tablet    Chronic anticoagulation       Relevant Medications   warfarin (COUMADIN) 5 MG tablet   Other Relevant Orders   CBC with Differential/Platelet   Comprehensive metabolic panel   History of anaphylaxis       Relevant Medications   EPINEPHrine 0.3 mg/0.3 mL IJ SOAJ injection   Screening for viral disease       Relevant Orders   Hepatitis C Antibody   HIV antibody (with reflex)   Lipid screening       Relevant Orders   Lipid panel   Screening for endocrine, metabolic and immunity disorder       Relevant Orders   Hemoglobin A1c   TSH   Encounter for counseling regarding contraception       Relevant Orders   POCT Pregnancy, Urine       Meds ordered this encounter  Medications   buPROPion (WELLBUTRIN SR) 100 MG 12 hr tablet    Sig: Take 1 tablet (100 mg total) by mouth 2 (two) times daily.    Dispense:  180 tablet    Refill:  0    Order Specific Question:   Supervising Provider    Answer:   Carlota Raspberry, JEFFREY R [[id number]]  citalopram (CELEXA) 20 MG tablet    Sig: Take 1 tablet (20 mg total) by mouth daily.    Dispense:  90 tablet    Refill:  1    Order Specific Question:   Supervising Provider    Answer:   Carlota Raspberry, JEFFREY R [[id number]]   EPINEPHrine 0.3 mg/0.3 mL IJ SOAJ injection    Sig: USE AS DIRECTED WHEN NEEDED    Dispense:  1 each    Refill:  99    Order Specific Question:   Supervising Provider    Answer:   Carlota Raspberry, JEFFREY R [[id number]]   OLANZapine (ZYPREXA) 20 MG tablet    Sig: TAKE 1 TABLET BY MOUTH EVERYDAY AT BEDTIME    Dispense:  90 tablet    Refill:  1    Order Specific Question:   Supervising Provider    Answer:   Carlota Raspberry, JEFFREY R [[id number]]   traZODone (DESYREL) 100 MG tablet    Sig: TAKE 1 TABLET BY MOUTH EVERYDAY AT BEDTIME    Dispense:  90 tablet    Refill:  1    Order Specific Question:   Supervising Provider    Answer:   Carlota Raspberry, JEFFREY R [[id number]]   warfarin (COUMADIN) 5 MG tablet    Sig: TAKE 1 TABLET DAILY ON SUN,MON,WED,FRI,SAT. TAKES 7.5 MG ON OTHER DAYS.     Dispense:  96 tablet    Refill:  1    Order Specific Question:   Supervising Provider    Answer:   Carlota Raspberry, JEFFREY R [[id number]]    Follow-up: No follow-ups on file.   PLAN Start wellbutrin 100mg  SR po bid for smoking cessation. Discussed nonpharm methods. Continue other meds as rx'd Start depo injection, given return dates and precautions Refer to Dr. Percival Spanish for follow up  Labs collected. Will follow up with the patient as warranted. Return in 6 mo for med check Patient encouraged to call clinic with any questions, comments, or concerns.  Maximiano Coss, NP

## 2021-04-01 LAB — HEPATITIS C ANTIBODY
Hepatitis C Ab: NONREACTIVE
SIGNAL TO CUT-OFF: 0.01 (ref <1.00)

## 2021-04-01 LAB — LIPID PANEL
Cholesterol: 196 mg/dL (ref 0–200)
HDL: 48.9 mg/dL (ref >39.00)
LDL Cholesterol: 124 mg/dL — ABNORMAL HIGH (ref 0–99)
NonHDL: 146.95
Total CHOL/HDL Ratio: 4
Triglycerides: 117 mg/dL (ref 0.0–149.0)
VLDL: 23.4 mg/dL (ref 0.0–40.0)

## 2021-04-01 LAB — CBC WITH DIFFERENTIAL/PLATELET
Basophils Absolute: 0.1 10*3/uL (ref 0.0–0.1)
Basophils Relative: 0.8 % (ref 0.0–3.0)
Eosinophils Absolute: 0.1 10*3/uL (ref 0.0–0.7)
Eosinophils Relative: 0.6 % (ref 0.0–5.0)
HCT: 41 % (ref 36.0–46.0)
Hemoglobin: 14 g/dL (ref 12.0–15.0)
Lymphocytes Relative: 17.4 % (ref 12.0–46.0)
Lymphs Abs: 1.6 10*3/uL (ref 0.7–4.0)
MCHC: 34.2 g/dL (ref 30.0–36.0)
MCV: 89.2 fl (ref 78.0–100.0)
Monocytes Absolute: 0.5 10*3/uL (ref 0.1–1.0)
Monocytes Relative: 5.5 % (ref 3.0–12.0)
Neutro Abs: 6.8 10*3/uL (ref 1.4–7.7)
Neutrophils Relative %: 75.7 % (ref 43.0–77.0)
Platelets: 253 10*3/uL (ref 150.0–400.0)
RBC: 4.59 Mil/uL (ref 3.87–5.11)
RDW: 15.3 % (ref 11.5–15.5)
WBC: 8.9 10*3/uL (ref 4.0–10.5)

## 2021-04-01 LAB — COMPREHENSIVE METABOLIC PANEL
ALT: 11 U/L (ref 0–35)
AST: 20 U/L (ref 0–37)
Albumin: 4.3 g/dL (ref 3.5–5.2)
Alkaline Phosphatase: 52 U/L (ref 39–117)
BUN: 11 mg/dL (ref 6–23)
CO2: 25 mEq/L (ref 19–32)
Calcium: 8.9 mg/dL (ref 8.4–10.5)
Chloride: 106 mEq/L (ref 96–112)
Creatinine, Ser: 0.81 mg/dL (ref 0.40–1.20)
GFR: 88.24 mL/min (ref >60.00)
Glucose, Bld: 86 mg/dL (ref 70–99)
Potassium: 4 mEq/L (ref 3.5–5.1)
Sodium: 141 mEq/L (ref 135–145)
Total Bilirubin: 0.4 mg/dL (ref 0.2–1.2)
Total Protein: 6.7 g/dL (ref 6.0–8.3)

## 2021-04-01 LAB — TSH: TSH: 1.51 u[IU]/mL (ref 0.35–4.50)

## 2021-04-01 LAB — HIV ANTIBODY (ROUTINE TESTING W REFLEX): HIV 1&2 Ab, 4th Generation: NONREACTIVE

## 2021-04-01 LAB — PROTIME-INR
INR: 1.9 ratio — ABNORMAL HIGH (ref 0.8–1.0)
Prothrombin Time: 20.9 s — ABNORMAL HIGH (ref 9.6–13.1)

## 2021-04-01 LAB — HEMOGLOBIN A1C: Hgb A1c MFr Bld: 5.3 % (ref 4.6–6.5)

## 2021-04-01 LAB — APTT: aPTT: 49.9 s — ABNORMAL HIGH (ref 23.4–32.7)

## 2021-04-02 ENCOUNTER — Other Ambulatory Visit: Payer: Self-pay | Admitting: Registered Nurse

## 2021-04-02 DIAGNOSIS — Z7901 Long term (current) use of anticoagulants: Secondary | ICD-10-CM

## 2021-05-15 ENCOUNTER — Encounter: Payer: Self-pay | Admitting: Registered Nurse

## 2021-06-19 ENCOUNTER — Telehealth: Payer: Self-pay | Admitting: Registered Nurse

## 2021-06-19 NOTE — Telephone Encounter (Signed)
Called patient's pharmacy to see why patient could not fill her prescription. Prescription was run again by the pharmacy staff and it did go through. Called patient to inform, no answer. Left a voicemail message informing the patient that her prescription would be ready this afternoon.

## 2021-06-19 NOTE — Telephone Encounter (Signed)
Patient needs her warfarin refilled - pharmacy is telling her that it is too early for her refill.  Please advice - Patient states that she has been taking as prescribed and has only 1 left.  Pharmacy - [facility], [place]

## 2021-06-26 ENCOUNTER — Other Ambulatory Visit: Payer: Self-pay | Admitting: Registered Nurse

## 2021-06-26 DIAGNOSIS — Z716 Tobacco abuse counseling: Secondary | ICD-10-CM

## 2021-08-06 ENCOUNTER — Encounter: Payer: Self-pay | Admitting: Registered Nurse

## 2021-08-06 ENCOUNTER — Ambulatory Visit (INDEPENDENT_AMBULATORY_CARE_PROVIDER_SITE_OTHER): Payer: Managed Care, Other (non HMO) | Admitting: Registered Nurse

## 2021-08-06 ENCOUNTER — Other Ambulatory Visit: Payer: Self-pay

## 2021-08-06 VITALS — BP 125/86 | HR 96 | Temp 98.4°F | Resp 18 | Ht 74.0 in | Wt 166.4 lb

## 2021-08-06 DIAGNOSIS — Z23 Encounter for immunization: Secondary | ICD-10-CM

## 2021-08-06 DIAGNOSIS — F3160 Bipolar disorder, current episode mixed, unspecified: Secondary | ICD-10-CM

## 2021-08-06 DIAGNOSIS — R109 Unspecified abdominal pain: Secondary | ICD-10-CM | POA: Diagnosis not present

## 2021-08-06 DIAGNOSIS — M545 Low back pain, unspecified: Secondary | ICD-10-CM

## 2021-08-06 LAB — POCT URINALYSIS DIP (MANUAL ENTRY)
Bilirubin, UA: NEGATIVE
Glucose, UA: NEGATIVE mg/dL
Ketones, POC UA: NEGATIVE mg/dL
Nitrite, UA: NEGATIVE
Spec Grav, UA: <=1.005 — AB
Urobilinogen, UA: 0.2 U/dL
pH, UA: 7

## 2021-08-06 MED ORDER — CITALOPRAM HYDROBROMIDE 20 MG PO TABS: 20.0000 mg | ORAL_TABLET | Freq: Every day | ORAL | 1 refills | Status: DC

## 2021-08-06 MED ORDER — SULFAMETHOXAZOLE-TRIMETHOPRIM 800-160 MG PO TABS: 1.0000 | ORAL_TABLET | Freq: Two times a day (BID) | ORAL | 0 refills | Status: DC

## 2021-08-06 MED ORDER — CLONAZEPAM 0.5 MG PO TBDP: 0.5000 mg | ORAL_TABLET | Freq: Three times a day (TID) | ORAL | 0 refills | Status: DC | PRN

## 2021-08-06 MED ORDER — HYDROXYZINE HCL 10 MG PO TABS
10.0000 mg | ORAL_TABLET | Freq: Three times a day (TID) | ORAL | 0 refills | Status: DC | PRN
Start: 2021-08-06 — End: 2021-09-07

## 2021-08-06 MED ORDER — CITALOPRAM HYDROBROMIDE 10 MG PO TABS: 10.0000 mg | ORAL_TABLET | Freq: Every day | ORAL | 0 refills | Status: DC

## 2021-08-06 NOTE — Patient Instructions (Addendum)
[name] to see you!  Increase celexa to 30mg  daily.  Clonazepam and hydroxyzine sent.  Bactrim for suspected UTI.  See you in 4 weeks to touch base  Thanks,  Rich     If you have lab work done today you will be contacted with your lab results within the next 2 weeks.  If you have not heard from [name] then please contact us. The fastest way to get your results is to register for My Chart.   IF you received an x-ray today, you will receive an invoice from [facility]. Please contact [facility] at [phone] with questions or concerns regarding your invoice.   IF you received labwork today, you will receive an invoice from [facility]. Please contact [facility] at [phone] with questions or concerns regarding your invoice.   Our billing staff will not be able to assist you with questions regarding bills from these companies.  You will be contacted with the lab results as soon as they are available. The fastest way to get your results is to activate your My Chart account. Instructions are located on the last page of this paperwork. If you have not heard from [name] regarding the results in 2 weeks, please contact this office.

## 2021-08-06 NOTE — Progress Notes (Signed)
Established Patient Office Visit  Subjective:  Patient ID: Melissa Hayden, female    DOB: 07-18-1976  Age: 45 y.o. MRN: [id number]  CC:  Chief Complaint  Patient presents with   Anxiety    Patient states she has been having more mental issues and can not determine if its anxiety and depression.and also some lower back pain     HPI Melissa Hayden presents for worsening anxiety and back pain  Anxiety Acute deterioration depression and anxiety Having trouble cleaning her home, herself Missing work  No triggers to this Feels like it is more anxiety than depression. Denies hi/si.  Back pain Flank, bilateral No injury or trauma that she's aware of No comfortable position No urinary or constitutional symptoms.  Past Medical History:  Diagnosis Date   Anticoagulant long-term use    after valve surgery for Marfan's syndrome   Anxiety    Ascending aorta dilatation (HCC)    Bipolar disorder (HCC)    Depression    Marfan's syndrome    Dr. Atilano Median   Migraine    Mitral valve prolapse    Palpitations    Suicidal behavior    Tylenol Overdose   Varicose veins     Past Surgical History:  Procedure Laterality Date   ANKLE SURGERY Left    BREAST CYST EXCISION     CARDIAC VALVE SURGERY  09/2014   AV, MV replacement, aortic root replacement (Dr Ysidro Evert at [facility])   ICD IMPLANT N/A 01/05/2018   Procedure: ICD IMPLANT;  Surgeon: Evans Lance, MD;  Location: [facility];  Service: Cardiovascular;  Laterality: N/A;   INNER EAR SURGERY Right     Family History  Problem Relation Age of Onset   Cancer Mother 88       Breast    Marfan syndrome Father    Heart disease Father    Marfan syndrome Sister    Marfan syndrome Paternal Grandmother    Marfan syndrome Sister     Social History   Socioeconomic History   Marital status: Divorced    Spouse name: Not on file   Number of children: 0   Years of education: 16   Highest education level: Not on file  Occupational History    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  Off and on    Alcohol Use:  None   Drug Use:  None   Diet:  Regular   Exercise:  None   Hobbies:  Reading, Traveling             Social Determinants of Health   Financial Resource Strain: Not on file  Food Insecurity: Not on file  Transportation Needs: Not on file  Physical Activity: Not on file  Stress: Not on file  Social Connections: Not on file  Intimate Partner Violence: Not  on file    Outpatient Medications Prior to Visit  Medication Sig Dispense Refill   acetaminophen (TYLENOL) 650 MG CR tablet Take 1,300 mg by mouth every 8 (eight) hours as needed (for headache/pain relief).     buPROPion ER (WELLBUTRIN SR) 100 MG 12 hr tablet TAKE 1 TABLET BY MOUTH TWICE A DAY 180 tablet 0   carvedilol (COREG) 6.25 MG tablet TAKE 1 TABLET BY MOUTH TWICE A DAY 180 tablet 3   EPINEPHrine 0.3 mg/0.3 mL IJ SOAJ injection USE AS DIRECTED WHEN NEEDED 1 each 99   Multiple Vitamin (MULTIVITAMIN WITH MINERALS) TABS tablet Take 1 tablet by mouth daily. Centrum Ultra Women's Oral     OLANZapine (ZYPREXA) 20 MG tablet TAKE 1 TABLET BY MOUTH EVERYDAY AT BEDTIME 90 tablet 1   traZODone (DESYREL) 100 MG tablet TAKE 1 TABLET BY MOUTH EVERYDAY AT BEDTIME 90 tablet 1   warfarin (COUMADIN) 5 MG tablet TAKE 1 TABLET DAILY ON SUN,MON,WED,FRI,SAT. TAKES 7.5 MG ON OTHER DAYS. 96 tablet 1   citalopram (CELEXA) 20 MG tablet Take 1 tablet (20 mg total) by mouth  daily. 90 tablet 1   No facility-administered medications prior to visit.    Not on File  ROS Review of Systems  Constitutional: Negative.   HENT: Negative.    Eyes: Negative.   Respiratory: Negative.    Cardiovascular: Negative.   Gastrointestinal: Negative.   Genitourinary:  Positive for flank pain.  Musculoskeletal:  Positive for back pain.  Skin: Negative.   Neurological: Negative.   Psychiatric/Behavioral: Negative.    All other systems reviewed and are negative.    Objective:    Physical Exam Vitals and nursing note reviewed.  Constitutional:      General: She is not in acute distress.    Appearance: Normal appearance. She is normal weight. She is not ill-appearing, toxic-appearing or diaphoretic.  Cardiovascular:     Rate and Rhythm: Normal rate and regular rhythm.     Heart sounds: Normal heart sounds. No murmur heard.   No friction rub. No gallop.  Pulmonary:     Effort: Pulmonary effort is normal. No respiratory distress.     Breath sounds: Normal breath sounds. No stridor. No wheezing, rhonchi or rales.  Chest:     Chest wall: No tenderness.  Skin:    General: Skin is warm and dry.  Neurological:     General: No focal deficit present.     Mental Status: She is alert and oriented to person, place, and time. Mental status is at baseline.  Psychiatric:        Mood and Affect: Mood normal.        Behavior: Behavior normal.        Thought Content: Thought content normal.        Judgment: Judgment normal.    BP 125/86   Pulse 96   Temp 98.4 F (36.9 C) (Temporal)   Resp 18   Ht 6\' 2"  (1.88 m)   Wt 166 lb 6.4 oz (75.5 kg)   BMI 21.36 kg/m  Wt Readings from Last 3 Encounters:  08/06/21 166 lb 6.4 oz (75.5 kg)  03/31/21 159 lb 3.2 oz (72.2 kg)  08/19/20 160 lb (72.6 kg)     Health Maintenance Due  Topic Date Due   COVID-19 Vaccine (3 - Booster) 03/06/2020   PAP SMEAR-Modifier  11/09/2020   INFLUENZA VACCINE  Never done    There are no  preventive care reminders to display for this patient.  Lab Results  Component Value Date   TSH 1.51 03/31/2021   Lab Results  Component Value Date   WBC 8.9 03/31/2021   HGB 14.0 03/31/2021   HCT 41.0 03/31/2021   MCV 89.2 03/31/2021   PLT 253.0 03/31/2021   Lab Results  Component Value Date   NA 141 03/31/2021   K 4.0 03/31/2021   CO2 25 03/31/2021   GLUCOSE 86 03/31/2021   BUN 11 03/31/2021   CREATININE 0.81 03/31/2021   BILITOT 0.4 03/31/2021   ALKPHOS 52 03/31/2021   AST 20 03/31/2021   ALT 11 03/31/2021   PROT 6.7 03/31/2021   ALBUMIN 4.3 03/31/2021   CALCIUM 8.9 03/31/2021   GFR 88.24 03/31/2021   Lab Results  Component Value Date   CHOL 196 03/31/2021   Lab Results  Component Value Date   HDL 48.90 03/31/2021   Lab Results  Component Value Date   LDLCALC 124 (H) 03/31/2021   Lab Results  Component Value Date   TRIG 117.0 03/31/2021   Lab Results  Component Value Date   CHOLHDL 4 03/31/2021   Lab Results  Component Value Date   HGBA1C 5.3 03/31/2021      Assessment & Plan:   Problem List Items Addressed This Visit       Other   Bipolar disorder, mixed (HCC) (Chronic)   Relevant Medications   citalopram (CELEXA) 10 MG tablet   citalopram (CELEXA) 20 MG tablet   hydrOXYzine (ATARAX/VISTARIL) 10 MG tablet   clonazePAM (KLONOPIN) 0.5 MG disintegrating tablet   Other Visit Diagnoses     Low back pain, unspecified back pain laterality, unspecified chronicity, unspecified whether sciatica present    -  Primary   Relevant Orders   POCT urinalysis dipstick (Completed)   Flu vaccine need       Relevant Orders   Flu Vaccine QUAD 6+ mos PF IM (Fluarix Quad PF)   Flank pain       Relevant Medications   sulfamethoxazole-trimethoprim (BACTRIM DS) 800-160 MG tablet   Other Relevant Orders   Urine Culture       Meds ordered this encounter  Medications   citalopram (CELEXA) 10 MG tablet    Sig: Take 1 tablet (10 mg total) by mouth daily.     Dispense:  90 tablet    Refill:  0    Order Specific Question:   Supervising Provider    Answer:   Carlota Raspberry, JEFFREY R [[id number]]   citalopram (CELEXA) 20 MG tablet    Sig: Take 1 tablet (20 mg total) by mouth daily.    Dispense:  90 tablet    Refill:  1    Order Specific Question:   Supervising Provider    Answer:   Carlota Raspberry, JEFFREY R [[id number]]   hydrOXYzine (ATARAX/VISTARIL) 10 MG tablet    Sig: Take 1 tablet (10 mg total) by mouth 3 (three) times daily as needed.    Dispense:  30 tablet    Refill:  0    Order Specific Question:   Supervising Provider    Answer:   Carlota Raspberry, JEFFREY R [[id number]]   clonazePAM (KLONOPIN) 0.5 MG disintegrating tablet    Sig: Take 1 tablet (0.5 mg total) by mouth 3 (three) times daily as needed for seizure.    Dispense:  30 tablet    Refill:  0    Order Specific Question:   Supervising Provider    Answer:   Carlota Raspberry, JEFFREY R [[id number]]   sulfamethoxazole-trimethoprim (BACTRIM DS) 800-160  MG tablet    Sig: Take 1 tablet by mouth 2 (two) times daily.    Dispense:  6 tablet    Refill:  0    Order Specific Question:   Supervising Provider    Answer:   Carlota Raspberry, JEFFREY R [[id number]]    Follow-up: Return in about 4 weeks (around 09/03/2021) for med check.   PLAN Flank pain concern for UTI. Will treat with bactrim and nonpharm. Giving hydroxyzine and clonazepam for acute anxiety. Reviewed risks of these medications with patient who voices understanding. Increase celexa to 30mg  po qd. Return in 4 weeks for med check Patient encouraged to call clinic with any questions, comments, or concerns.  Maximiano Coss, NP

## 2021-08-07 LAB — URINE CULTURE
MICRO NUMBER:: 12560358
SPECIMEN QUALITY:: ADEQUATE

## 2021-08-08 ENCOUNTER — Other Ambulatory Visit: Payer: Self-pay | Admitting: Registered Nurse

## 2021-08-08 DIAGNOSIS — M545 Low back pain, unspecified: Secondary | ICD-10-CM

## 2021-08-08 MED ORDER — METHOCARBAMOL 500 MG PO TABS: 500.0000 mg | ORAL_TABLET | Freq: Two times a day (BID) | ORAL | 0 refills | Status: DC | PRN

## 2021-08-11 ENCOUNTER — Telehealth: Payer: Self-pay

## 2021-08-11 NOTE — Telephone Encounter (Signed)
Caller name:Stayce Vinnie Level   On DPR? :yes  Call back number:[phone]  Provider they see: Delfino Lovett   Reason for call:Pt dropped off FMLA forms and I placed in [place]

## 2021-08-17 ENCOUNTER — Encounter: Payer: Self-pay | Admitting: Registered Nurse

## 2021-08-19 ENCOUNTER — Other Ambulatory Visit: Payer: Self-pay | Admitting: Registered Nurse

## 2021-08-19 DIAGNOSIS — G43709 Chronic migraine without aura, not intractable, without status migrainosus: Secondary | ICD-10-CM

## 2021-08-19 MED ORDER — RIZATRIPTAN BENZOATE 10 MG PO TABS: 10.0000 mg | ORAL_TABLET | ORAL | 0 refills | Status: DC | PRN

## 2021-08-20 ENCOUNTER — Other Ambulatory Visit: Payer: Self-pay | Admitting: Registered Nurse

## 2021-08-20 DIAGNOSIS — F3160 Bipolar disorder, current episode mixed, unspecified: Secondary | ICD-10-CM

## 2021-08-21 MED ORDER — CLONAZEPAM 0.5 MG PO TBDP: 0.5000 mg | ORAL_TABLET | Freq: Three times a day (TID) | ORAL | 0 refills | Status: DC | PRN

## 2021-08-21 NOTE — Telephone Encounter (Signed)
Requesting:Klonopin 0.5mg  Contract: UDS: Last Visit:08/06/21 Next Visit:08/31/21 Last Refill:08/06/21 30 tabs 0 refills  Please Advise

## 2021-08-25 ENCOUNTER — Encounter: Payer: Self-pay | Admitting: Registered Nurse

## 2021-08-26 NOTE — Telephone Encounter (Signed)
Patient paperwork was successfully faxed.

## 2021-08-26 NOTE — Telephone Encounter (Signed)
I am currently re-faxing the paperwork to the patients employer.

## 2021-08-31 ENCOUNTER — Telehealth (INDEPENDENT_AMBULATORY_CARE_PROVIDER_SITE_OTHER): Payer: Managed Care, Other (non HMO) | Admitting: Registered Nurse

## 2021-08-31 ENCOUNTER — Other Ambulatory Visit: Payer: Self-pay

## 2021-08-31 ENCOUNTER — Encounter: Payer: Self-pay | Admitting: Registered Nurse

## 2021-08-31 VITALS — Wt 164.0 lb

## 2021-08-31 DIAGNOSIS — F5101 Primary insomnia: Secondary | ICD-10-CM

## 2021-08-31 DIAGNOSIS — I5022 Chronic systolic (congestive) heart failure: Secondary | ICD-10-CM

## 2021-08-31 DIAGNOSIS — F3160 Bipolar disorder, current episode mixed, unspecified: Secondary | ICD-10-CM

## 2021-08-31 NOTE — Progress Notes (Signed)
Telemedicine Encounter- SOAP NOTE Established Patient  This video encounter was conducted with the patient's (or proxy's) verbal consent via audio telecommunications: yes/no: Yes Patient was instructed to have this encounter in a suitably private space; and to only have persons present to whom they give permission to participate. In addition, patient identity was confirmed by use of name plus two identifiers (DOB and address).  I discussed the limitations, risks, security and privacy concerns of performing an evaluation and management service by telephone and the availability of in person appointments. I also discussed with the patient that there may be a patient responsible charge related to this service. The patient expressed understanding and agreed to proceed.  I spent a total of 17 minutes talking with the patient or their proxy.  Patient at home Provider in office  Participants: Melissa Ruddy, NP and Eligha Bridegroom  Chief Complaint  Patient presents with   Follow-up    Patient states she is following up on mental health. Patient states she has no other concerns at this time.    Subjective   Melissa Hayden is a 45 y.o. established patient. Video visit today for follow up   HPI Hx of anxiety, depression, bipolar disorder.  Had been on celexa 20mg  po qd, zyprexa 20mg  po qd, trazodone 100mg  po qhs, wellbutrin 100mg  SR po qd as of visit on 03/31/21 Seen on 08/06/21 for worsening anxiety - increased celexa to 30mg  po qd, added clonazepam 0.5mg  sublingual bid prn, hydroxyzine 10mg  po tid prn.   Notes anxiety worsening, panic attack at grocery store over weekend. Chest tightness, trouble breathing, acute anxiety. Panic attack lasted 15-20 minutes. Didn't have meds with her. When getting home she did take clonazepam with some relief.   Denies hi/si. Sleep has changed - feels like she is sleeping hardly at all or sleeping for 12 hour stretches Per FMLA - continues her absence from work.    In the past has been on lexapro - subtherapeutic.   In the past, was on depakote for migraines. Had used abilify - experienced AE and had to stop within 2 weeks.   Patient Active Problem List   Diagnosis Date Noted   Syncope and collapse 38/18/2993   Chronic systolic (congestive) heart failure ([place]) 01/05/2018   Nonischemic cardiomyopathy ([place]) 11/16/2017   Irregular cardiac rhythm 11/07/2017   Visit for preventive health examination 11/07/2017   Breast cancer screening 11/07/2017   History of syncope 11/03/2017   Multifocal PVCs with pairing 11/03/2017   Long term (current) use of anticoagulants 10/03/2017   History of aortic stenosis 03/18/2017   Suicide attempt by drug ingestion ([place]) 03/18/2017   Insomnia 12/12/2014   Marfan's disease 06/13/2014   Chronic venous insufficiency 06/13/2014   Spinal cord cysts 06/13/2014   Bipolar disorder, mixed ([place]) 02/10/2014    Past Medical History:  Diagnosis Date   Anticoagulant long-term use    after valve surgery for Marfan's syndrome   Anxiety    Ascending aorta dilatation (HCC)    Bipolar disorder (HCC)    Depression    Marfan's syndrome    Dr. Atilano Median   Migraine    Mitral valve prolapse    Palpitations    Suicidal behavior    Tylenol Overdose   Varicose veins     Current Outpatient Medications  Medication Sig Dispense Refill   acetaminophen (TYLENOL) 650 MG CR tablet Take 1,300 mg by mouth every 8 (eight) hours as needed (for headache/pain relief).  buPROPion ER (WELLBUTRIN SR) 100 MG 12 hr tablet TAKE 1 TABLET BY MOUTH TWICE A DAY 180 tablet 0   carvedilol (COREG) 6.25 MG tablet TAKE 1 TABLET BY MOUTH TWICE A DAY 180 tablet 3   citalopram (CELEXA) 10 MG tablet Take 1 tablet (10 mg total) by mouth daily. 90 tablet 0   citalopram (CELEXA) 20 MG tablet Take 1 tablet (20 mg total) by mouth daily. 90 tablet 1   clonazePAM (KLONOPIN) 0.5 MG disintegrating tablet Take 1 tablet (0.5 mg total) by mouth 3 (three) times daily  as needed for seizure. 30 tablet 0   EPINEPHrine 0.3 mg/0.3 mL IJ SOAJ injection USE AS DIRECTED WHEN NEEDED 1 each 99   hydrOXYzine (ATARAX/VISTARIL) 10 MG tablet Take 1 tablet (10 mg total) by mouth 3 (three) times daily as needed. 30 tablet 0   Multiple Vitamin (MULTIVITAMIN WITH MINERALS) TABS tablet Take 1 tablet by mouth daily. Centrum Ultra Women's Oral     OLANZapine (ZYPREXA) 20 MG tablet TAKE 1 TABLET BY MOUTH EVERYDAY AT BEDTIME 90 tablet 1   rizatriptan (MAXALT) 10 MG tablet Take 1 tablet (10 mg total) by mouth as needed for migraine. May repeat in 2 hours if needed 10 tablet 0   sulfamethoxazole-trimethoprim (BACTRIM DS) 800-160 MG tablet Take 1 tablet by mouth 2 (two) times daily. 6 tablet 0   traZODone (DESYREL) 100 MG tablet TAKE 1 TABLET BY MOUTH EVERYDAY AT BEDTIME 90 tablet 1   warfarin (COUMADIN) 5 MG tablet TAKE 1 TABLET DAILY ON SUN,MON,WED,FRI,SAT. TAKES 7.5 MG ON OTHER DAYS. 96 tablet 1   methocarbamol (ROBAXIN) 500 MG tablet Take 1 tablet (500 mg total) by mouth 2 (two) times daily as needed for muscle spasms. 30 tablet 0   No current facility-administered medications for this visit.    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  Off and on    Alcohol Use:  None   Drug Use:  None   Diet:  Regular   Exercise:  None   Hobbies:  Reading, Traveling             Social Determinants of Health   Financial Resource Strain: Not on file  Food Insecurity: Not on file  Transportation Needs: Not on file  Physical Activity: Not on file  Stress: Not on file  Social Connections: Not on file  Intimate Partner Violence: Not on file    Review of  Systems  Constitutional: Negative.   HENT: Negative.    Eyes: Negative.   Respiratory: Negative.    Cardiovascular: Negative.   Gastrointestinal: Negative.   Genitourinary: Negative.   Musculoskeletal: Negative.   Skin: Negative.   Neurological: Negative.   Endo/Heme/Allergies: Negative.   Psychiatric/Behavioral:  Positive for depression. Negative for hallucinations, memory loss, substance abuse and suicidal ideas. The patient is nervous/anxious and has insomnia.   All other systems reviewed and are negative.  Objective   Vitals as reported by the patient: Today's Vitals   08/31/21 1232  Weight: 164 lb (74.4 kg)    Aris was seen today for follow-up.  Diagnoses and all orders for this visit:  Chronic systolic (congestive) heart failure (HCC)  Bipolar disorder, mixed ([place])  Primary insomnia   PLAN Will plan to change SSRI. Taper celexa with 20mg  x 4 days, 10mg  x 4 days, then stop.  Would like to start fluoxetine as available data suggests this would be good pairing with zyprexa, but concerned for cardiovascular risk. Unsure that she is particularly at risk for this given her cardiac stability and the fact her cardiovascular disease is secondary to Marfan's Syndrome. Will CC Drs. Caryl Comes and Lovena Le, who she has seen in the past, to get their opinion. If not starting fluoxetine, will plan for sertraline.  Discussed risks, benefits, alternatives, AE  with patient who voices understanding. Questions answered and concerns addressed.  Consider referral to psychiatry if symptoms persistent or worsening. Reviewed emergency behavioral health resources with patient. Patient encouraged to call clinic with any questions, comments, or concerns.  I discussed the assessment and treatment plan with the patient. The patient was provided an opportunity to ask questions and all were answered. The patient agreed with the plan and demonstrated an understanding of the instructions.   The patient was advised to call back or seek an in-person evaluation if the symptoms worsen or if the condition fails to improve as anticipated.  I provided 17 minutes of face-to-face time during this encounter.  Maximiano Coss, NP

## 2021-09-01 ENCOUNTER — Other Ambulatory Visit: Payer: Self-pay | Admitting: Registered Nurse

## 2021-09-01 ENCOUNTER — Encounter: Payer: Self-pay | Admitting: Registered Nurse

## 2021-09-01 DIAGNOSIS — F3160 Bipolar disorder, current episode mixed, unspecified: Secondary | ICD-10-CM

## 2021-09-01 MED ORDER — FLUOXETINE HCL 20 MG PO CAPS: 20.0000 mg | ORAL_CAPSULE | Freq: Every day | ORAL | 0 refills | Status: DC

## 2021-09-07 ENCOUNTER — Other Ambulatory Visit: Payer: Self-pay | Admitting: Registered Nurse

## 2021-09-07 DIAGNOSIS — F3160 Bipolar disorder, current episode mixed, unspecified: Secondary | ICD-10-CM

## 2021-09-08 ENCOUNTER — Encounter: Payer: Self-pay | Admitting: Registered Nurse

## 2021-09-08 MED ORDER — CLONAZEPAM 0.5 MG PO TBDP: 0.5000 mg | ORAL_TABLET | Freq: Three times a day (TID) | ORAL | 0 refills | Status: DC | PRN

## 2021-09-08 MED ORDER — HYDROXYZINE HCL 10 MG PO TABS: 10.0000 mg | ORAL_TABLET | Freq: Three times a day (TID) | ORAL | 0 refills | Status: DC | PRN

## 2021-09-09 ENCOUNTER — Other Ambulatory Visit: Payer: Self-pay | Admitting: Registered Nurse

## 2021-09-09 DIAGNOSIS — F5101 Primary insomnia: Secondary | ICD-10-CM

## 2021-09-09 DIAGNOSIS — Z7901 Long term (current) use of anticoagulants: Secondary | ICD-10-CM

## 2021-09-09 MED ORDER — WARFARIN SODIUM 5 MG PO TABS: ORAL_TABLET | ORAL | 1 refills | Status: DC

## 2021-09-09 MED ORDER — TRAZODONE HCL 100 MG PO TABS: 100.0000 mg | ORAL_TABLET | Freq: Every day | ORAL | 1 refills | Status: DC

## 2021-09-15 ENCOUNTER — Other Ambulatory Visit: Payer: Self-pay | Admitting: Registered Nurse

## 2021-09-15 DIAGNOSIS — G43709 Chronic migraine without aura, not intractable, without status migrainosus: Secondary | ICD-10-CM

## 2021-09-20 ENCOUNTER — Other Ambulatory Visit: Payer: Self-pay | Admitting: Registered Nurse

## 2021-09-20 DIAGNOSIS — F3160 Bipolar disorder, current episode mixed, unspecified: Secondary | ICD-10-CM

## 2021-09-24 ENCOUNTER — Telehealth: Payer: Managed Care, Other (non HMO) | Admitting: Registered Nurse

## 2021-09-25 ENCOUNTER — Telehealth (INDEPENDENT_AMBULATORY_CARE_PROVIDER_SITE_OTHER): Payer: Managed Care, Other (non HMO) | Admitting: Registered Nurse

## 2021-09-25 DIAGNOSIS — F3160 Bipolar disorder, current episode mixed, unspecified: Secondary | ICD-10-CM

## 2021-09-25 MED ORDER — VORTIOXETINE HBR 20 MG PO TABS: 10.0000 mg | ORAL_TABLET | Freq: Every day | ORAL | 1 refills | Status: DC

## 2021-09-25 NOTE — Progress Notes (Signed)
Telemedicine Encounter- SOAP NOTE Established Patient  This video encounter was conducted with the patient's (or proxy's) verbal consent via audio telecommunications: yes/no: Yes Patient was instructed to have this encounter in a suitably private space; and to only have persons present to whom they give permission to participate. In addition, patient identity was confirmed by use of name plus two identifiers (DOB and address).  I discussed the limitations, risks, security and privacy concerns of performing an evaluation and management service by telephone and the availability of in person appointments. I also discussed with the patient that there may be a patient responsible charge related to this service. The patient expressed understanding and agreed to proceed.  I spent a total of 17 minutes talking with the patient or their proxy.  Patient at home Provider in office  Participants: Kathrin Ruddy, NP and Eligha Bridegroom  No chief complaint on file.   Subjective   Melissa Hayden is a 45 y.o. established patient. Video visit today for follow up   HPI We have been trying to pursue a combination of fluoxetine and olanzapine.  Unfortunately things have not been going well. Feeling very tired, very depressed.  She has been unable to get anything done.  Appetite has disappeared.   Fluoxetine has been new as of 3 weeks ago.  Olanzapine has been steady for some time - many years.   Interested in other options and referral to psychiatry  She denies HI/SI  Interested in short term disability. Thinking 1 mo absence would be ideal., Has been out of work since last Thursday, 09/17/21  Patient Active Problem List   Diagnosis Date Noted   Syncope and collapse 37/01/8888   Chronic systolic (congestive) heart failure ([place]) 01/05/2018   Nonischemic cardiomyopathy ([place]) 11/16/2017   Irregular cardiac rhythm 11/07/2017   Visit for preventive health examination 11/07/2017   Breast cancer  screening 11/07/2017   History of syncope 11/03/2017   Multifocal PVCs with pairing 11/03/2017   Long term (current) use of anticoagulants 10/03/2017   History of aortic stenosis 03/18/2017   Suicide attempt by drug ingestion ([place]) 03/18/2017   Insomnia 12/12/2014   Marfan's disease 06/13/2014   Chronic venous insufficiency 06/13/2014   Spinal cord cysts 06/13/2014   Bipolar disorder, mixed ([place]) 02/10/2014    Past Medical History:  Diagnosis Date   Anticoagulant long-term use    after valve surgery for Marfan's syndrome   Anxiety    Ascending aorta dilatation (HCC)    Bipolar disorder (HCC)    Depression    Marfan's syndrome    Dr. Atilano Median   Migraine    Mitral valve prolapse    Palpitations    Suicidal behavior    Tylenol Overdose   Varicose veins     Current Outpatient Medications  Medication Sig Dispense Refill   vortioxetine HBr (TRINTELLIX) 20 MG TABS tablet Take 0.5 tablets (10 mg total) by mouth daily. 30 tablet 1   acetaminophen (TYLENOL) 650 MG CR tablet Take 1,300 mg by mouth every 8 (eight) hours as needed (for headache/pain relief).     carvedilol (COREG) 6.25 MG tablet TAKE 1 TABLET BY MOUTH TWICE A DAY 180 tablet 3   clonazePAM (KLONOPIN) 0.5 MG disintegrating tablet Take 1 tablet (0.5 mg total) by mouth 3 (three) times daily as needed for seizure. 30 tablet 0   EPINEPHrine 0.3 mg/0.3 mL IJ SOAJ injection USE AS DIRECTED WHEN NEEDED 1 each 99   hydrOXYzine (ATARAX) 10 MG tablet Take  1 tablet (10 mg total) by mouth 3 (three) times daily as needed. 30 tablet 0   Multiple Vitamin (MULTIVITAMIN WITH MINERALS) TABS tablet Take 1 tablet by mouth daily. Centrum Ultra Women's Oral     OLANZapine (ZYPREXA) 20 MG tablet TAKE 1 TABLET BY MOUTH EVERYDAY AT BEDTIME 90 tablet 1   rizatriptan (MAXALT) 10 MG tablet TAKE 1 TABLET BY MOUTH AS NEEDED FOR MIGRAINE. MAY REPEAT IN 2 HOURS IF NEEDED 10 tablet 0   sulfamethoxazole-trimethoprim (BACTRIM DS) 800-160 MG tablet Take 1  tablet by mouth 2 (two) times daily. 6 tablet 0   traZODone (DESYREL) 100 MG tablet Take 1-1.5 tablets (100-150 mg total) by mouth at bedtime. TAKE 1 TABLET BY MOUTH EVERYDAY AT BEDTIME 135 tablet 1   warfarin (COUMADIN) 5 MG tablet TAKE 1 TABLET DAILY ON SUN,MON,WED,FRI,SAT. TAKES 7.5 MG ON TU and THU 124 tablet 1   No current facility-administered medications for this visit.    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  Off and on    Alcohol Use:  None   Drug Use:  None   Diet:  Regular   Exercise:  None   Hobbies:  Reading, Traveling             Social Determinants of Health   Financial Resource Strain: Not on file  Food Insecurity: Not on file  Transportation Needs: Not on file  Physical Activity: Not on file  Stress: Not on file  Social Connections: Not on file  Intimate Partner Violence: Not on file    ROS Per hpi   Objective   Vitals as reported by the patient: There were no vitals filed for this visit.  Diagnoses and all orders for this visit:  Bipolar disorder, mixed ([place]) -     vortioxetine HBr  (TRINTELLIX) 20 MG TABS tablet; Take 0.5 tablets (10 mg total) by mouth daily. -     Ambulatory referral to Psychiatry    PLAN Stop fluoxetine. Start trintellix Refer to psych Can give 1 mo leave from start date of trintellix Patient encouraged to call clinic with any questions, comments, or concerns.  I discussed the assessment and treatment plan with the patient. The patient was provided an opportunity to ask questions and all were answered. The patient agreed with the plan and demonstrated an understanding of the instructions.   The patient was advised to call back or seek an in-person evaluation if  the symptoms worsen or if the condition fails to improve as anticipated.  I provided 17 minutes of face-to-face time during this encounter.  Maximiano Coss, NP

## 2021-09-30 ENCOUNTER — Other Ambulatory Visit: Payer: Self-pay | Admitting: Registered Nurse

## 2021-09-30 DIAGNOSIS — F3160 Bipolar disorder, current episode mixed, unspecified: Secondary | ICD-10-CM

## 2021-10-01 MED ORDER — CLONAZEPAM 0.5 MG PO TBDP: 0.5000 mg | ORAL_TABLET | Freq: Three times a day (TID) | ORAL | 0 refills | Status: DC | PRN

## 2021-10-01 NOTE — Telephone Encounter (Signed)
Patient is requesting a refill of the following medications: Requested Prescriptions   Pending Prescriptions Disp Refills   clonazePAM (KLONOPIN) 0.5 MG disintegrating tablet 30 tablet 0    Sig: Take 1 tablet (0.5 mg total) by mouth 3 (three) times daily as needed for seizure.    Date of patient request: 09/30/2021 Last office visit: 09/25/2021 mychart Date of last refill: 09/08/2021 Last refill amount: 30 tablets Follow up time period per chart: n/a

## 2021-10-06 ENCOUNTER — Telehealth: Payer: Self-pay | Admitting: Registered Nurse

## 2021-10-06 ENCOUNTER — Encounter: Payer: Self-pay | Admitting: Registered Nurse

## 2021-10-06 NOTE — Telephone Encounter (Signed)
I have received Leave of absence forms by email for the pt. I have printed and placed them up front in the bin with a charge sheet.   The forms are due by 10/13/21

## 2021-10-06 NOTE — Telephone Encounter (Signed)
Have you seen this paperwork by any chance ?

## 2021-10-07 NOTE — Telephone Encounter (Signed)
Patient paperwork in the back folder to be completed.

## 2021-10-09 DIAGNOSIS — Z0279 Encounter for issue of other medical certificate: Secondary | ICD-10-CM

## 2021-10-12 ENCOUNTER — Other Ambulatory Visit: Payer: Self-pay | Admitting: Registered Nurse

## 2021-10-12 DIAGNOSIS — G43709 Chronic migraine without aura, not intractable, without status migrainosus: Secondary | ICD-10-CM

## 2021-10-15 NOTE — Telephone Encounter (Signed)
Forms have been faxed to # provided on the forms and sent to scan

## 2021-10-19 ENCOUNTER — Encounter: Payer: Self-pay | Admitting: Registered Nurse

## 2021-10-19 DIAGNOSIS — F3160 Bipolar disorder, current episode mixed, unspecified: Secondary | ICD-10-CM

## 2021-10-20 MED ORDER — VORTIOXETINE HBR 20 MG PO TABS: 10.0000 mg | ORAL_TABLET | Freq: Every day | ORAL | 1 refills | Status: DC

## 2021-10-21 ENCOUNTER — Encounter: Payer: Self-pay | Admitting: Registered Nurse

## 2021-10-22 NOTE — Telephone Encounter (Signed)
I attempted to call patients disability rep Candace at [phone] at 4:20pm to discuss information or concerns about the patients paperwork. I have LVM for her to return the call.

## 2021-10-23 ENCOUNTER — Telehealth: Payer: Self-pay

## 2021-10-23 ENCOUNTER — Encounter: Payer: Managed Care, Other (non HMO) | Admitting: Physician Assistant

## 2021-10-23 NOTE — Telephone Encounter (Signed)
Error

## 2021-10-23 NOTE — Progress Notes (Deleted)
Cardiology Office Note Date:  10/23/2021  Patient ID:  Melissa Hayden, DOB 11-03-1975, MRN [id number] PCP:  Maximiano Coss, NP  Cardiologist:  *** Electrophysiologist: Dr. Caryl Comes  ***refresh   Chief Complaint: ***  History of Present Illness: Melissa Hayden is a 46 y.o. female with history of Marfan syndrome, VHD (severe MR) s/p Bentall procedure with a mechanical aortic and mitral valve, depression, Bipolar d/o,  long hx of syncope w/palpitations > NSVT/PVCs, NICM  She has not had [facility] follow up since her implant wound check with device clinic RN AF episode sat that visit described as PVCs  I don't find cardiac care anywhere. No remotes  *** symptoms, syncope *** who follows her? *** echos? *** INRs? bleeding *** CM, meds, volume *** syncope? *** VT?    Device information MDT single chamber ICD implanted 01/05/2018 (Dr. Lovena Le) + DFTs   Past Medical History:  Diagnosis Date   Anticoagulant long-term use    after valve surgery for Marfan's syndrome   Anxiety    Ascending aorta dilatation (HCC)    Bipolar disorder (HCC)    Depression    Marfan's syndrome    Dr. Atilano Median   Migraine    Mitral valve prolapse    Palpitations    Suicidal behavior    Tylenol Overdose   Varicose veins     Past Surgical History:  Procedure Laterality Date   ANKLE SURGERY Left    BREAST CYST EXCISION     CARDIAC VALVE SURGERY  09/2014   AV, MV replacement, aortic root replacement (Dr Ysidro Evert at [facility])   ICD IMPLANT N/A 01/05/2018   Procedure: ICD IMPLANT;  Surgeon: Evans Lance, MD;  Location: [facility] [facility];  Service: Cardiovascular;  Laterality: N/A;   INNER EAR SURGERY Right     Current Outpatient Medications  Medication Sig Dispense Refill   acetaminophen (TYLENOL) 650 MG CR tablet Take 1,300 mg by mouth every 8 (eight) hours as needed (for headache/pain relief).     carvedilol (COREG) 6.25 MG tablet TAKE 1 TABLET BY MOUTH TWICE A DAY 180 tablet 3   clonazePAM (KLONOPIN)  0.5 MG disintegrating tablet Take 1 tablet (0.5 mg total) by mouth 3 (three) times daily as needed for seizure. 30 tablet 0   EPINEPHrine 0.3 mg/0.3 mL IJ SOAJ injection USE AS DIRECTED WHEN NEEDED 1 each 99   hydrOXYzine (ATARAX) 10 MG tablet Take 1 tablet (10 mg total) by mouth 3 (three) times daily as needed. 30 tablet 0   Multiple Vitamin (MULTIVITAMIN WITH MINERALS) TABS tablet Take 1 tablet by mouth daily. Centrum Ultra Women's Oral     OLANZapine (ZYPREXA) 20 MG tablet TAKE 1 TABLET BY MOUTH EVERYDAY AT BEDTIME 90 tablet 1   rizatriptan (MAXALT) 10 MG tablet TAKE 1 TABLET BY MOUTH AS NEEDED FOR MIGRAINE. MAY REPEAT IN 2 HOURS IF NEEDED 10 tablet 0   sulfamethoxazole-trimethoprim (BACTRIM DS) 800-160 MG tablet Take 1 tablet by mouth 2 (two) times daily. 6 tablet 0   traZODone (DESYREL) 100 MG tablet Take 1-1.5 tablets (100-150 mg total) by mouth at bedtime. TAKE 1 TABLET BY MOUTH EVERYDAY AT BEDTIME 135 tablet 1   vortioxetine HBr (TRINTELLIX) 20 MG TABS tablet Take 0.5 tablets (10 mg total) by mouth daily. 30 tablet 1   warfarin (COUMADIN) 5 MG tablet TAKE 1 TABLET DAILY ON SUN,MON,WED,FRI,SAT. TAKES 7.5 MG ON TU and THU 124 tablet 1   No current facility-administered medications for this visit.    Allergies:  Abilify [aripiprazole]   Social History:  The patient  reports that she has been smoking cigarettes. She has a 18.00 pack-year smoking history. She uses smokeless tobacco. She reports that she does not drink alcohol and does not use drugs.   Family History:  The patient's family history includes Cancer (age of onset: 33) in her mother; Heart disease in her father; Marfan syndrome in her father, paternal grandmother, sister, and sister.  ROS:  Please see the history of present illness.    All other systems are reviewed and otherwise negative.   PHYSICAL EXAM:  VS:  There were no vitals taken for this visit. BMI: There is no height or weight on file to calculate BMI. Well  nourished, well developed, in no acute distress HEENT: normocephalic, atraumatic Neck: no JVD, carotid bruits or masses Cardiac:  *** RRR; no significant murmurs, no rubs, or gallops Lungs:  *** CTA b/l, no wheezing, rhonchi or rales Abd: soft, nontender MS: no deformity or *** atrophy Ext: *** no edema Skin: warm and dry, no rash Neuro:  No gross deficits appreciated Psych: euthymic mood, full affect  *** ICD site is stable, no tethering or discomfort   EKG:  Done today and reviewed by myself shows  ***  Device interrogation done today and reviewed by myself:  ***   11/14/2017: TTE Study Conclusions  - Left ventricle: The cavity size was mildly dilated. Wall    thickness was normal. Systolic function was moderately to    severely reduced. The estimated ejection fraction was in the    range of 30% to 35%. Severe diffuse hypokinesis with no    identifiable regional variations. The study is not technically    sufficient to allow evaluation of LV diastolic function.  - Ventricular septum: Septal motion showed paradox.  - Aortic valve: A mechanical prosthesis was present and functioning    normally.  - Mitral valve: A mechanical prosthesis was present and functioning    normally.  - Left atrium: The atrium was moderately dilated.   Recent Labs: 03/31/2021: ALT 11; BUN 11; Creatinine, Ser 0.81; Hemoglobin 14.0; Platelets 253.0; Potassium 4.0; Sodium 141; TSH 1.51  03/31/2021: Cholesterol 196; HDL 48.90; LDL Cholesterol 124; Total CHOL/HDL Ratio 4; Triglycerides 117.0; VLDL 23.4   CrCl cannot be calculated (Patient's most recent lab result is older than the maximum 21 days allowed.).   Wt Readings from Last 3 Encounters:  08/31/21 164 lb (74.4 kg)  08/06/21 166 lb 6.4 oz (75.5 kg)  03/31/21 159 lb 3.2 oz (72.2 kg)     Other studies reviewed: Additional studies/records reviewed today include: summarized above  ASSESSMENT AND PLAN:  ICD ***  NICM ***  Marfan  syndrome Enlarged AO VHD S/p Bentall procedure with a mechanical aortic and mitral valve 2015 On warfarin *** managed by ***  5. PVCs, NSVT ***  6. Syncope ***  Disposition: F/u with ***  Current medicines are reviewed at length with the patient today.  The patient did not have any concerns regarding medicines.  Venetia Night, PA-C 10/23/2021 5:22 AM     [facility] [place] [address] [phone] (office)  [phone] (fax)

## 2021-10-23 NOTE — Telephone Encounter (Signed)
Melissa Hayden returned Melissa Hayden call regarding disability [phone] she stated that she will be off Monday for the Holiday and if call can not be returned today to reach out Tuesday

## 2021-10-25 ENCOUNTER — Other Ambulatory Visit: Payer: Self-pay | Admitting: Registered Nurse

## 2021-10-25 DIAGNOSIS — F3160 Bipolar disorder, current episode mixed, unspecified: Secondary | ICD-10-CM

## 2021-10-26 MED ORDER — CLONAZEPAM 0.5 MG PO TBDP: 0.5000 mg | ORAL_TABLET | Freq: Three times a day (TID) | ORAL | 0 refills | Status: DC | PRN

## 2021-10-28 ENCOUNTER — Encounter: Payer: Managed Care, Other (non HMO) | Admitting: Student

## 2021-10-28 NOTE — Progress Notes (Deleted)
Electrophysiology Office Note Date: 10/28/2021  ID:  Melissa Hayden, DOB 07/02/1976, MRN [id number]  PCP: Maximiano Coss, NP Primary Cardiologist: None Electrophysiologist: Cristopher Peru, MD   CC: Routine ICD follow-up  Melissa Hayden is a 46 y.o. female seen today for Cristopher Peru, MD for routine electrophysiology followup.  Since last being seen in our clinic the patient reports doing ***.  she denies chest pain, palpitations, dyspnea, PND, orthopnea, nausea, vomiting, dizziness, syncope, edema, weight gain, or early satiety. She has not had ICD shocks.   Device History: Medtronic Single Chamber ICD implanted 12/2017 for NICM  Past Medical History:  Diagnosis Date   Anticoagulant long-term use    after valve surgery for Marfan's syndrome   Anxiety    Ascending aorta dilatation (HCC)    Bipolar disorder (HCC)    Depression    Marfan's syndrome    Dr. Atilano Median   Migraine    Mitral valve prolapse    Palpitations    Suicidal behavior    Tylenol Overdose   Varicose veins    Past Surgical History:  Procedure Laterality Date   ANKLE SURGERY Left    BREAST CYST EXCISION     CARDIAC VALVE SURGERY  09/2014   AV, MV replacement, aortic root replacement (Dr Ysidro Evert at [facility])   ICD IMPLANT N/A 01/05/2018   Procedure: ICD IMPLANT;  Surgeon: Evans Lance, MD;  Location: [place] [facility];  Service: Cardiovascular;  Laterality: N/A;   INNER EAR SURGERY Right     Current Outpatient Medications  Medication Sig Dispense Refill   acetaminophen (TYLENOL) 650 MG CR tablet Take 1,300 mg by mouth every 8 (eight) hours as needed (for headache/pain relief).     carvedilol (COREG) 6.25 MG tablet TAKE 1 TABLET BY MOUTH TWICE A DAY 180 tablet 3   clonazePAM (KLONOPIN) 0.5 MG disintegrating tablet Take 1 tablet (0.5 mg total) by mouth 3 (three) times daily as needed for seizure. 30 tablet 0   EPINEPHrine 0.3 mg/0.3 mL IJ SOAJ injection USE AS DIRECTED WHEN NEEDED 1 each 99   hydrOXYzine (ATARAX)  10 MG tablet Take 1 tablet (10 mg total) by mouth 3 (three) times daily as needed. 30 tablet 0   Multiple Vitamin (MULTIVITAMIN WITH MINERALS) TABS tablet Take 1 tablet by mouth daily. Centrum Ultra Women's Oral     OLANZapine (ZYPREXA) 20 MG tablet TAKE 1 TABLET BY MOUTH EVERYDAY AT BEDTIME 90 tablet 1   rizatriptan (MAXALT) 10 MG tablet TAKE 1 TABLET BY MOUTH AS NEEDED FOR MIGRAINE. MAY REPEAT IN 2 HOURS IF NEEDED 10 tablet 0   sulfamethoxazole-trimethoprim (BACTRIM DS) 800-160 MG tablet Take 1 tablet by mouth 2 (two) times daily. 6 tablet 0   traZODone (DESYREL) 100 MG tablet Take 1-1.5 tablets (100-150 mg total) by mouth at bedtime. TAKE 1 TABLET BY MOUTH EVERYDAY AT BEDTIME 135 tablet 1   vortioxetine HBr (TRINTELLIX) 20 MG TABS tablet Take 0.5 tablets (10 mg total) by mouth daily. 30 tablet 1   warfarin (COUMADIN) 5 MG tablet TAKE 1 TABLET DAILY ON SUN,MON,WED,FRI,SAT. TAKES 7.5 MG ON TU and THU 124 tablet 1   No current facility-administered medications for this visit.    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  Off and on    Alcohol Use:  None   Drug Use:  None   Diet:  Regular   Exercise:  None   Hobbies:   Reading, Traveling             Social Determinants of Health   Financial Resource Strain: Not on file  Food Insecurity: Not on file  Transportation Needs: Not on file  Physical Activity: Not on file  Stress: Not on file  Social Connections: Not on file  Intimate Partner Violence: Not on file    Family History: Family History  Problem Relation Age of Onset   Cancer Mother 39       Breast    Marfan syndrome Father    Heart disease Father    Marfan syndrome Sister    Marfan syndrome Paternal Grandmother    Marfan syndrome Sister     Review of Systems: All other systems reviewed and are otherwise negative except as noted above.   Physical Exam: There were no vitals filed for this visit.   GEN- The patient is well appearing, alert and oriented x 3 today.   HEENT: normocephalic, atraumatic; sclera clear, conjunctiva pink; hearing intact; oropharynx clear; neck supple, no JVP Lymph- no cervical lymphadenopathy Lungs- Clear to ausculation bilaterally, normal work of breathing.  No wheezes, rales, rhonchi Heart- Regular rate and rhythm, no murmurs, rubs or gallops, PMI not laterally displaced GI- soft, non-tender, non-distended, bowel sounds present, no hepatosplenomegaly Extremities- no clubbing or cyanosis. No edema; DP/PT/radial pulses 2+ bilaterally MS- no significant deformity or atrophy Skin- warm and dry, no rash or lesion; ICD pocket well healed Psych- euthymic mood, full affect Neuro- strength and sensation are intact  ICD interrogation- reviewed in detail today,  See PACEART report  EKG:  EKG is ordered today. Personal review of EKG ordered today shows ***  Recent Labs: 03/31/2021: ALT 11; BUN 11; Creatinine, Ser 0.81; Hemoglobin 14.0; Platelets 253.0; Potassium 4.0; Sodium 141; TSH 1.51   Wt Readings from Last 3 Encounters:  08/31/21 164 lb (74.4 kg)  08/06/21 166 lb 6.4 oz (75.5 kg)  03/31/21 159 lb 3.2 oz (72.2 kg)     Other studies  Reviewed: Additional studies/ records that were reviewed today include: Previous EP office notes.   Assessment and Plan:  1.  Chronic systolic dysfunction s/p Medtronic single chamber ICD  euvolemic today Stable on an appropriate medical regimen Normal ICD function See Pace Art report No changes today  2. Marfan syndrome s/p aortic root replacement and aortic valve replacement   Current medicines are reviewed at length with the patient today.   =  Labs/ tests ordered today include: *** No orders of the defined types were placed in this encounter.    Disposition:  Follow up with {Blank single:19197::"Dr. Allred","Dr. Arlan Organ. Klein","Dr. Camnitz","Dr. Lambert","EP APP"} in {Blank single:19197::"2 weeks","4 weeks","3 months","6 months","12 months","as usual post gen change"}    Signed, Annamaria Helling  10/28/2021 9:03 AM  [facility] [address] [place]  [place] [address] [phone] (office) [phone] (fax)

## 2021-10-30 ENCOUNTER — Encounter: Payer: Self-pay | Admitting: Registered Nurse

## 2021-10-31 ENCOUNTER — Other Ambulatory Visit: Payer: Self-pay | Admitting: Registered Nurse

## 2021-10-31 DIAGNOSIS — F3160 Bipolar disorder, current episode mixed, unspecified: Secondary | ICD-10-CM

## 2021-11-03 ENCOUNTER — Encounter: Payer: Self-pay | Admitting: Registered Nurse

## 2021-11-04 NOTE — Telephone Encounter (Signed)
Please advise message below for patient's short term disability and concerns for moving forward.

## 2021-11-11 ENCOUNTER — Telehealth (HOSPITAL_COMMUNITY): Payer: Self-pay | Admitting: Psychiatry

## 2021-11-11 ENCOUNTER — Encounter (HOSPITAL_COMMUNITY): Payer: Self-pay | Admitting: Psychiatry

## 2021-11-11 ENCOUNTER — Ambulatory Visit (HOSPITAL_BASED_OUTPATIENT_CLINIC_OR_DEPARTMENT_OTHER): Payer: 59 | Admitting: Psychiatry

## 2021-11-11 VITALS — Wt 152.0 lb

## 2021-11-11 DIAGNOSIS — F410 Panic disorder [episodic paroxysmal anxiety] without agoraphobia: Secondary | ICD-10-CM | POA: Diagnosis not present

## 2021-11-11 DIAGNOSIS — F3160 Bipolar disorder, current episode mixed, unspecified: Secondary | ICD-10-CM

## 2021-11-11 MED ORDER — DIVALPROEX SODIUM ER 250 MG PO TB24: 250.0000 mg | ORAL_TABLET | Freq: Two times a day (BID) | ORAL | 0 refills | Status: DC

## 2021-11-11 NOTE — Telephone Encounter (Signed)
Please advise 

## 2021-11-11 NOTE — Progress Notes (Signed)
Virtual Visit via Video Note  I connected with Melissa Hayden on 11/11/21 at 11:00 AM EST by a video enabled telemedicine application and verified that I am speaking with the correct person using two identifiers.  Location: Patient: Home Provider: Home Office   I discussed the limitations of evaluation and management by telemedicine and the availability of in person appointments. The patient expressed understanding and agreed to proceed.    [facility] Initial Assessment Note  Melissa Hayden [id number] 46 y.o.  11/11/2021 11:10 AM  Chief Complaint:  Patient is a 46 year old Caucasian, employed, divorced female who is referred from her PCP for the management of her psychiatric symptoms.  Patient known to this Probation officer from the past.  She was last seen in March 2018.  Patient admitted noncompliant with the follow-up but getting medication from her PCP.  She did well on olanzapine 20 mg for a while until 2 months ago she started to have a lot of depressive symptoms.  She reported crying spells, racing thoughts, lack of motivation to do things.  She reported she has no desire to do her daily activities.  Her house is mess and she has no energy to clean the house.  She is not working since December 15 as her PCP took her out from work.  Patient is afraid that she would lose the job.  She also feels very anxious, nervous and started to have panic attack.  She is afraid that she may lose her dogs.  She has 3 dogs.  Lately she also had a lot of anger and resentment about her family and she is not sure why.  Her parents and family is deceased and she never had that feeling about them until recently held lonely.  She denies any active suicidal thoughts but endorses ruminative and negative thoughts.  Sometimes she feels that she was that denies any intent or plan to kill herself.  She also reported decrease in her personal hygiene as not taking a bath, cleaning, eating well.  She had lost more than 20  pounds in past few months.  She has very limited social network.  She is close to her 38 year old niece who lives in [place].  She has only 2 close friend [place] and Caryl Asp.  Her PCP trying to adjust her psychiatric medication.  She is on trazodone, olanzapine and Celexa but recently Celexa was changed to Prozac but she had a horrible side effects from the Prozac after 3 weeks and now she is taking Trintellix 20 mg every day.  Her PCP also started her on Klonopin to help the panic attacks which she described comes frequently for no reason.  Despite taking all the medication she is still struggling with insomnia.  Her PCP tried short-term disability and FMLA but her short-term disability was not approved.  Currently she is on FMLA and is supposed to go back to work on February 17.  She endorses unable to function at work and afraid she may lose job.  She is working at [employer] for more than a year and a half.  Patient denies drinking or using any illegal substances.  She denies any hallucination but endorsed episodes of mania which she described excessive spending but lately she is feeling more depressed.  She feels sometimes hopeless but decreased concentration, fatigue, anhedonia.  She denies any nightmares or flashbacks.  She has no tremor or shakes or any EPS.  Currently she is not seeing any therapist.  Past  Psychiatric History: History of mania and depression.  Diagnosed bipolar disorder in the past with history of inpatient had job on status 15 years ago due to suicidal attempt on Tylenol PM.  She has another inpatient at [facility] in 2018 due to suicidal attempt.  History of alcohol and using pain medication but claims to be sober for more than 10 years.  He had tried Abilify, Lexapro, Seroquel, Cymbalta, Prozac, hydroxyzine and Celexa.  Depakote work well which was given for migraine headaches.  Family History: Multiple family member had psychiatric illness.  Sister had history  of drug use, 1 sister killed in a [place] by using drugs and another sister overdosed on drugs and killed in December 2017.  Past Medical History:  Diagnosis Date   Anticoagulant long-term use    after valve surgery for Marfan's syndrome   Anxiety    Ascending aorta dilatation (HCC)    Bipolar disorder (HCC)    Depression    Marfan's syndrome    Dr. Atilano Median   Migraine    Mitral valve prolapse    Palpitations    Suicidal behavior    Tylenol Overdose   Varicose veins      Work History; Patient working at [employer] for more than a year and a half.  Psychosocial History; Patient born in [place] and moved to [place] and then moved to [place] at age 37 closer to her mother's family.  She was very close to her mother who died 83 years ago.  Her parents divorced at age 73.  Patient married twice.  She has no children.  Her second husband had history of drug use.  Patient lives with her 3 dogs.  Legal History; Patient denies any legal issues.  Substance use history; History of heavy drinking and abusing pain medication but claims to be sober for a while.  Neurologic: Headache: Yes Seizure: No Paresthesias: No   Outpatient Encounter Medications as of 11/11/2021  Medication Sig   acetaminophen (TYLENOL) 650 MG CR tablet Take 1,300 mg by mouth every 8 (eight) hours as needed (for headache/pain relief).   carvedilol (COREG) 6.25 MG tablet TAKE 1 TABLET BY MOUTH TWICE A DAY   citalopram (CELEXA) 10 MG tablet TAKE 1 TABLET BY MOUTH EVERY DAY   clonazePAM (KLONOPIN) 0.5 MG disintegrating tablet Take 1 tablet (0.5 mg total) by mouth 3 (three) times daily as needed for seizure.   EPINEPHrine 0.3 mg/0.3 mL IJ SOAJ injection USE AS DIRECTED WHEN NEEDED   hydrOXYzine (ATARAX) 10 MG tablet Take 1 tablet (10 mg total) by mouth 3 (three) times daily as needed.   Multiple Vitamin (MULTIVITAMIN WITH MINERALS) TABS tablet Take 1 tablet by mouth daily. Centrum Ultra Women's Oral    OLANZapine (ZYPREXA) 20 MG tablet TAKE 1 TABLET BY MOUTH EVERYDAY AT BEDTIME   rizatriptan (MAXALT) 10 MG tablet TAKE 1 TABLET BY MOUTH AS NEEDED FOR MIGRAINE. MAY REPEAT IN 2 HOURS IF NEEDED   sulfamethoxazole-trimethoprim (BACTRIM DS) 800-160 MG tablet Take 1 tablet by mouth 2 (two) times daily.   traZODone (DESYREL) 100 MG tablet Take 1-1.5 tablets (100-150 mg total) by mouth at bedtime. TAKE 1 TABLET BY MOUTH EVERYDAY AT BEDTIME   vortioxetine HBr (TRINTELLIX) 20 MG TABS tablet Take 0.5 tablets (10 mg total) by mouth daily.   warfarin (COUMADIN) 5 MG tablet TAKE 1 TABLET DAILY ON SUN,MON,WED,FRI,SAT. TAKES 7.5 MG ON TU and THU   No facility-administered encounter medications on file as of 11/11/2021.  No results found for this or any previous visit (from the past 2160 hour(s)).    Constitutional:  There were no vitals taken for this visit.   Musculoskeletal: Strength & Muscle Tone: within normal limits Gait & Station: normal Patient leans: N/A  Psychiatric Specialty Exam: Physical Exam  ROS  Weight 152 lb (68.9 kg).There is no height or weight on file to calculate BMI.  General Appearance: Fairly Groomed  Eye Contact:  Fair  Speech:  Slow  Volume:  Decreased  Mood:  Anxious and Depressed  Affect:  Constricted  Thought Process:  Descriptions of Associations: Intact  Orientation:  Full (Time, Place, and Person)  Thought Content:  Rumination  Suicidal Thoughts:  No  Homicidal Thoughts:  No  Memory:  Immediate;   Good Recent;   Good Remote;   Fair  Judgement:  Fair  Insight:  Shallow  Psychomotor Activity:  Decreased  Concentration:  Concentration: Fair and Attention Span: Fair  Recall:  Good  Fund of Knowledge:  Good  Language:  Good  Akathisia:  No  Handed:  Right  AIMS (if indicated):     Assets:  Communication Skills Desire for Improvement Housing Transportation  ADL's:  Intact  Cognition:  WNL  Sleep:   poor     Assessment/Plan:  Patient is  46 year old Caucasian, employed, divorced female who is known to this Probation officer from the past now referred from PCP for medication management.  Patient is struggling with anxiety, depression and currently on FMLA.  Her last day of work was February 15.  Her PCP filed for short-term disability but it was denied.  Patient afraid she may lose her job as not able to function.  I review her current medication.  Her current medicine are olanzapine 20 mg at bedtime, trazodone 100 mg at bedtime, Klonopin 0.5 mg to take as needed for anxiety and recently Trintellix 20 mg started by PCP 3 weeks ago.  I had a long discussion with the patient about her diagnosis, symptoms, history of noncompliance with follow-ups.  We talk about excessive weight loss in 3 months.  I recommend she should contact her PCP to address weight loss.  I do believe patient not ready to go back to work at this time.  I offered IOP to help her coping skills as patient currently has no therapist and struggling with coping skills, daily basis.  She had a lot of resentment about her family members who are deceased.  I also talk about starting Depakote 250 mg twice a day which had helped her in the past but for unknown reason it was discontinued.  I reviewed her blood work results and her last hemoglobin A1c which was done in June was normal.  Her CMP was also normal.  Patient agreed to give a try to Depakote I recommend if Depakote make her sleepy or groggy then she should take at nighttime and try to avoid trazodone.  We will refer her to IOP.  Patient will contact our office to fill out short-term disability if needed.  Follow up in 2 to 3 weeks.  Kathlee Nations, MD 11/11/2021           Follow Up Instructions:    I discussed the assessment and treatment plan with the patient. The patient was provided an opportunity to ask questions and all were answered. The patient agreed with the plan and demonstrated an understanding of the instructions.    The patient was advised to call back or seek  an in-person evaluation if the symptoms worsen or if the condition fails to improve as anticipated.  I provided 62 minutes of non-face-to-face time during this encounter.   Kathlee Nations, MD

## 2021-11-11 NOTE — Telephone Encounter (Signed)
The telephone number for Melissa Hayden is [phone].

## 2021-11-11 NOTE — Telephone Encounter (Signed)
Do we have a phone # for Candice? I would be happy to supply anything that is needed.  Thanks,  Denice Paradise

## 2021-11-12 ENCOUNTER — Telehealth (HOSPITAL_COMMUNITY): Payer: Self-pay | Admitting: Psychiatry

## 2021-11-12 NOTE — Telephone Encounter (Signed)
Patient states that's she has also started intensive outpatient counseling and is being put on Depakote. FYI

## 2021-11-12 NOTE — Telephone Encounter (Signed)
D:  Patient returned the case manager's call re: MH-IOP.  A:  Oriented pt.  Pt CCA is schedule for 11-16-21 @ 9:30 a.m; pt to start [place] on 11-18-21.  Inform Dr. Adele Schilder.  R:  Pt receptive.

## 2021-11-13 ENCOUNTER — Other Ambulatory Visit (HOSPITAL_COMMUNITY): Payer: 59 | Attending: Psychiatry | Admitting: Psychiatry

## 2021-11-13 ENCOUNTER — Other Ambulatory Visit: Payer: Self-pay | Admitting: Registered Nurse

## 2021-11-13 ENCOUNTER — Other Ambulatory Visit: Payer: Self-pay

## 2021-11-13 DIAGNOSIS — F3160 Bipolar disorder, current episode mixed, unspecified: Secondary | ICD-10-CM | POA: Insufficient documentation

## 2021-11-13 DIAGNOSIS — Z9151 Personal history of suicidal behavior: Secondary | ICD-10-CM | POA: Insufficient documentation

## 2021-11-13 MED ORDER — CLONAZEPAM 0.5 MG PO TBDP: 0.5000 mg | ORAL_TABLET | Freq: Three times a day (TID) | ORAL | 0 refills | Status: DC | PRN

## 2021-11-13 NOTE — Telephone Encounter (Signed)
Patient is requesting a refill of the following medications: Requested Prescriptions   Pending Prescriptions Disp Refills   clonazePAM (KLONOPIN) 0.5 MG disintegrating tablet 30 tablet 0    Sig: Take 1 tablet (0.5 mg total) by mouth 3 (three) times daily as needed for seizure.    Date of patient request: 11/13/21 Last office visit: 09/25/21 Date of last refill: 10/26/21 Last refill amount: 30

## 2021-11-14 ENCOUNTER — Other Ambulatory Visit: Payer: Self-pay | Admitting: Registered Nurse

## 2021-11-14 DIAGNOSIS — G43709 Chronic migraine without aura, not intractable, without status migrainosus: Secondary | ICD-10-CM

## 2021-11-16 ENCOUNTER — Other Ambulatory Visit (HOSPITAL_COMMUNITY): Payer: 59

## 2021-11-16 ENCOUNTER — Telehealth (HOSPITAL_COMMUNITY): Payer: Self-pay | Admitting: Psychiatry
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
## 2021-11-16 NOTE — Telephone Encounter (Signed)
Late entry:  D:  Pt attempted to reach pt twice on 11-13-21, in order to complete a CCA/Assessment but there was no answer.  Left vm twice.  The last time the case mgr left vm stating she would be contacting pt to complete the CCA during regular scheduled appt (11-13-21 @ 9:30 a.m.).  On 11-12-21 @10 :15 pm; pt emailed short term disability forms to the case mgr.  A:  Inform Dr. Adele Schilder.

## 2021-11-17 ENCOUNTER — Ambulatory Visit (HOSPITAL_COMMUNITY): Payer: 59 | Admitting: Psychiatry

## 2021-11-17 ENCOUNTER — Telehealth (HOSPITAL_COMMUNITY): Payer: Self-pay | Admitting: Psychiatry

## 2021-11-18 ENCOUNTER — Other Ambulatory Visit: Payer: Self-pay

## 2021-11-18 ENCOUNTER — Other Ambulatory Visit (HOSPITAL_COMMUNITY): Payer: 59 | Admitting: Psychiatry

## 2021-11-18 ENCOUNTER — Encounter (HOSPITAL_COMMUNITY): Payer: Self-pay | Admitting: Psychiatry

## 2021-11-18 DIAGNOSIS — Z9151 Personal history of suicidal behavior: Secondary | ICD-10-CM | POA: Diagnosis not present

## 2021-11-18 DIAGNOSIS — F3160 Bipolar disorder, current episode mixed, unspecified: Secondary | ICD-10-CM | POA: Diagnosis not present

## 2021-11-18 NOTE — Progress Notes (Signed)
Virtual Visit via Video Note  I connected with Melissa Hayden on 11/19/21 at  9:00 AM EST by a video enabled telemedicine application and verified that I am speaking with the correct person using two identifiers.  Location: Patient: Home Provider: Office    I discussed the limitations of evaluation and management by telemedicine and the availability of in person appointments. The patient expressed understanding and agreed to proceed.   I discussed the assessment and treatment plan with the patient. The patient was provided an opportunity to ask questions and all were answered. The patient agreed with the plan and demonstrated an understanding of the instructions.   The patient was advised to call back or seek an in-person evaluation if the symptoms worsen or if the condition fails to improve as anticipated.  I provided 15 minutes of non-face-to-face time during this encounter.   Derrill Center, NP     Psychiatric Initial Adult Assessment   Patient Identification: Melissa Hayden MRN:  [id number] Date of Evaluation:  11/18/2021 Referral Source: MD Afreen Chief Complaint:  Depression  Visit Diagnosis:    ICD-10-CM   1. Bipolar disorder, mixed ([place])  F31.60       History of Present Illness:  Melissa Hayden is a 46 year old female that presents with worsening depression and anxiety.  She reports a history of major depressive disorder, bipolar disorder and generalized anxiety disorder.  Reports history of inpatient admissions.  With 2 previous suicide attempts.  Reports her last suicide attempt was 4 years ago.  Reports decline in her ADLs states she has been out of able to focus or concentrate at work.  States she is currently employed by [employer] however due to increased work demands caused her depression and anxiety to intensify.  Melissa Hayden denied illicit drug use or substance abuse history.  Denied history of physical sexual abuse.  Reports a family history with mental illness.  Family  history with mental illness stated that she has 2 sister and her youngest sister: Completed suicide. reports her niece is supportive she resides in [place] and states that her niece struggles with depression and anxiety as well.  Melissa Hayden reported worsening depressive, passive suicidal ideation.  Denied plan or intent.  Reports racing thoughts and symptoms of worry.  Reports fair appetite.  States she is resting " okay" throughout the night.  Currently she is prescribed Celexa, Klonopin, Depakote, Zyprexa and trazodone.  Which she reports taking and tolerating well.  Patient to start intensive outpatient programming 11/18/2021   Associated Signs/Symptoms: Depression Symptoms:  depressed mood, feelings of worthlessness/guilt, difficulty concentrating, anxiety, (Hypo) Manic Symptoms:  Distractibility, Irritable Mood, Labiality of Mood, Anxiety Symptoms:  Excessive Worry, Psychotic Symptoms:  Hallucinations: None PTSD Symptoms: NA  Past Psychiatric History:   Previous Psychotropic Medications: No   Substance Abuse History in the last 12 months:  No.  Consequences of Substance Abuse: NA  Past Medical History:  Past Medical History:  Diagnosis Date   Anticoagulant long-term use    after valve surgery for Marfan's syndrome   Anxiety    Ascending aorta dilatation (HCC)    Bipolar disorder (HCC)    Depression    Marfan's syndrome    Dr. Atilano Median   Migraine    Mitral valve prolapse    Palpitations    Suicidal behavior    Tylenol Overdose   Varicose veins     Past Surgical History:  Procedure Laterality Date   ANKLE SURGERY Left    BREAST CYST EXCISION  CARDIAC VALVE SURGERY  09/2014   AV, MV replacement, aortic root replacement ([facility] Ysidro Evert [facility])   ICD IMPLANT N/A 01/05/2018   Procedure: ICD IMPLANT;  Surgeon: Evans Lance, MD;  Location: [facility];  Service: Cardiovascular;  Laterality: N/A;   INNER EAR SURGERY Right     Family Psychiatric History:   Family  History:  Family History  Problem Relation Age of Onset   Cancer Mother 52       Breast    Marfan syndrome Father    Heart disease Father    Marfan syndrome Sister    Marfan syndrome Paternal Grandmother    Marfan syndrome Sister     Social History:   Social History   Socioeconomic History   Marital status: Divorced    Spouse name: Not on file   Number of children: 0   Years of education: 16   Highest education level: Not on file  Occupational History   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  Off and on    Alcohol Use:  None   Drug Use:  None   Diet:  Regular   Exercise:  None   Hobbies:  Reading, Traveling             Social Determinants of Health   Financial Resource Strain: Not on file  Food Insecurity: Not on file  Transportation Needs: Not on file  Physical Activity: Not on file  Stress: Not on file  Social Connections: Not on file    Additional Social History:   Allergies:   Allergies  Allergen Reactions   Abilify [Aripiprazole] Other (See Comments)    "Flattened affect" "couldn't do anything while taking it"    Metabolic Disorder Labs: Lab Results  Component Value Date   HGBA1C 5.3 03/31/2021   No results found for: PROLACTIN Lab Results  Component Value Date   CHOL 196 03/31/2021   TRIG 117.0 03/31/2021   HDL 48.90 03/31/2021   CHOLHDL 4 03/31/2021   VLDL 23.4 03/31/2021   LDLCALC 124 (H) 03/31/2021   LDLCALC 102 (H) 01/23/2020    Lab Results  Component Value Date   TSH 1.51 03/31/2021    Therapeutic Level Labs: No results found for: LITHIUM No results found for: CBMZ Lab Results  Component Value Date   VALPROATE <12.5 (L) 09/10/2016    Current Medications: Current Outpatient Medications  Medication Sig Dispense Refill   acetaminophen (TYLENOL) 650 MG CR tablet Take 1,300 mg by mouth every 8 (eight) hours as needed (for headache/pain relief).     carvedilol (COREG) 6.25 MG tablet TAKE 1 TABLET BY MOUTH TWICE A DAY 180 tablet 3   citalopram (CELEXA) 10 MG tablet TAKE 1 TABLET BY MOUTH EVERY DAY (Patient not taking: Reported on 11/11/2021) 90 tablet 0   clonazePAM (KLONOPIN) 0.5 MG  disintegrating tablet Take 1 tablet (0.5 mg total) by mouth 3 (three) times daily as needed for seizure. 30 tablet 0   divalproex (DEPAKOTE ER) 250 MG 24 hr tablet Take 1 tablet (250 mg total) by mouth 2 (two) times daily. 60 tablet 0   EPINEPHrine 0.3 mg/0.3 mL IJ SOAJ injection USE AS DIRECTED WHEN NEEDED 1 each 99   hydrOXYzine (ATARAX) 10 MG tablet Take 1 tablet (10 mg total) by mouth 3 (three) times daily as needed. (Patient not taking: Reported on 11/11/2021) 30 tablet 0   Multiple Vitamin (MULTIVITAMIN WITH MINERALS) TABS tablet Take 1 tablet by mouth daily. Centrum Ultra Women's Oral     OLANZapine (ZYPREXA) 20 MG tablet TAKE 1 TABLET BY MOUTH EVERYDAY AT BEDTIME 90 tablet 1   rizatriptan (MAXALT) 10 MG tablet TAKE 1 TABLET BY MOUTH AS NEEDED FOR MIGRAINE. MAY REPEAT IN 2 HOURS IF NEEDED 10 tablet 0   sulfamethoxazole-trimethoprim (BACTRIM DS) 800-160 MG tablet Take 1 tablet by mouth 2 (two) times daily. (Patient not taking: Reported on 11/11/2021) 6 tablet 0   traZODone (DESYREL) 100 MG tablet Take 1-1.5 tablets (100-150 mg total) by mouth at bedtime. TAKE 1 TABLET BY MOUTH EVERYDAY AT BEDTIME 135 tablet 1   vortioxetine HBr (TRINTELLIX) 20 MG TABS tablet Take 0.5 tablets (10 mg total) by mouth daily. 30 tablet 1   warfarin  (COUMADIN) 5 MG tablet TAKE 1 TABLET DAILY ON SUN,MON,WED,FRI,SAT. TAKES 7.5 MG ON TU and THU 124 tablet 1   No current facility-administered medications for this visit.    Musculoskeletal: Strength & Muscle Tone: within normal limits Gait & Station: normal Patient leans: N/A  Psychiatric Specialty Exam: Review of Systems  There were no vitals taken for this visit.There is no height or weight on file to calculate BMI.  General Appearance: Casual  Eye Contact:  Good  Speech:  Clear and Coherent  Volume:  Normal  Mood:  Anxious and Depressed  Affect:  Congruent  Thought Process:  Coherent  Orientation:  Full (Time, Place, and Person)  Thought Content:  Logical  Suicidal Thoughts:  No  Homicidal Thoughts:  No  Memory:  Immediate;   Fair Recent;   Fair  Judgement:  Good  Insight:  Good  Psychomotor Activity:  Normal  Concentration:  Concentration: Good  Recall:  Good  Fund of Knowledge:Good  Language: Good  Akathisia:  No  Handed:  Right  AIMS (if indicated):  done  Assets:  Communication Skills Desire for Improvement  ADL's:  Intact  Cognition: WNL  Sleep:  Good   Screenings: GAD-7    Flowsheet Row Office Visit from 08/06/2021 in [facility]  Total GAD-7 Score 14      PHQ2-9    [place] Counselor from 11/13/2021 in [place] Video Visit from 08/31/2021 in [facility] Office Visit from 08/06/2021 in [place] Office Visit from 09/24/2019 in [place] Office Visit from 05/30/2018 in [place]  PHQ-2 Total Score 4 4 6 4  0  PHQ-9 Total Score 17 13 17 15  0      Flowsheet Row Counselor from 11/13/2021 in [place] Office Visit from 11/11/2021 in [facility]  C-SSRS RISK CATEGORY  Error: Question 6 not populated High Risk       Assessment and Plan:  Start Intensive Outpatient programming - Continue medications as directed  Treatment plan  was reviewed and agreed upon by NP T. Bobby Rumpf and patient Melissa Hayden need for group services   Derrill Center, NP 2/8/202312:06 PM

## 2021-11-18 NOTE — Progress Notes (Signed)
Virtual Visit via Video Note   I connected with Melissa Hayden on 11/18/21 at  9:00 AM EDT by a video enabled telemedicine application and verified that I am speaking with the correct person using two identifiers.   At orientation to the IOP program, Case Manager discussed the limitations of evaluation and management by telemedicine and the availability of in person appointments. The patient expressed understanding and agreed to proceed with virtual visits throughout the duration of the program.   Location:  Patient: Patient Home Provider: [facility] [place] [facility]   History of Present Illness: Bipolar Disorder    Observations/Objective: Check In: Case Manager checked in with all participants to review discharge dates, insurance authorizations, work-related documents and needs from the treatment team regarding medications. Melissa Hayden stated needs and engaged in discussion.    Initial Therapeutic Activity: Counselor facilitated a check-in with Melissa Hayden to assess for safety, sobriety and medication compliance.  Counselor also inquired about Melissa Hayden's current emotional ratings, as well as any significant changes in thoughts, feelings or behavior since previous check in.  Melissa Hayden presented for session on time and was alert, oriented x5, with no evidence or self-report of active SI/HI or A/V H.  Melissa Hayden reported compliance with medication and denied use of alcohol or illicit substances.  Melissa Hayden reported scores of 7/10 for depression, 7/10 for anxiety, and 5/10 for anger/irritability.  Melissa Hayden denied any recent outbursts or panic attacks.  Melissa Hayden reported that a recent success was getting outside of the home with her dogs yesterday for fresh air and exercise, stating They keep me grounded.  Melissa Hayden reported that a recent struggle was missing her first group yesterday due to anxiety.  Melissa Hayden reported that her goal is to learn new coping skills to cope with anxiety, stating Its hard for me to get out of the home and be around people.          Second Therapeutic Activity: Counselor introduced Melissa Hayden, [employer] Pharmacist, to provide psychoeducation on topic of medication compliance with members today.  Melissa Hayden provided psychoeducation on classes of medications such as antidepressants, antipsychotics, what symptoms they are intended to treat, and any side effects one might encounter while on a particular prescription.  Time was allowed for clients to ask any questions they might have of Melissa Hayden regarding this specialty.  Intervention was effective, as evidenced by Melissa Hayden participating in discussion with speaker on the subject, reporting that she was recently prescribed klonopin one month ago to help cope with anxiety, but she has been using it at night to cope with racing thoughts and wasn't sure if this was appropriate since she wasn't given much direction on when she should take it.  [name] was receptive to feedback from pharmacist on appropriate situations to use the medication and how it is intended to address anxiety.    Third Therapeutic Activity: Counselor provided demonstration of relaxation technique known as mindful breathing to help members increase sense of calm, resiliency, and control.  Counselor guided members through process of getting comfortable, achieving a relaxed breathing rhythm, and focusing on this for several minutes, allowing troubling thoughts and feelings to come and go without rumination.  Counselor processed effectiveness of activity afterward in discussion with members, including how this impacted their mental state, whether it was difficult to stay focused, and if they plan to include it in self-care routine to improve day-to-day coping.  Intervention effectiveness could not be measured, as Melissa Hayden had to speak with NP about medications during practice of this technique.  Counselor  emailed Melissa Hayden a copy of exercise, encouraging her to practice this on own to aid in improvement of coping skills.    Assessment and Plan: Counselor  recommends that [facility] remain in IOP treatment to better manage mental health symptoms, ensure stability and pursue completion of treatment plan goals. Counselor recommends adherence to crisis/safety plan, taking medications as prescribed, and following up with medical professionals if any issues arise.   Follow Up Instructions: Counselor will send Webex link for next session. Melissa Hayden was advised to call back or seek an in-person evaluation if the symptoms worsen or if the condition fails to improve as anticipated.   I provided 180 minutes of non-face-to-face time during this encounter.   [name], LCSW, LCAS 11/18/21

## 2021-11-19 ENCOUNTER — Other Ambulatory Visit: Payer: Self-pay

## 2021-11-19 ENCOUNTER — Other Ambulatory Visit (HOSPITAL_COMMUNITY): Payer: 59 | Admitting: Licensed Clinical Social Worker

## 2021-11-19 DIAGNOSIS — F3160 Bipolar disorder, current episode mixed, unspecified: Secondary | ICD-10-CM | POA: Diagnosis not present

## 2021-11-19 NOTE — Progress Notes (Signed)
Virtual Visit via Video Note   I connected with Eligha Bridegroom on 11/19/21 at  9:00 AM EDT by a video enabled telemedicine application and verified that I am speaking with the correct person using two identifiers.   At orientation to the IOP program, Case Manager discussed the limitations of evaluation and management by telemedicine and the availability of in person appointments. The patient expressed understanding and agreed to proceed with virtual visits throughout the duration of the program.   Location:  Patient: Patient Home Provider: [facility] [place] [facility]   History of Present Illness: Bipolar Disorder    Observations/Objective: Check In: Case Manager checked in with all participants to review discharge dates, insurance authorizations, work-related documents and needs from the treatment team regarding medications. Theodora stated needs and engaged in discussion.    Initial Therapeutic Activity: Counselor facilitated a check-in with Danaya to assess for safety, sobriety and medication compliance.  Counselor also inquired about Lanasia's current emotional ratings, as well as any significant changes in thoughts, feelings or behavior since previous check in.  Mariska presented for session on time and was alert, oriented x5, with no evidence or self-report of active SI/HI or A/V H.  Jaylean reported compliance with medication and denied use of alcohol or illicit substances.  Kellyjo reported scores of 7/10 for depression, 5/10 for anxiety, and 8/10 for anger/irritability.  Jamese denied any recent outbursts or panic attacks.  Edie reported that a recent success was getting out of the house yesterday to pick up some dog food.  Peg reported that a current struggle is dealing with anxiety related to starting MHIOP, stating I know it will go away as I get more comfortable.  Kahliyah reported that she also deal with anger quite a bit, and was offered the chance to share about the source of this during group for feedback.  She was  appreciative to feedback from counselor and group members of ways to manage anger in healthier ways, stating If I'm angry about something I usually push it to the back of my mind.  Lyndon reported that her goal today is to go shopping at the grocery store.        Second Therapeutic Activity: Counselor covered topic of core beliefs with group today.  Counselor virtually shared a handout on the subject, which explained how everyone looks at the world differently, and two people can have the same experience, but have different interpretations of what happened.  Members were encouraged to think of these like sunglasses with different shades influencing perception towards positive or negative outcomes.  Examples of negative core beliefs were provided, such as I'm unlovable, I'm not good enough, and I'm a bad person.  Members were asked to share which one(s) they could relate to, and then identify evidence which contradicts these beliefs.  Intervention was effective, as evidenced by [facility] successfully participating in discussion on the subject and reporting that she could relate to several negative core beliefs listed on the handout, such as I am trapped, I will never get through this, I am worthless, and I'm not good enough.  Karinda was able to successfully challenge these beliefs, reporting that although she doesn't feel like she 'has it all together', she has been able to successfully enter group therapy and venture out of the home more despite her anxiety, as well as continue taking care of her animals, that give her unconditional love.     Assessment and Plan: Counselor recommends that [facility] remain in IOP treatment to  better manage mental health symptoms, ensure stability and pursue completion of treatment plan goals. Counselor recommends adherence to crisis/safety plan, taking medications as prescribed, and following up with medical professionals if any issues arise.   Follow Up  Instructions: Counselor will send Webex link for next session. Dione was advised to call back or seek an in-person evaluation if the symptoms worsen or if the condition fails to improve as anticipated.   I provided 180 minutes of non-face-to-face time during this encounter.   [name], LCSW, LCAS 11/19/21

## 2021-11-20 ENCOUNTER — Other Ambulatory Visit (HOSPITAL_COMMUNITY): Payer: 59 | Admitting: Licensed Clinical Social Worker

## 2021-11-20 DIAGNOSIS — F3160 Bipolar disorder, current episode mixed, unspecified: Secondary | ICD-10-CM | POA: Diagnosis not present

## 2021-11-20 NOTE — Progress Notes (Signed)
Virtual Visit via Video Note   I connected with Melissa Hayden on 11/20/21 at  9:00 AM EDT by a video enabled telemedicine application and verified that I am speaking with the correct person using two identifiers.   At orientation to the IOP program, Case Manager discussed the limitations of evaluation and management by telemedicine and the availability of in person appointments. The patient expressed understanding and agreed to proceed with virtual visits throughout the duration of the program.   Location:  Patient: Patient Home Provider: [facility] [place] [facility]   History of Present Illness: Bipolar Disorder    Observations/Objective: Check In: Case Manager checked in with all participants to review discharge dates, insurance authorizations, work-related documents and needs from the treatment team regarding medications. Melissa Hayden stated needs and engaged in discussion.    Initial Therapeutic Activity: Counselor facilitated a check-in with Melissa Hayden to assess for safety, sobriety and medication compliance.  Counselor also inquired about Melissa Hayden's current emotional ratings, as well as any significant changes in thoughts, feelings or behavior since previous check in.  Melissa Hayden presented for session on time and was alert, oriented x5, with no evidence or self-report of active SI/HI or A/V H.  Melissa Hayden reported compliance with medication and denied use of alcohol or illicit substances.  Melissa Hayden reported scores of 3/10 for depression, 5/10 for anxiety, and 0/10 for anger/irritability.  Melissa Hayden denied any recent outbursts or panic attacks.  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  Melissa Hayden denied any new challenges, stating It was a pretty good day.  Talking in group helped a lot.  Melissa Hayden reported that her goal is to continue expressing feelings in a healthy way.         Second Therapeutic Activity: Counselor offered to teach  group members an ACT relaxation technique today to aid in managing difficult thoughts, feelings, urges, and sensations.  Counselor guided members through process of getting comfortable, achieving relaxing breathing rhythm, and then maintaining this throughout activity.  Counselor invited members to imagine a gently flowing stream in their mind with leaves floating upon it, and when any thoughts, feelings, urges, or sensations arose, good or bad, they were instructed to visualize placing them on these passing leaves over course of 10 minutes practice.  Intervention was effective, as evidenced by [facility] successfully participating in activity and reporting that this was helpful for alleviating anxiety, stating It was very relaxing.  Third Therapeutic Activity: Psycho-educational portion of group was provided by Melissa Hayden, Mudlogger of community education with [employer].  Melissa Hayden provided information on history of her local agency, mission statement, and the variety of unique services offered which group members might find beneficial to engage in, including both virtual and in-person support groups, as well as peer support program for mentoring.  Melissa Hayden offered time to answer member's questions regarding services and encouraged them to consider utilizing these services to assist in working towards their individual wellness goals.  Intervention effectiveness could not be measured, as Melissa Hayden was observed actively following along during speaker presentation, but did not participate in discussion.     Assessment and Plan: Counselor recommends that [facility] remain in IOP treatment to better manage mental health symptoms, ensure stability and pursue completion of treatment plan goals. Counselor recommends adherence to crisis/safety plan, taking medications as prescribed, and following up with medical professionals if any issues arise.   Follow Up  Instructions: Counselor will send Webex link for next  session. Melissa Hayden was advised to call back or seek an in-person evaluation if the symptoms worsen or if the condition fails to improve as anticipated.   I provided 180 minutes of non-face-to-face time during this encounter.   [name], LCSW, LCAS 11/20/21

## 2021-11-23 ENCOUNTER — Other Ambulatory Visit (HOSPITAL_COMMUNITY): Payer: 59 | Admitting: Licensed Clinical Social Worker

## 2021-11-23 ENCOUNTER — Encounter: Payer: Self-pay | Admitting: Registered Nurse

## 2021-11-23 ENCOUNTER — Other Ambulatory Visit: Payer: Self-pay

## 2021-11-23 DIAGNOSIS — F3160 Bipolar disorder, current episode mixed, unspecified: Secondary | ICD-10-CM | POA: Diagnosis not present
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
## 2021-11-24 ENCOUNTER — Other Ambulatory Visit (HOSPITAL_COMMUNITY): Payer: 59 | Admitting: Licensed Clinical Social Worker

## 2021-11-24 DIAGNOSIS — F3160 Bipolar disorder, current episode mixed, unspecified: Secondary | ICD-10-CM

## 2021-11-24 NOTE — Progress Notes (Signed)
Virtual Visit via Video Note   I connected with Melissa Hayden on 11/24/21 at  9:00 AM EDT by a video enabled telemedicine application and verified that I am speaking with the correct person using two identifiers.   At orientation to the IOP program, Case Manager discussed the limitations of evaluation and management by telemedicine and the availability of in person appointments. The patient expressed understanding and agreed to proceed with virtual visits throughout the duration of the program.   Location:  Patient: Patient Home Provider: [facility] [place] [facility]   History of Present Illness: Bipolar Disorder    Observations/Objective: Check In: Case Manager checked in with all participants to review discharge dates, insurance authorizations, work-related documents and needs from the treatment team regarding medications. Melissa Hayden stated needs and engaged in discussion.    Initial Therapeutic Activity: Counselor facilitated a check-in with Melissa Hayden to assess for safety, sobriety and medication compliance.  Counselor also inquired about Melissa Hayden current emotional ratings, as well as any significant changes in thoughts, feelings or behavior since previous check in.  Melissa Hayden presented for session on time and was alert, oriented x5, with no evidence or self-report of active SI/HI or A/V H.  Melissa Hayden reported compliance with medication and denied use of alcohol or illicit substances.  Melissa Hayden reported scores of 7/10 for depression, 3/10 for anxiety, and 0/10 for anger/irritability.  Melissa Hayden denied any recent outbursts or panic attacks.  Melissa Hayden reported that a recent struggle was making plans yesterday to stay productive, but not gaining motivation, stating I sat in my recliner and stared at a wall.  Melissa Hayden reported that she feels like she overwhelmed herself, and needs to adjust her a to-do list to ensure it is more realistic and achievable.  Melissa Hayden reported that her goal is to cook ribs tonight to treat herself for Valentine's Day.        Second Therapeutic Activity: Counselor introduced [employer], [place] Chaplain to provide psychoeducation on topic of Grief and Loss with members today.  Melissa Hayden began discussion by checking in with the group about their baseline mood today, general thoughts on what grief means to them and how it has affected them personally in the past.  Melissa Hayden provided information on how the process of grief/loss can differ depending upon one's unique culture, and categories of loss one could experience (i.e. loss of a person, animal, relationship, job, identity, etc).  Melissa Hayden encouraged members to be mindful of how pervasive loss can be, and how to recognize signs which could indicate that this is having an impact on one's overall mental health and wellbeing.  Intervention was effective, as evidenced by Melissa Hayden participating in discussion with speaker on the subject, reporting that her mother passed away when she was 85, so even now in adulthood she will notice herself feeling more anxious and considering shutting down as the anniversary gets closer.  Melissa Hayden reported that to cope, she spends more time with her pets, or seeks activities to keep her preoccupied for distraction.  Melissa Hayden stated I know I didn't properly grieve it, and I worry that if I don't stay busy it will take me to a dark place.    Third Therapeutic Activity:  Counselor introduced topic of 'urge surfing' today.  Counselor explained how this technique can be used to avoid acting upon a behavior that needs to be reduced or stopped completely.  Counselor provided common examples of maladaptive behaviors, such as smoking, overeating, substance use, excessive spending, lashing out emotionally, and more.  Counselor  explained how urges rarely last more than 30 minutes if they are not ruminated upon, and attempts to fight or suppress the urge can ultimately cause the problem to grow.  Counselor guided members through practice of combination mindfulness, relaxation,  and visualization exercises in order to surf an urge of their concern until it faded.  Intervention was effective, as evidenced by Melissa Hayden participating in urge surfing activity and reporting that she focused on the urge to shop, and stated This was very relaxing.  I was thinking about going shopping this afternoon, but over course of the activity I forgot about it completely.  I feel a lot calmer, and the impulse is gone.     Assessment and Plan: Counselor recommends that Melissa Hayden remain in IOP treatment to better manage mental health symptoms, ensure stability and pursue completion of treatment plan goals. Counselor recommends adherence to crisis/safety plan, taking medications as prescribed, and following up with medical professionals if any issues arise.   Follow Up Instructions: Counselor will send Webex link for next session. Melissa Hayden was advised to call back or seek an in-person evaluation if the symptoms worsen or if the condition fails to improve as anticipated.   I provided 180 minutes of non-face-to-face time during this encounter.   [name], LCSW, LCAS 11/24/21

## 2021-11-25 ENCOUNTER — Telehealth (HOSPITAL_COMMUNITY): Payer: 59 | Admitting: Psychiatry

## 2021-11-25 ENCOUNTER — Other Ambulatory Visit (HOSPITAL_COMMUNITY): Payer: 59 | Admitting: Licensed Clinical Social Worker

## 2021-11-25 ENCOUNTER — Other Ambulatory Visit: Payer: Self-pay

## 2021-11-25 DIAGNOSIS — F3160 Bipolar disorder, current episode mixed, unspecified: Secondary | ICD-10-CM

## 2021-11-25 NOTE — Progress Notes (Signed)
Virtual Visit via Video Note   I connected with Melissa Hayden on 11/25/21 at  9:00 AM EDT by a video enabled telemedicine application and verified that I am speaking with the correct person using two identifiers.   At orientation to the IOP program, Case Manager discussed the limitations of evaluation and management by telemedicine and the availability of in person appointments. The patient expressed understanding and agreed to proceed with virtual visits throughout the duration of the program.   Location:  Patient: Patient Home Provider: [facility] [place] [facility]   History of Present Illness: Bipolar Disorder    Observations/Objective: Check In: Case Manager checked in with all participants to review discharge dates, insurance authorizations, work-related documents and needs from the treatment team regarding medications. Melissa Hayden stated needs and engaged in discussion.    Initial Therapeutic Activity: Counselor facilitated a check-in with Melissa Hayden to assess for safety, sobriety and medication compliance.  Counselor also inquired about Melissa Hayden's current emotional ratings, as well as any significant changes in thoughts, feelings or behavior since previous check in.  Melissa Hayden presented for session on time and was alert, oriented x5, with no evidence or self-report of active SI/HI or A/V H.  Melissa Hayden reported compliance with medication and denied use of alcohol or illicit substances.  Melissa Hayden reported scores of 1/10 for depression, 8/10 for anxiety, and 0/10 for anger/irritability.  Melissa Hayden denied any recent outbursts.  Melissa Hayden reported that a recent struggle was experiencing a panic attack last night, stating I become a nervous wreck really easily.  Melissa Hayden reported that work stress is a major trigger for her, and when she thought about returning to work late last night, this caused panic, but she did practice relaxation techniques to cope, stating That did help.  Melissa Hayden reported that her goal today is to work on her budget in order to  alleviate financial stress.        Second Therapeutic Activity: Counselor introduced Melissa Hayden, [employer], to provide psychoeducation on topic of nutrition with members today.  Melissa Hayden virtually shared a comprehensive PowerPoint presentation to guide discussion, which featured the various components of healthy living that influence one's wellbeing, including practicing mindfulness, staying physically active, calm in mood, well-rested, and more.  Melissa Hayden explained how good nutrition reinforces positive physical and mental health, and shared a video which explained how food intake affects brain functioning in particular.  Melissa Hayden provided advice on how to adjust diet in order to promote wellbeing during course of treatment, including achieving balanced daily intake along with regular exercise.  Melissa Hayden offered the 'plate method' as a tool for proper distribution of protein, grains, starches, vegetables, fruit, and low calorie drink, in addition to concept of 'mindful eating', and covering current Dietary Guidelines for Americans for more tips.  Melissa Hayden inquired about changes members would like to make to their nutrition in order to increase overall wellbeing based upon information shared today, and time was allowed to ask any questions they might have of Melissa Hayden regarding her specialty.  Intervention was effective, as evidenced by [facility] Hayden participating in discussion with speaker on the subject, and reporting that one goal for her based upon the information presented in this discussion is to ensure that she gets enough exercise, noting that it can be a struggle to get out of the home at times due to anxiety.    Assessment and Plan: Counselor recommends that Melissa Hayden remain in IOP treatment to better manage mental health symptoms, ensure stability and pursue completion of treatment plan goals. Counselor recommends  adherence to crisis/safety plan, taking medications as prescribed, and following up with medical professionals if any  issues arise.   Follow Up Instructions: Counselor will send Webex link for next session. Melissa Hayden was advised to call back or seek an in-person evaluation if the symptoms worsen or if the condition fails to improve as anticipated.   I provided 180 minutes of non-face-to-face time during this encounter.   [name], LCSW, LCAS 11/25/21

## 2021-11-26 ENCOUNTER — Other Ambulatory Visit (HOSPITAL_COMMUNITY): Payer: 59 | Admitting: Psychiatry

## 2021-11-26 ENCOUNTER — Telehealth (HOSPITAL_COMMUNITY): Payer: Self-pay | Admitting: Psychiatry

## 2021-11-26 NOTE — Plan of Care (Signed)
Pt will improve her mood as evidenced by being happy again, managing her mood and coping with daily stressors for 5 out of 7 days for 60 days. °

## 2021-11-27 ENCOUNTER — Other Ambulatory Visit: Payer: Self-pay

## 2021-11-27 ENCOUNTER — Other Ambulatory Visit (HOSPITAL_COMMUNITY): Payer: 59 | Admitting: Licensed Clinical Social Worker

## 2021-11-27 DIAGNOSIS — F3160 Bipolar disorder, current episode mixed, unspecified: Secondary | ICD-10-CM | POA: Diagnosis not present

## 2021-11-27 NOTE — Progress Notes (Signed)
Virtual Visit via Video Note   I connected with Melissa Hayden on 11/27/21 at  9:00 AM EDT by a video enabled telemedicine application and verified that I am speaking with the correct person using two identifiers.   At orientation to the IOP program, Case Manager discussed the limitations of evaluation and management by telemedicine and the availability of in person appointments. The patient expressed understanding and agreed to proceed with virtual visits throughout the duration of the program.   Location:  Patient: Patient Home Provider: [facility] [place] [facility]   History of Present Illness: Bipolar Disorder    Observations/Objective: Check In: Case Manager checked in with all participants to review discharge dates, insurance authorizations, work-related documents and needs from the treatment team regarding medications. Melissa Hayden stated needs and engaged in discussion.    Initial Therapeutic Activity: Counselor facilitated a check-in with Melissa Hayden to assess for safety, sobriety and medication compliance.  Counselor also inquired about Melissa Hayden's current emotional ratings, as well as any significant changes in thoughts, feelings or behavior since previous check in.  Melissa Hayden presented for session on time and was alert, oriented x5, with no evidence or self-report of active SI/HI or A/V H.  Melissa Hayden reported compliance with medication and denied use of alcohol or illicit substances.  Melissa Hayden reported scores of 6/10 for depression, 8/10 for anxiety, and 0/10 for anger/irritability.  Melissa Hayden denied any recent outbursts.  Melissa Hayden reported that a recent struggle was experiencing a headache yesterday and missing group.  She reported that she also received news that her favorite manager at work will be leaving, so she is anxious about how this transition will affect her job duties, but stated I'm going to try to keep an open mind.  Melissa Hayden reported that after her headache subsided yesterday, she took dogs for a walk, listened to music, cleaned, and  started a new book to Make it a better day.      Second Therapeutic Activity: Counselor introduced topic of anger management today.  Counselor virtually shared a handout with members on this subject featuring a variety of coping skills, and facilitated discussion on these approaches.  Examples included raising awareness of anger triggers, practicing deep breathing, keeping an anger log to better understand episodes, using diversion activities to distract oneself for 30 minutes, taking a time out when necessary, and being mindful of warning signs tied to thoughts or behavior.  Counselor inquired about which techniques group members have used before, what has proved to be helpful, what their unique warning signs might be, as well as what they will try out in the future to assist with de-escalation.  Intervention was effective, as evidenced by Melissa Hayden participating in discussion on topic, reporting that she has had anger management problems for years, stating I know I have anger issues.  I get high off anger, and like how it feels.  It makes me energetic, powerful, and strong.  I know it's a problem and I want to work on it".  Melissa Hayden reported that her anger can be triggered when she is disappointed, experiences frustration at work, or feels out of control, and has attempted to cope with it before by wearing an emotional mask or drinking alcohol, which has only made her more volatile.  Melissa Hayden stated I haven't had alcohol in 10 years, but before that I dealt with alcoholism in my family and had to quit completely.  It was at a point where I was drinking every day.  I found out that I wasn't a  good drunk. I was mean and angry, and it was disrupting life with my friends and mom.  Melissa Hayden was receptive to several anger management strategies offered, including keeping an anger journal to express her feelings in healthier ways, talking out problems with supportive peers to ensure outlet, and trying guided meditation to relax and  learn to let go of minor annoyances.  Melissa Hayden reported that information provided in session today was helpful for her treatment goals.      Assessment and Plan: Counselor recommends that Melissa Hayden remain in IOP treatment to better manage mental health symptoms, ensure stability and pursue completion of treatment plan goals. Counselor recommends adherence to crisis/safety plan, taking medications as prescribed, and following up with medical professionals if any issues arise.   Follow Up Instructions: Counselor will send Webex link for next session. Melissa Hayden was advised to call back or seek an in-person evaluation if the symptoms worsen or if the condition fails to improve as anticipated.   Collaboration of Care:   Medication Management AEB Ricky Ala, NP                                           Case Manager AEB Dellia Nims, CNA   Patient/Guardian was advised Release of Information must be obtained prior to any record release in order to collaborate their care with an outside provider. Patient/Guardian was advised if they have not already done so to contact the registration department to sign all necessary forms in order for [name] to release information regarding their care.   Consent: Patient/Guardian gives verbal consent for treatment and assignment of benefits for services provided during this visit. Patient/Guardian expressed understanding and agreed to proceed.  I provided 180 minutes of non-face-to-face time during this encounter.   [name], LCSW, LCAS 11/27/21

## 2021-11-30 ENCOUNTER — Other Ambulatory Visit (HOSPITAL_COMMUNITY): Payer: 59 | Admitting: Licensed Clinical Social Worker

## 2021-11-30 ENCOUNTER — Other Ambulatory Visit: Payer: Self-pay | Admitting: Registered Nurse

## 2021-11-30 ENCOUNTER — Other Ambulatory Visit: Payer: Self-pay

## 2021-11-30 DIAGNOSIS — F3160 Bipolar disorder, current episode mixed, unspecified: Secondary | ICD-10-CM

## 2021-11-30 NOTE — Progress Notes (Signed)
Virtual Visit via Video Note   I connected with Eligha Bridegroom on 11/30/21 at  9:00 AM EDT by a video enabled telemedicine application and verified that I am speaking with the correct person using two identifiers.   At orientation to the IOP program, Case Manager discussed the limitations of evaluation and management by telemedicine and the availability of in person appointments. The patient expressed understanding and agreed to proceed with virtual visits throughout the duration of the program.   Location:  Patient: Patient Home Provider: [facility] [place] [facility]   History of Present Illness: Bipolar Disorder    Observations/Objective: Check In: Case Manager checked in with all participants to review discharge dates, insurance authorizations, work-related documents and needs from the treatment team regarding medications. [name] stated needs and engaged in discussion.    Initial Therapeutic Activity: Counselor facilitated a check-in with [name] to assess for safety, sobriety and medication compliance.  Counselor also inquired about [name] current emotional ratings, as well as any significant changes in thoughts, feelings or behavior since previous check in.  [name] presented for session on time and was alert, oriented x5, with no evidence or self-report of active SI/HI or A/V H.  [name] reported compliance with medication and denied use of alcohol or illicit substances.  [name] reported scores of 2/10 for depression, 2/10 for anxiety, and 0/10 for anger/irritability.  [name] denied any recent outbursts or panic attacks.  [name] reported that a recent success was getting out of the home to do some shopping for groceries and thrift items to sell, as well as visit a friend, stating It was good to catch up with them because we haven't caught up in months.  [name] reported that a recent struggle has been postponing some cleaning duties around the home, although her goal today is to break up small tasks throughout the week in order  to make progress.         Second Therapeutic Activity: Counselor provided psychoeducation on subject of boundaries with group members today using a virtual handout.  This handout defined boundaries as the limits and rules that we set for ourselves within relationships, and featured a breakdown of the 3 common categories of boundaries (i.e. porous, rigid, and healthy), along with typical traits specific to each one for easy identification.  It was noted that most people have a mixture of different boundary types depending on setting, person, and culture.  Additional information was provided on the types of boundaries (i.e. physical, intellectual, emotional, sexual, material, and time) within relationships, and what could be considered healthy versus unhealthy. Counselor tasked members with identifying what types of boundaries they presently hold within her own support systems, the collective impact these boundaries have upon their mental health, and changes that could be made in order to more effectively communicate individual mental health needs.  Intervention was effective, as evidenced by [facility] participating in discussion on topic, reporting that she has maintained rigid boundaries for much of her life due to traits such as being unlikely to ask for help, avoiding intimacy and close relationships, being protective of personal information, appearing detached, and keeping others at a distance to avoid rejection.  [name] stated I've always been like that. I would starve before asking for help.  [name] reported that this has led her to have a small support network and isolated her at home, which is one reason she sought group therapy.  [name] stated, Its hard when you live your life in a way where there are few  people you trust or can rely on.  [name] was receptive to information offered and reported that she would continue using MHIOP setting to improve communication skills and adjust boundaries so that she can open up  more with supportive peers safely.    Assessment and Plan: Counselor recommends that [facility] remain in IOP treatment to better manage mental health symptoms, ensure stability and pursue completion of treatment plan goals. Counselor recommends adherence to crisis/safety plan, taking medications as prescribed, and following up with medical professionals if any issues arise.   Follow Up Instructions: Counselor will send Webex link for next session. [name] was advised to call back or seek an in-person evaluation if the symptoms worsen or if the condition fails to improve as anticipated.   Collaboration of Care:   Medication Management AEB Ricky Ala, NP                                           Case Manager AEB Dellia Nims, CNA   Patient/Guardian was advised Release of Information must be obtained prior to any record release in order to collaborate their care with an outside provider. Patient/Guardian was advised if they have not already done so to contact the registration department to sign all necessary forms in order for [name] to release information regarding their care.   Consent: Patient/Guardian gives verbal consent for treatment and assignment of benefits for services provided during this visit. Patient/Guardian expressed understanding and agreed to proceed.  I provided 180 minutes of non-face-to-face time during this encounter.   [name], LCSW, LCAS 11/30/21

## 2021-11-30 NOTE — Telephone Encounter (Signed)
Patient is requesting a refill of the following medications: Requested Prescriptions   Pending Prescriptions Disp Refills   FLUoxetine (PROZAC) 20 MG capsule [Pharmacy Med Name: FLUOXETINE HCL 20 MG CAPSULE] 90 capsule 0    Sig: TAKE 1 CAPSULE BY MOUTH EVERY DAY    Date of patient request: 11/30/2021 Last office visit: 09/25/2021 video Date of last refill: 09/01/2021 Last refill amount: 90 tablets Follow up time period per chart:

## 2021-12-01 ENCOUNTER — Other Ambulatory Visit (HOSPITAL_COMMUNITY): Payer: 59 | Admitting: Licensed Clinical Social Worker

## 2021-12-01 DIAGNOSIS — F3160 Bipolar disorder, current episode mixed, unspecified: Secondary | ICD-10-CM

## 2021-12-01 NOTE — Progress Notes (Signed)
Virtual Visit via Video Note   I connected with Melissa Hayden on 12/01/21 at  9:00 AM EDT by a video enabled telemedicine application and verified that I am speaking with the correct person using two identifiers.   At orientation to the IOP program, Case Manager discussed the limitations of evaluation and management by telemedicine and the availability of in person appointments. The patient expressed understanding and agreed to proceed with virtual visits throughout the duration of the program.   Location:  Patient: Patient Home Provider: [facility] [place] [facility]   History of Present Illness: Bipolar Disorder    Observations/Objective: Check In: Case Manager checked in with all participants to review discharge dates, insurance authorizations, work-related documents and needs from the treatment team regarding medications. Melissa Hayden stated needs and engaged in discussion.    Initial Therapeutic Activity: Counselor facilitated a check-in with Melissa Hayden to assess for safety, sobriety and medication compliance.  Counselor also inquired about Melissa Hayden's current emotional ratings, as well as any significant changes in thoughts, feelings or behavior since previous check in.  Melissa Hayden presented for session on time and was alert, oriented x5, with no evidence or self-report of active SI/HI or A/V H.  Melissa Hayden reported compliance with medication and denied use of alcohol or illicit substances.  Melissa Hayden reported scores of 8/10 for depression, 8/10 for anxiety, and 0/10 for anger/irritability.  Melissa Hayden denied any recent outbursts or panic attacks.  Melissa Hayden reported that a recent struggle influencing her ratings today was learning that her FMLA paperwork was not handled properly, so she is concerned about financial stability, and having to use [employer] and family support to offset lack of income.  Counselor and group members offered support and resources that could benefit Melissa Hayden at this time, including food banks that are near her home.  Melissa Hayden was  appreciative of this support, and reported that her goal today will be to outreach some of the local churches and food banks for assistance if her check does not deposit this evening as scheduled.         Second Therapeutic Activity: Counselor introduced [employer], [facility] to provide psychoeducation on topic of Grief and Loss with members today.  Melissa Hayden began discussion by checking in with the group about their baseline mood today, general thoughts on what grief means to them and how it has affected them personally in the past.  Melissa Hayden provided information on how the process of grief/loss can differ depending upon one's unique culture, and categories of loss one could experience (i.e. loss of a person, animal, relationship, job, identity, etc).  Melissa Hayden encouraged members to be mindful of how pervasive loss can be, and how to recognize signs which could indicate that this is having an impact on one's overall mental health and wellbeing.  Intervention was effective, as evidenced by [name] participating in discussion with speaker on the subject, reporting that she is more motivated to begin grief counseling upon completing MHIOP, stating I've lost a lot of people close to me, and I've never figured out how to grieve.  I don't know where I would begin, but I know I need to do it because grief interferes with my life every day. Melissa Hayden reported that she would plan to outreach Melissa Hayden about grief counseling services this week.     Assessment and Plan: Counselor recommends that [facility] remain in IOP treatment to better manage mental health symptoms, ensure stability and pursue completion of treatment plan goals. Counselor recommends adherence to crisis/safety plan, taking medications as  prescribed, and following up with medical professionals if any issues arise.   Follow Up Instructions: Counselor will send Webex link for next session. Melissa Hayden was advised to call back or seek an in-person evaluation if the  symptoms worsen or if the condition fails to improve as anticipated.   Collaboration of Care:   Medication Management AEB Ricky Ala, NP                                           Case Manager AEB Dellia Nims, CNA   Patient/Guardian was advised Release of Information must be obtained prior to any record release in order to collaborate their care with an outside provider. Patient/Guardian was advised if they have not already done so to contact the registration department to sign all necessary forms in order for [name] to release information regarding their care.   Consent: Patient/Guardian gives verbal consent for treatment and assignment of benefits for services provided during this visit. Patient/Guardian expressed understanding and agreed to proceed.  I provided 180 minutes of non-face-to-face time during this encounter.   [name], LCSW, LCAS 12/01/21

## 2021-12-02 ENCOUNTER — Ambulatory Visit (HOSPITAL_COMMUNITY): Payer: 59 | Admitting: Psychiatry

## 2021-12-02 ENCOUNTER — Other Ambulatory Visit: Payer: Self-pay | Admitting: Registered Nurse

## 2021-12-02 ENCOUNTER — Telehealth (HOSPITAL_COMMUNITY): Payer: Self-pay | Admitting: Psychiatry

## 2021-12-02 ENCOUNTER — Other Ambulatory Visit (HOSPITAL_COMMUNITY): Payer: Self-pay | Admitting: Psychiatry

## 2021-12-02 DIAGNOSIS — F3160 Bipolar disorder, current episode mixed, unspecified: Secondary | ICD-10-CM

## 2021-12-03 ENCOUNTER — Other Ambulatory Visit: Payer: Self-pay

## 2021-12-03 ENCOUNTER — Other Ambulatory Visit (HOSPITAL_COMMUNITY): Payer: 59 | Admitting: Licensed Clinical Social Worker

## 2021-12-03 DIAGNOSIS — F3160 Bipolar disorder, current episode mixed, unspecified: Secondary | ICD-10-CM | POA: Diagnosis not present

## 2021-12-03 NOTE — Progress Notes (Signed)
Virtual Visit via Video Note   I connected with Eligha Bridegroom on 12/03/21 at  9:00 AM EDT by a video enabled telemedicine application and verified that I am speaking with the correct person using two identifiers.   At orientation to the IOP program, Case Manager discussed the limitations of evaluation and management by telemedicine and the availability of in person appointments. The patient expressed understanding and agreed to proceed with virtual visits throughout the duration of the program.   Location:  Patient: Patient Home Provider: [facility] [place] [facility]   History of Present Illness: Bipolar Disorder    Observations/Objective: Check In: Case Manager checked in with all participants to review discharge dates, insurance authorizations, work-related documents and needs from the treatment team regarding medications. Saydi stated needs and engaged in discussion.    Initial Therapeutic Activity: Counselor facilitated a check-in with Markan to assess for safety, sobriety and medication compliance.  Counselor also inquired about Erum's current emotional ratings, as well as any significant changes in thoughts, feelings or behavior since previous check in.  Cyndi presented for session on time and was alert, oriented x5, with no evidence or self-report of active SI/HI or A/V H.  Zahira reported compliance with medication and denied use of alcohol or illicit substances.  Marquelle reported scores of 2/10 for depression, 2/10 for anxiety, and 0/10 for anger/irritability.  Nicolina denied any recent outbursts or panic attacks.  Beckey reported that a recent struggle was not receiving her money as expected the previous night, and having to miss group yesterday in order to figure out how she would pay off bills and afford food.  Alveda reported that a success was trying to stay calm, and coordinating with her work representative that day in order to resolve the issue, stating That was a huge relief because I was able to pay off  everything finally.  Hatsumi reported that her goal today is to get out of the house and do some thrifting and take her dogs for a walk.         Second Therapeutic Activity: Counselor introduced topic of self-care today.  Counselor explained how this can be defined as the things one does to maintain good health and improve well-being.  Counselor provided members with a self-care assessment form to complete.  This handout featured various sub-categories of self-care, including physical, psychological/emotional, social, spiritual, and professional.  Members were asked to rank their engagement in the activities listed for each dimension on a scale of 1-3, with 1 indicating 'Poor', 2 indicating '[place]', and 3 indicating 'Well'.  Counselor invited members to share results of their assessment, and inquired about which areas of self-care they are doing well in, as well as areas that require attention, and how they plan to begin addressing this during treatment. Intervention was effective, as evidenced by [facility] successfully completing initial 2 sections of assessment and actively engaging in discussion on subject, reporting that she is excelling in areas such as resting when sick, participating in fun activities, eating regularly, taking time off from work, going on vacations or daytrips, and doing comforting activities, but would benefit from focusing more on areas such as maintaining personal hygiene, wearing clothes that make her feel good, getting enough sleep, going to preventative medical appointments, learning new things, getting away from distractions, and expressing feelings in healthy ways.  Miranda reported that she would work to improve self-care deficits by setting reminders to take showers more frequently, using relaxation techniques at bedtime and/or participating in a  sleep study to explore solutions to address erratic sleep patterns, scheduling an appointment with her cardiologist to meet within the next 3 months,  setting a time boundary to cut back on excessive internet and phone use, watch more scientific documentaries, continue with therapy upon discharge to ensure outlet for healthy expression of feelings, and revisit nail art as a hobby.    Assessment and Plan: Counselor recommends that [facility] remain in IOP treatment to better manage mental health symptoms, ensure stability and pursue completion of treatment plan goals. Counselor recommends adherence to crisis/safety plan, taking medications as prescribed, and following up with medical professionals if any issues arise.   Follow Up Instructions: Counselor will send Webex link for next session. Zuleyma was advised to call back or seek an in-person evaluation if the symptoms worsen or if the condition fails to improve as anticipated.   Collaboration of Care:   Medication Management AEB Ricky Ala, NP                                           Case Manager AEB Dellia Nims, CNA   Patient/Guardian was advised Release of Information must be obtained prior to any record release in order to collaborate their care with an outside provider. Patient/Guardian was advised if they have not already done so to contact the registration department to sign all necessary forms in order for [name] to release information regarding their care.   Consent: Patient/Guardian gives verbal consent for treatment and assignment of benefits for services provided during this visit. Patient/Guardian expressed understanding and agreed to proceed.  I provided 180 minutes of non-face-to-face time during this encounter.   [name], LCSW, LCAS 12/03/21

## 2021-12-04 ENCOUNTER — Other Ambulatory Visit (HOSPITAL_COMMUNITY): Payer: 59 | Admitting: Licensed Clinical Social Worker

## 2021-12-04 ENCOUNTER — Other Ambulatory Visit: Payer: Self-pay

## 2021-12-04 DIAGNOSIS — F3160 Bipolar disorder, current episode mixed, unspecified: Secondary | ICD-10-CM | POA: Diagnosis not present

## 2021-12-04 NOTE — Progress Notes (Signed)
Virtual Visit via Video Note   I connected with Eligha Bridegroom on 12/04/21 at  9:00 AM EDT by a video enabled telemedicine application and verified that I am speaking with the correct person using two identifiers.   At orientation to the IOP program, Case Manager discussed the limitations of evaluation and management by telemedicine and the availability of in person appointments. The patient expressed understanding and agreed to proceed with virtual visits throughout the duration of the program.   Location:  Patient: Patient Home Provider: [facility] [place] [facility]   History of Present Illness: Bipolar Disorder    Observations/Objective: Check In: Case Manager checked in with all participants to review discharge dates, insurance authorizations, work-related documents and needs from the treatment team regarding medications. Delita stated needs and engaged in discussion.    Initial Therapeutic Activity: Counselor facilitated a check-in with Maridel to assess for safety, sobriety and medication compliance.  Counselor also inquired about Andilynn's current emotional ratings, as well as any significant changes in thoughts, feelings or behavior since previous check in.  Estefanie presented for session on time and was alert, oriented x5, with no evidence or self-report of active SI/HI or A/V H.  Yomayra reported compliance with medication and denied use of alcohol or illicit substances.  Amayiah reported scores of 0/10 for depression, 3/10 for anxiety, and 0/10 for anger/irritability.  Shaquina denied any recent outbursts or panic attacks.  Kathrin reported that a recent success was picking up groceries, taking her dogs for several walks, and relaxing.   Melvinia denied any present struggles.  She reported that her goal this weekend is to work on cleaning the house, pick up a new book, and post some ads on [employer].        Second Therapeutic Activity: Counselor introduced topic of grounding skills today.  Counselor defined these as simple strategies  one can use to help detach from difficult thoughts or feelings temporarily by focusing on something else.  Counselor noted that grounding will not solve the problem at hand, but can provide the practitioner with time to regain control over their thoughts and/or feelings and prevent the situation from getting worse (i.e. interrupting a panic attack).  Counselor divided these into three categories (mental, physical, and soothing) and then provided examples of each which group members could practice during session.  Some of these included describing one's environment in detail or playing a categories game with oneself for mental category, taking a hot bath/shower, stretching, or carrying a grounding object for physical category, and saying kind statements, or visualizing people one cares about for soothing category.  Counselor inquired about which techniques members have used with success in the past, or will commit to learning, practicing, and applying now to improve coping abilities.  Intervention was effective, as evidenced by [facility] participating in discussion on the subject, trying out several of the techniques during session, and expressing interest in adding several to her available coping skills, such as describing her environment in detail, using her imagination to picture herself relaxing at the beach, reading a mystery novel or biography, splashing her face with cold water,  chewing gum, using a weighted blanket, eating a sucker and focusing on the sensory elements of it, reciting kind statements to herself such as You are strong, beautiful person with a kind heart, and will get through this, or planning a safe treat for herself such as cooking a nice meal of pasta.  Dicie stated I think that these will be helpful.  Third Therapeutic Activity: Psycho-educational portion of group was provided by Christie Beckers, [employer] of community education with [employer].  Alexandra provided information on  history of her local agency, mission statement, and the variety of unique services offered which group members might find beneficial to engage in, including both virtual and in-person support groups, as well as peer support program for mentoring.  Alexandra offered time to answer member's questions regarding services and encouraged them to consider utilizing these services to assist in working towards their individual wellness goals.  Intervention was effective, as evidenced by [facility] participating in discussion with speaker on the subject, reporting that she is interested in starting individual therapy with [facility] once she discharges from [place].    Assessment and Plan: Counselor recommends that [facility] remain in IOP treatment to better manage mental health symptoms, ensure stability and pursue completion of treatment plan goals. Counselor recommends adherence to crisis/safety plan, taking medications as prescribed, and following up with medical professionals if any issues arise.   Follow Up Instructions: Counselor will send Webex link for next session. Nickie was advised to call back or seek an in-person evaluation if the symptoms worsen or if the condition fails to improve as anticipated.   Collaboration of Care:   Medication Management AEB Ricky Ala, NP                                           Case Manager AEB Dellia Nims, CNA   Patient/Guardian was advised Release of Information must be obtained prior to any record release in order to collaborate their care with an outside provider. Patient/Guardian was advised if they have not already done so to contact the registration department to sign all necessary forms in order for [name] to release information regarding their care.   Consent: Patient/Guardian gives verbal consent for treatment and assignment of benefits for services provided during this visit. Patient/Guardian expressed understanding and agreed to proceed.  I provided 160 minutes of  non-face-to-face time during this encounter.   [name], LCSW, LCAS 12/04/21

## 2021-12-05 ENCOUNTER — Other Ambulatory Visit: Payer: Self-pay | Admitting: Registered Nurse

## 2021-12-05 DIAGNOSIS — G43709 Chronic migraine without aura, not intractable, without status migrainosus: Secondary | ICD-10-CM

## 2021-12-05 DIAGNOSIS — F3160 Bipolar disorder, current episode mixed, unspecified: Secondary | ICD-10-CM

## 2021-12-07 ENCOUNTER — Other Ambulatory Visit: Payer: Self-pay

## 2021-12-07 ENCOUNTER — Other Ambulatory Visit (HOSPITAL_COMMUNITY): Payer: 59 | Admitting: Licensed Clinical Social Worker

## 2021-12-07 DIAGNOSIS — F3160 Bipolar disorder, current episode mixed, unspecified: Secondary | ICD-10-CM | POA: Diagnosis not present

## 2021-12-07 NOTE — Progress Notes (Signed)
Virtual Visit via Video Note   I connected with Melissa Hayden on 12/07/21 at  9:00 AM EDT by a video enabled telemedicine application and verified that I am speaking with the correct person using two identifiers.   At orientation to the IOP program, Case Manager discussed the limitations of evaluation and management by telemedicine and the availability of in person appointments. The patient expressed understanding and agreed to proceed with virtual visits throughout the duration of the program.   Location:  Patient: Patient Home Provider: [facility] [place] [facility]   History of Present Illness: Bipolar Disorder    Observations/Objective: Check In: Case Manager checked in with all participants to review discharge dates, insurance authorizations, work-related documents and needs from the treatment team regarding medications. Melissa Hayden stated needs and engaged in discussion.    Initial Therapeutic Activity: Counselor facilitated a check-in with Melissa Hayden to assess for safety, sobriety and medication compliance.  Counselor also inquired about Melissa Hayden's current emotional ratings, as well as any significant changes in thoughts, feelings or behavior since previous check in.  Melissa Hayden presented for session on time and was alert, oriented x5, with no evidence or self-report of active SI/HI or A/V H.  Melissa Hayden reported compliance with medication and denied use of alcohol or illicit substances.  Melissa Hayden reported scores of 5/10 for depression, 7/10 for anxiety, and 0/10 for anger/irritability.  Melissa Hayden denied any recent outbursts or panic attacks.  Melissa Hayden reported that a recent success was spending the weekend relaxing and sleeping more than normal, stating I did read a bit though, pick up some groceries, and take the dogs for walks.  That was nice.  Melissa Hayden reported that a recent struggle was learning that a friend relapsed, stating That was sad to hear.  Melissa Hayden reported that her goal today is to get outside of the home and pick up some medications, and  then clean her bathroom.       Second Therapeutic Activity: Counselor engaged the group in discussion on managing work/life balance today to improve mental health and wellness.  Counselor explained how finding balance between responsibilities at home and work place can be challenging, lead to increased stress, and this has been further complicated by recent pandemic leading to unemployment, more virtual work, and blurring of lines between home as a place of rest or work duties.  Counselor facilitated discussion on what challenges members have faced with this issue historically, as well as what, if any, issues have arisen following pandemic. Counselor also discussed strategies for improving work/life balance while members work on their mental health during treatment.  Some of these included keeping track of time management; creating a list of priorities and scaling importance; setting realistic, measurable goals each day; establishing boundaries; taking care of health needs; and nurturing relationships at home and work for support.  Counselor inquired about areas where members feel they are excelling, as well as areas they could focus on during treatment.  Interventions were effective, as evidenced by [facility] actively participating in discussion on subject, reporting that she has been struggling with burnout from her job for some time now, and several symptoms indicate a work life imbalance, including worrying about work 24/7 outside of normal hours, losing sleep over this, feeling emotionally and physically drained at and outside of work, and lacking motivation to do things she enjoys.  Melissa Hayden was receptive to several strategies that were offered to correct this imbalance, including creating a to do list each morning to make her schedule more predictable and ensure  that it includes time for self-care activities such as spending time with friends, getting more physical exercise, and exploring new hobbies such as  reading.  Melissa Hayden reported that she would also like to become more assertive in order to decline unreasonable requests at work and set healthier boundaries.    Assessment and Plan: Counselor recommends that Melissa Hayden remain in IOP treatment to better manage mental health symptoms, ensure stability and pursue completion of treatment plan goals. Counselor recommends adherence to crisis/safety plan, taking medications as prescribed, and following up with medical professionals if any issues arise.   Follow Up Instructions: Counselor will send Webex link for next session. Tamyia was advised to call back or seek an in-person evaluation if the symptoms worsen or if the condition fails to improve as anticipated.   Collaboration of Care:   Medication Management AEB Ricky Ala, NP                                           Case Manager AEB Dellia Nims, CNA   Patient/Guardian was advised Release of Information must be obtained prior to any record release in order to collaborate their care with an outside provider. Patient/Guardian was advised if they have not already done so to contact the registration department to sign all necessary forms in order for [name] to release information regarding their care.   Consent: Patient/Guardian gives verbal consent for treatment and assignment of benefits for services provided during this visit. Patient/Guardian expressed understanding and agreed to proceed.  I provided 180 minutes of non-face-to-face time during this encounter.   [name], LCSW, LCAS 12/07/21

## 2021-12-08 ENCOUNTER — Other Ambulatory Visit: Payer: Self-pay

## 2021-12-08 ENCOUNTER — Other Ambulatory Visit (HOSPITAL_COMMUNITY): Payer: 59 | Admitting: Family

## 2021-12-08 ENCOUNTER — Telehealth (HOSPITAL_COMMUNITY): Payer: 59 | Admitting: Psychiatry

## 2021-12-08 DIAGNOSIS — F3160 Bipolar disorder, current episode mixed, unspecified: Secondary | ICD-10-CM

## 2021-12-08 NOTE — Progress Notes (Signed)
Virtual Visit via Video Note  I connected with Melissa Hayden on @TODAY @ at  9:00 AM EST by a video enabled telemedicine application and verified that I am speaking with the correct person using two identifiers.  Location: Patient: at home Provider: at office   I discussed the limitations of evaluation and management by telemedicine and the availability of in person appointments. The patient expressed understanding and agreed to proceed.  I discussed the assessment and treatment plan with the patient. The patient was provided an opportunity to ask questions and all were answered. The patient agreed with the plan and demonstrated an understanding of the instructions.   The patient was advised to call back or seek an in-person evaluation if the symptoms worsen or if the condition fails to improve as anticipated.  I provided 30 minutes of non-face-to-face time during this encounter.   Melissa Hayden, M.Ed,CNA   Patient ID: Melissa Hayden, female   DOB: [birth year], 46 y.o.   MRN: [id number] As per previous CCA states:  This is a 46 yr old, divorced, employed, Caucasian female who was referred per Dr. Adele Hayden; treatment for worsening depressive and anxiety symptoms.  Denies SI/HI or A/V hallucinations.  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  "I have had to learn six other job tasks and it's been very difficult.  I am also on the phone all day long."  2) Financial Strain.  Pt admits to one prior inpt psych admit Melissa Hayden) in 2007 d/t depression/anxiety with a suicide attempt.  Admits to another prior suicide attempt four yrs ago.  Has hx seeing a therapist (one appt) whenever her oldest sister died.  States her niece is her support system.  She resides in [place].   Current Symptoms/Problems: Racing, ruminating thoughts, fatigue, poor sleep (can go two days without sleep), anhedonia, no motivation, poor concentration, decreased appetite (lost  12lbs within 2 months ), decreased ADL's, not cleaning home, isolative, sadness, anxious  Pt successfully completed MH-IOP today.  Pt states she is feeling very anxious about returning to work on 12-14-21.  "I wasn't able to sleep at all lastnight d/t worrying about my rtw.  I feel like I am just not ready."  On a scale of 1-10 (10 being the worst); pt rates her anxiety at a 8 and depression at a 3.  Denies SI/HI or A/V hallucinations.  States that the groups were very helpful and she is glad that she attended.  Inquired if pt has been applying the skills she has learned.  "Yes, I've been practicing my skills, but I am just very anxious and scared about returning to work."  Pt has an upcoming appt with Dr. Adele Hayden and she plans to ask him about options re: work.  A:  D/C today.  F/U with Dr. Adele Hayden on 12-11-21 @ 11 a.m..  Inquired about a therapist for pt; pt would like to f/u with Melissa Hayden [phone].  Case manager mentioned the possibility of an EAP also.  Pt is tentatively scheduled to RTW on 12-14-21, unless Dr. Adele Hayden extends.  Will provide his nursing staff with RTW form just in case.  R:  Pt receptive.  Melissa Hayden, M.Ed,CNA

## 2021-12-08 NOTE — Patient Instructions (Signed)
D:  Patient successfully completed MH-IOP today.  A:  Discharge today.  Follow up with Dr. Adele Schilder on 12-11-21 @ 11 a.m.Marland Kitchen  Strongly recommended pt to follow up with [facility] for groups [phone].  Pt tentatively scheduled to RTW on 12-14-21; but pt would like to discuss with Dr. Adele Schilder first.  R:  Patient receptive.

## 2021-12-08 NOTE — Progress Notes (Signed)
Virtual Visit via Video Note   I connected with Melissa Hayden on 12/08/21 at  9:00 AM EDT by a video enabled telemedicine application and verified that I am speaking with the correct person using two identifiers.   At orientation to the IOP program, Case Manager discussed the limitations of evaluation and management by telemedicine and the availability of in person appointments. The patient expressed understanding and agreed to proceed with virtual visits throughout the duration of the program.   Location:  Patient: Patient Home Provider: [facility] [place] Office   History of Present Illness: Bipolar Disorder    Observations/Objective: Check In: Case Manager checked in with all participants to review discharge dates, insurance authorizations, work-related documents and needs from the treatment team regarding medications. Melissa Hayden stated needs and engaged in discussion.    Initial Therapeutic Activity: Counselor facilitated a check-in with Melissa Hayden to assess for safety, sobriety and medication compliance.  Counselor also inquired about Melissa Hayden's current emotional ratings, as well as any significant changes in thoughts, feelings or behavior since previous check in.  Melissa Hayden presented for session on time and was alert, oriented x5, with no evidence or self-report of active SI/HI or A/V H.  Melissa Hayden reported compliance with medication and denied use of alcohol or illicit substances.  Melissa Hayden reported scores of 4/10 for depression, 8/10 for anxiety, and 0/10 for anger/irritability.  Melissa Hayden denied any recent outbursts.  Melissa Hayden reported that a recent struggle was making plans yesterday to take care of some tasks, but not accomplishing any of them, stating I was so disappointed in myself.  She reported that she also experienced a panic attack last night when she was 'hyperfocused' upon changes at her job she will have to adapt to when she returns next week.  Melissa Hayden reported that a success was using relaxation techniques and her medicine to help  cope.  Melissa Hayden reported that her goal today is to take a nap in order to get some energy back, and make an effort to focus more on being present, with less overthinking about the future.        Second Therapeutic Activity: Counselor invited members to participate in peaceful place guided imagery activity today.  Counselor explained how this is a powerful visualization tool which can aid in reducing stress while increasing sense of calm, control, and awareness if practiced regularly.  Counselor informed members beforehand that if they became uncomfortable at any point during activity, they could stop and open their eyes.  Counselor invited members to get comfortable, achieve a relaxing breathing rhythm, close their eyes, and then guided them through process of creating a 'peaceful place' which filled them with safety and calm.  Counselor encouraged members to include sensory details involving vision, sound, touch, smell, and taste which they considered pleasant to enhance experience.  After 10 minutes of practice in session, counselor invited members to share their opinion on the activity, including whether they were able to imagine a specific place, what details stood out to them, and how this made them feel during and after.  Intervention was effective, as evidenced by [facility] successfully participating in activity, and stating I went to place from the past, and used my creativity to remove some people from it so there was less talking and noise to distract me.  I feel incredibly relaxed and that's nice considering how I felt for the last 24 hours.  Melissa Hayden reported that she would plan to practice this when worrying about the future, and help her fall asleep  more easily at bedtime.     Third Therapeutic Activity: Counselor introduced [employer], [place] Chaplain to provide psychoeducation on topic of Grief and Loss with members today.  Melissa Hayden began discussion by checking in with the group about their baseline  mood today, general thoughts on what grief means to them and how it has affected them personally in the past.  Melissa Hayden provided information on how the process of grief/loss can differ depending upon one's unique culture, and categories of loss one could experience (i.e. loss of a person, animal, relationship, job, identity, etc).  Melissa Hayden encouraged members to be mindful of how pervasive loss can be, and how to recognize signs which could indicate that this is having an impact on one's overall mental health and wellbeing.  Intervention was effective, as evidenced by [facility] participating in discussion with speaker on the subject, reporting that something which brings her joy and helps her cope with loss is spending time with her 3 dogs since they bring her comfort.    Assessment and Plan: Melissa Hayden has completed MHIOP at this time and will discharge today, agreeing to follow up with her care team to ensure ongoing stability.  Counselor recommends adherence to crisis/safety plan, taking medications as prescribed, and following up with medical professionals if any issues arise.   Follow Up Instructions: Melissa Hayden was advised to call back or seek an in-person evaluation if the symptoms worsen or if the condition fails to improve as anticipated.   Collaboration of Care:   Medication Management AEB Ricky Ala, NP                                           Case Manager AEB Dellia Nims, CNA   Patient/Guardian was advised Release of Information must be obtained prior to any record release in order to collaborate their care with an outside provider. Patient/Guardian was advised if they have not already done so to contact the registration department to sign all necessary forms in order for [name] to release information regarding their care.   Consent: Patient/Guardian gives verbal consent for treatment and assignment of benefits for services provided during this visit. Patient/Guardian expressed understanding and agreed to  proceed.  I provided 180 minutes of non-face-to-face time during this encounter.   [name], LCSW, LCAS 12/08/21

## 2021-12-09 ENCOUNTER — Encounter (HOSPITAL_COMMUNITY): Payer: Self-pay | Admitting: Family

## 2021-12-09 ENCOUNTER — Ambulatory Visit (HOSPITAL_COMMUNITY): Payer: 59

## 2021-12-09 MED ORDER — RIZATRIPTAN BENZOATE 10 MG PO TABS: 10.0000 mg | ORAL_TABLET | ORAL | 0 refills | Status: DC | PRN

## 2021-12-09 MED ORDER — CLONAZEPAM 0.5 MG PO TBDP: 0.5000 mg | ORAL_TABLET | Freq: Three times a day (TID) | ORAL | 0 refills | Status: DC | PRN

## 2021-12-09 NOTE — Telephone Encounter (Signed)
Patient is requesting a refill of the following medications: ?Requested Prescriptions  ? ?Pending Prescriptions Disp Refills  ? clonazePAM (KLONOPIN) 0.5 MG disintegrating tablet 30 tablet 0  ?  Sig: Take 1 tablet (0.5 mg total) by mouth 3 (three) times daily as needed for seizure.  ? rizatriptan (MAXALT) 10 MG tablet 10 tablet 0  ?  Sig: May repeat in 2 hours if needed  ? ? ?Date of patient request: 12/05/2021 ?Last office visit: 09/25/2021 virtual ?Date of last refill: 11/13/2021 ?Last refill amount: 30 tablets ?Follow up time period per chart: none ? ?

## 2021-12-09 NOTE — Progress Notes (Signed)
Virtual Visit via Video Note  I connected with Melissa Hayden on 12/09/21 at  9:00 AM EST by a video enabled telemedicine application and verified that I am speaking with the correct person using two identifiers.  Location: Patient: Home Provider: Office   I discussed the limitations of evaluation and management by telemedicine and the availability of in person appointments. The patient expressed understanding and agreed to proceed.  I discussed the assessment and treatment plan with the patient. The patient was provided an opportunity to ask questions and all were answered. The patient agreed with the plan and demonstrated an understanding of the instructions.   The patient was advised to call back or seek an in-person evaluation if the symptoms worsen or if the condition fails to improve as anticipated.  I provided 15 minutes of non-face-to-face time during this encounter.   Derrill Center, NP   [facility] Behavioral Health Intensive Outpatient Program Discharge Summary  Melissa Hayden [id number]  Admission date: 11/18/2021 Discharge date: 12/08/2021  Reason for admission: per admission assessment note: Melissa Hayden is a 46 year old female that presents with worsening depression and anxiety.  She reports a history of major depressive disorder, bipolar disorder and generalized anxiety disorder.  Reports history of inpatient admissions.  With 2 previous suicide attempts.  Reports her last suicide attempt was 4 years ago.  Reports decline in her ADLs states she has been out of able to focus or concentrate at work.  States she is currently employed by [employer] however due to increased work demands caused her depression and anxiety to intensify.  Progress in Program Toward Treatment Goals: Progressing-ongoing, Kennedey attended participated with daily group session with active and engaged participation.  Denying suicidal or homicidal ideations.  Denies auditory visual hallucinations.  She  declined any medication refills at discharge.  States group has been helpful and progressive.  States she is hoping to return to work feels and would consider returning at a later date.  Progress (rationale): Keep follow-up with MD Arfeen 12/11/2021 seeking to establish care with therapy services.   Take all medications as prescribed. Keep all follow-up appointments as scheduled.  Do not consume alcohol or use illegal drugs while on prescription medications. Report any adverse effects from your medications to your primary care provider promptly.  In the event of recurrent symptoms or worsening symptoms, call 911, a crisis hotline, or go to the nearest emergency department for evaluation.    Derrill Center, NP 12/09/2021

## 2021-12-10 ENCOUNTER — Ambulatory Visit (HOSPITAL_COMMUNITY): Payer: 59

## 2021-12-11 ENCOUNTER — Telehealth (HOSPITAL_BASED_OUTPATIENT_CLINIC_OR_DEPARTMENT_OTHER): Payer: 59 | Admitting: Psychiatry

## 2021-12-11 ENCOUNTER — Other Ambulatory Visit: Payer: Self-pay

## 2021-12-11 ENCOUNTER — Encounter (HOSPITAL_COMMUNITY): Payer: Self-pay | Admitting: Psychiatry

## 2021-12-11 DIAGNOSIS — F5101 Primary insomnia: Secondary | ICD-10-CM

## 2021-12-11 DIAGNOSIS — F3160 Bipolar disorder, current episode mixed, unspecified: Secondary | ICD-10-CM

## 2021-12-11 DIAGNOSIS — F410 Panic disorder [episodic paroxysmal anxiety] without agoraphobia: Secondary | ICD-10-CM

## 2021-12-11 MED ORDER — OLANZAPINE 20 MG PO TABS: 20.0000 mg | ORAL_TABLET | Freq: Every day | ORAL | 1 refills | Status: DC

## 2021-12-11 MED ORDER — TRAZODONE HCL 100 MG PO TABS: 100.0000 mg | ORAL_TABLET | Freq: Every day | ORAL | 1 refills | Status: DC

## 2021-12-11 MED ORDER — DIVALPROEX SODIUM ER 500 MG PO TB24: 500.0000 mg | ORAL_TABLET | Freq: Every day | ORAL | 1 refills | Status: DC

## 2021-12-11 MED ORDER — VORTIOXETINE HBR 10 MG PO TABS: 10.0000 mg | ORAL_TABLET | Freq: Every day | ORAL | 1 refills | Status: DC

## 2021-12-11 NOTE — Progress Notes (Signed)
Virtual Visit via Video Note ? ?I connected with Melissa Hayden on 12/11/21 at 11:00 AM EST by a video enabled telemedicine application and verified that I am speaking with the correct person using two identifiers. ? ?Location: ?Patient: Home ?Provider: Home Office ?  ?I discussed the limitations of evaluation and management by telemedicine and the availability of in person appointments. The patient expressed understanding and agreed to proceed. ? ?History of Present Illness: ?Patient is evaluated by video session.  She is a 46 year old Caucasian, employed divorced female who was seen first time 4 weeks ago.  Patient known to this Probation officer from the past and seen in March 2018.  She was noncompliant with the follow-ups but getting medication from PCP.  She was referred from PCP for management of her symptoms.  She was having racing thoughts, crying spells, lack of motivation to do things.  She struggled for her personal hygiene and reported her house was a mess.  She also started to have a lot of migraine headaches.  She lives by herself with her 3 dogs.  She had few friends but she was not socializing with them.  Her only close contact with her niece who lives in [place].  We have recommended IOP.  She liked the program.  She reported it helped the motivation to do things.  Her appetite is improved and she is sleeping better.  She noticed her sleep is much improved and now she started meditation.  She is not drinking or using any illegal substances.  Her anger, frustration, irritability is much better.  She has less panic attack and only taken 1 time Klonopin.  We started her on Depakote and that helped her mood irritability and migraine headaches.  She works in [employer] and ready to go back to work on Monday.  She has some anxiety going back to work.  She feels overwhelmed and not sure how she can deal with work.  She also hoping to reach out to her friends to start socializing.  She has not started individual  therapy but she was given number for [facility] for therapy sessions.  She denies any paranoia, hallucination, suicidal thoughts.  She denies any anger or any impulsive behavior.  Her energy level is improved. ? ?  ?Past Psychiatric History: ?H/O bipolar disorder and inpatient for suicidal attempt on Tylenol PM.  H/O another inpatient at [facility] in 2018 for suicidal attempt.  H/O IOP in 2023. H/O alcohol and using pain medication but claims to be sober for 10 years.  Tried Abilify, Lexapro, Seroquel, Cymbalta, Prozac, hydroxyzine and Celexa. Depakote work well which was given for migraine headaches. ? ?Psychiatric Specialty Exam: ?Physical Exam  ?Review of Systems  ?There were no vitals taken for this visit.There is no height or weight on file to calculate BMI.  ?General Appearance: Casual  ?Eye Contact:  Fair  ?Speech:  Slow  ?Volume:  Decreased  ?Mood:  Anxious and Dysphoric  ?Affect:  Congruent  ?Thought Process:  Goal Directed  ?Orientation:  Full (Time, Place, and Person)  ?Thought Content:  Rumination  ?Suicidal Thoughts:  No  ?Homicidal Thoughts:  No  ?Memory:  Immediate;   Good ?Recent;   Good ?Remote;   Good  ?Judgement:  Intact  ?Insight:  Present  ?Psychomotor Activity:  Normal  ?Concentration:  Concentration: Good and Attention Span: Good  ?Recall:  Good  ?Fund of Knowledge:  Good  ?Language:  Good  ?Akathisia:  No  ?Handed:  Right  ?  AIMS (if indicated):     ?Assets:  Communication Skills ?Desire for Improvement ?Housing ?Talents/Skills ?Transportation  ?ADL's:  Intact  ?Cognition:  WNL  ?Sleep:   better  ? ? ? ? ?Assessment and Plan: ?Bipolar disorder, mixed type.  Primary insomnia.  Panic attacks. ? ?I reviewed notes from IOP and review her medication.  She is taking multiple medication which are olanzapine, Depakote, Klonopin, trazodone and Trintellix.  Most of her medications are given by PCP but she like to follow up with a psychiatrist.  We talk about going back to work on  Monday.  I recommend she should discuss with her human resources if they allow reduce hour or part-time work before she go to full-time.  She agreed to give them a call and if they need any paperwork then she will drop paperwork at our office.  I prefer her to go half a day for 2 to 3 weeks until she go full-time.  We also reviewed her medication and she like to get refills from our office.  She has given Klonopin and trazodone enough refills and she does not need a new prescription at this time.  However she will require Trintellix 10 mg daily, Depakote 500 mg at bedtime, olanzapine 20 mg at bedtime.  She is taking trazodone 150 however I recommended if she is sleeping better with the Depakote then she can reduce the dose to only 50-100 mg.  We also talked about reducing the Klonopin to take only as needed.  So far she has taken 1 tablet since the last visit.  Encouraged to call at [facility] at [phone] to schedule appointment with therapy.  Discuss safety concerns and anytime having active suicidal thoughts or homicidal thought that she need to call 911 or go to local emergency room.  We will follow up in 2 months. ? ?Follow Up Instructions: ? ?  ?I discussed the assessment and treatment plan with the patient. The patient was provided an opportunity to ask questions and all were answered. The patient agreed with the plan and demonstrated an understanding of the instructions. ?  ?The patient was advised to call back or seek an in-person evaluation if the symptoms worsen or if the condition fails to improve as anticipated. ? ?I provided 35 minutes of non-face-to-face time during this encounter. ? ? ?Kathlee Nations, MD  ?

## 2021-12-14 ENCOUNTER — Ambulatory Visit (HOSPITAL_COMMUNITY): Payer: 59

## 2021-12-15 ENCOUNTER — Other Ambulatory Visit (HOSPITAL_COMMUNITY): Payer: Self-pay | Admitting: Registered Nurse

## 2021-12-15 ENCOUNTER — Ambulatory Visit (HOSPITAL_COMMUNITY): Payer: 59

## 2021-12-15 DIAGNOSIS — F3160 Bipolar disorder, current episode mixed, unspecified: Secondary | ICD-10-CM

## 2021-12-15 DIAGNOSIS — F5101 Primary insomnia: Secondary | ICD-10-CM

## 2021-12-16 ENCOUNTER — Ambulatory Visit (HOSPITAL_COMMUNITY): Payer: 59

## 2021-12-17 ENCOUNTER — Ambulatory Visit (HOSPITAL_COMMUNITY): Payer: 59

## 2021-12-18 ENCOUNTER — Ambulatory Visit (HOSPITAL_COMMUNITY): Payer: 59

## 2021-12-29 ENCOUNTER — Other Ambulatory Visit: Payer: Self-pay | Admitting: Registered Nurse

## 2021-12-29 DIAGNOSIS — F3160 Bipolar disorder, current episode mixed, unspecified: Secondary | ICD-10-CM

## 2021-12-29 MED ORDER — CLONAZEPAM 0.5 MG PO TBDP: 0.5000 mg | ORAL_TABLET | Freq: Three times a day (TID) | ORAL | 0 refills | Status: DC | PRN

## 2022-01-03 ENCOUNTER — Other Ambulatory Visit (HOSPITAL_COMMUNITY): Payer: Self-pay | Admitting: Psychiatry

## 2022-01-03 DIAGNOSIS — F3160 Bipolar disorder, current episode mixed, unspecified: Secondary | ICD-10-CM

## 2022-01-05 ENCOUNTER — Other Ambulatory Visit: Payer: Self-pay | Admitting: Registered Nurse

## 2022-01-05 ENCOUNTER — Other Ambulatory Visit (HOSPITAL_COMMUNITY): Payer: Self-pay | Admitting: Psychiatry

## 2022-01-05 DIAGNOSIS — F3160 Bipolar disorder, current episode mixed, unspecified: Secondary | ICD-10-CM

## 2022-01-05 DIAGNOSIS — G43709 Chronic migraine without aura, not intractable, without status migrainosus: Secondary | ICD-10-CM

## 2022-02-10 ENCOUNTER — Telehealth (HOSPITAL_COMMUNITY): Payer: 59 | Admitting: Psychiatry

## 2022-03-13 ENCOUNTER — Other Ambulatory Visit: Payer: Self-pay | Admitting: Registered Nurse

## 2022-03-13 DIAGNOSIS — Z7901 Long term (current) use of anticoagulants: Secondary | ICD-10-CM

## 2022-03-19 ENCOUNTER — Telehealth (HOSPITAL_COMMUNITY): Payer: 59 | Admitting: Psychiatry

## 2022-04-01 ENCOUNTER — Other Ambulatory Visit: Payer: Self-pay | Admitting: Registered Nurse

## 2022-04-01 DIAGNOSIS — F3160 Bipolar disorder, current episode mixed, unspecified: Secondary | ICD-10-CM

## 2022-04-12 ENCOUNTER — Telehealth: Payer: Self-pay | Admitting: Registered Nurse

## 2022-04-12 ENCOUNTER — Other Ambulatory Visit: Payer: Self-pay

## 2022-04-12 ENCOUNTER — Other Ambulatory Visit (HOSPITAL_COMMUNITY): Payer: Self-pay | Admitting: *Deleted

## 2022-04-12 DIAGNOSIS — Z7901 Long term (current) use of anticoagulants: Secondary | ICD-10-CM

## 2022-04-12 DIAGNOSIS — F3160 Bipolar disorder, current episode mixed, unspecified: Secondary | ICD-10-CM

## 2022-04-12 DIAGNOSIS — F5101 Primary insomnia: Secondary | ICD-10-CM

## 2022-04-12 DIAGNOSIS — F410 Panic disorder [episodic paroxysmal anxiety] without agoraphobia: Secondary | ICD-10-CM

## 2022-04-12 MED ORDER — OLANZAPINE 20 MG PO TABS: ORAL_TABLET | ORAL | 1 refills | Status: DC

## 2022-04-12 MED ORDER — TRAZODONE HCL 100 MG PO TABS: 100.0000 mg | ORAL_TABLET | Freq: Every day | ORAL | 1 refills | Status: DC

## 2022-04-12 MED ORDER — DIVALPROEX SODIUM ER 500 MG PO TB24: 500.0000 mg | ORAL_TABLET | Freq: Every day | ORAL | 0 refills | Status: DC

## 2022-04-12 MED ORDER — VORTIOXETINE HBR 10 MG PO TABS: 10.0000 mg | ORAL_TABLET | Freq: Every day | ORAL | 0 refills | Status: DC

## 2022-04-12 MED ORDER — WARFARIN SODIUM 5 MG PO TABS: ORAL_TABLET | ORAL | 1 refills | Status: DC

## 2022-04-12 NOTE — Telephone Encounter (Signed)
Pt called stating that she needs Rx's for warfarin 5 mg, trazadone 100 mg and olanzapine 20 mg sent to [facility] in [place]. They were too expensive at other pharmacy.

## 2022-04-12 NOTE — Telephone Encounter (Signed)
Sent to the pharmacy as requested

## 2022-04-15 ENCOUNTER — Telehealth: Payer: Self-pay | Admitting: Registered Nurse

## 2022-04-15 NOTE — Telephone Encounter (Signed)
Spoke with pt; advised that she would need a medication check apptmt before clonazepam 0.5 mg can be refilled.  Pt stated that she understood and will call back to schedule and apptmt at a later date.

## 2022-04-15 NOTE — Telephone Encounter (Signed)
Encourage patient to contact the pharmacy for refills or they can request refills through [facility]  (Please schedule appointment if patient has not been seen in over a year)    WHAT [place] TO: Alexia Freestone. [phone]   MEDICATION NAME & DOSE: clonazepam 0.5 mg   NOTES/COMMENTS FROM PATIENT: none      Front office please notify patient: It takes 48-72 hours to process rx refill requests Ask patient to call pharmacy to ensure rx is ready before heading there.

## 2022-04-16 NOTE — Telephone Encounter (Signed)
Perfect -   Thanks,  Denice Paradise

## 2022-04-21 ENCOUNTER — Encounter (HOSPITAL_COMMUNITY): Payer: Self-pay | Admitting: Psychiatry

## 2022-04-21 ENCOUNTER — Telehealth (HOSPITAL_BASED_OUTPATIENT_CLINIC_OR_DEPARTMENT_OTHER): Payer: Self-pay | Admitting: Psychiatry

## 2022-04-21 VITALS — Wt 152.0 lb

## 2022-04-21 DIAGNOSIS — F5101 Primary insomnia: Secondary | ICD-10-CM

## 2022-04-21 DIAGNOSIS — F3160 Bipolar disorder, current episode mixed, unspecified: Secondary | ICD-10-CM

## 2022-04-21 DIAGNOSIS — F419 Anxiety disorder, unspecified: Secondary | ICD-10-CM

## 2022-04-21 MED ORDER — CITALOPRAM HYDROBROMIDE 20 MG PO TABS: ORAL_TABLET | ORAL | 0 refills | Status: DC

## 2022-04-21 MED ORDER — DIVALPROEX SODIUM ER 250 MG PO TB24: 750.0000 mg | ORAL_TABLET | Freq: Every day | ORAL | 0 refills | Status: DC

## 2022-04-21 NOTE — Progress Notes (Signed)
Virtual Visit via Video Note  I connected with Melissa Hayden on 04/21/22 at  4:00 PM EDT by a video enabled telemedicine application and verified that I am speaking with the correct person using two identifiers.  Location: Patient: Home Provider: Home Office   I discussed the limitations of evaluation and management by telemedicine and the availability of in person appointments. The patient expressed understanding and agreed to proceed.  History of Present Illness: Patient is evaluated by video session.  She endorsed a lot of anxiety, nervousness and extreme sadness because she fired from her new job.  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  She is not having issues with her own insurance.  She admitted poor sleep, mental fatigue, extremely tired and no motivation to do things.  She also cannot afford Trintellix even though it was working but wants to go back on Celexa which she had taken in the past with good response.  We started her on Depakote that helped her mood swings, irritability, mania.  She denies any hallucination or any paranoia.  She denies any suicidal thoughts.  Her appetite is fair.  She lives with her 3 dogs.  She has limited friends.  She has not able to contact [facility] but like to check with them so she can resume therapy.  She endorsed irritability frustration.  She has not taken Klonopin in a while.  She is getting olanzapine and trazodone from her PCP.  Past Psychiatric History: H/O bipolar disorder and inpatient for suicidal attempt on Tylenol PM.  H/O another inpatient at [facility] in 2018 for suicidal attempt.  H/O IOP in 2023. H/O alcohol and using pain medication but claims to be sober for 10 years.  Tried Abilify, Lexapro, Seroquel, Cymbalta, Prozac, hydroxyzine and Celexa. Depakote work well which was given for migraine headaches.   Psychiatric Specialty  Exam: Physical Exam  Review of Systems  Weight 152 lb (68.9 kg).There is no height or weight on file to calculate BMI.  General Appearance: Casual  Eye Contact:  Fair  Speech:  Pressured  Volume:  Increased  Mood:  Anxious and Depressed  Affect:  Labile  Thought Process:  Descriptions of Associations: Intact  Orientation:  Full (Time, Place, and Person)  Thought Content:  Rumination  Suicidal Thoughts:  No  Homicidal Thoughts:  No  Memory:  Immediate;   Fair Recent;   Fair Remote;   Fair  Judgement:  Fair  Insight:  Shallow  Psychomotor Activity:  Decreased  Concentration:  Concentration: Fair and Attention Span: Fair  Recall:  Good  Fund of Knowledge:  Good  Language:  Good  Akathisia:  No  Handed:  Right  AIMS (if indicated):     Assets:  Communication Skills Desire for Improvement Housing  ADL's:  Intact  Cognition:  WNL  Sleep:   too little or too much     Assessment and Plan: Bipolar disorder, mixed type.  Primary insomnia.  Panic attacks with  I reviewed current medication.  Patient is not taking Klonopin and cannot afford Trintellix.  Recommend to go back on Celexa which she used to take in the past.  We will start 10 mg for 1 week and then 20 mg daily.  I will also increase Depakote from '500mg'$  to 750 mg at bedtime.  Continue trazodone 150 mg and olanzapine 20 mg prescribed by her PCP.  In  the future she would like to keep the psychotropic medication from our office.  I also encourage contact [facility] so she can resume therapy.  Discussed safety concerns at any time having active suicidal thoughts or homicidal thought that she need to call 911 or go to local emergency room.  Follow-up in 4 weeks.  We will do Depakote level on her next appointment.  Follow Up Instructions:    I discussed the assessment and treatment plan with the patient. The patient was provided an opportunity to ask questions and all were answered. The patient agreed with the plan and  demonstrated an understanding of the instructions.   The patient was advised to call back or seek an in-person evaluation if the symptoms worsen or if the condition fails to improve as anticipated. Collaboration of Care: Primary Care Provider AEB notes are available in epic to review  Patient/Guardian was advised Release of Information must be obtained prior to any record release in order to collaborate their care with an outside provider. Patient/Guardian was advised if they have not already done so to contact the registration department to sign all necessary forms in order for [name] to release information regarding their care.   Consent: Patient/Guardian gives verbal consent for treatment and assignment of benefits for services provided during this visit. Patient/Guardian expressed understanding and agreed to proceed.    I provided 25 minutes of non-face-to-face time during this encounter.   Kathlee Nations, MD

## 2022-05-05 ENCOUNTER — Other Ambulatory Visit (HOSPITAL_COMMUNITY): Payer: Self-pay | Admitting: Psychiatry

## 2022-05-05 DIAGNOSIS — F3160 Bipolar disorder, current episode mixed, unspecified: Secondary | ICD-10-CM

## 2022-05-10 ENCOUNTER — Other Ambulatory Visit (HOSPITAL_COMMUNITY): Payer: Self-pay | Admitting: *Deleted

## 2022-05-10 DIAGNOSIS — F3160 Bipolar disorder, current episode mixed, unspecified: Secondary | ICD-10-CM

## 2022-05-10 DIAGNOSIS — F419 Anxiety disorder, unspecified: Secondary | ICD-10-CM

## 2022-05-10 MED ORDER — CITALOPRAM HYDROBROMIDE 20 MG PO TABS: ORAL_TABLET | ORAL | 0 refills | Status: DC

## 2022-05-10 MED ORDER — DIVALPROEX SODIUM ER 250 MG PO TB24: 750.0000 mg | ORAL_TABLET | Freq: Every day | ORAL | 0 refills | Status: DC

## 2022-05-20 ENCOUNTER — Encounter (HOSPITAL_COMMUNITY): Payer: Self-pay | Admitting: Psychiatry

## 2022-05-20 ENCOUNTER — Telehealth (HOSPITAL_BASED_OUTPATIENT_CLINIC_OR_DEPARTMENT_OTHER): Payer: Self-pay | Admitting: Psychiatry

## 2022-05-20 DIAGNOSIS — F3160 Bipolar disorder, current episode mixed, unspecified: Secondary | ICD-10-CM

## 2022-05-20 DIAGNOSIS — F5101 Primary insomnia: Secondary | ICD-10-CM

## 2022-05-20 DIAGNOSIS — F419 Anxiety disorder, unspecified: Secondary | ICD-10-CM

## 2022-05-20 MED ORDER — TRAZODONE HCL 100 MG PO TABS: 100.0000 mg | ORAL_TABLET | Freq: Every day | ORAL | 1 refills | Status: DC

## 2022-05-20 MED ORDER — CITALOPRAM HYDROBROMIDE 20 MG PO TABS: ORAL_TABLET | ORAL | 0 refills | Status: DC

## 2022-05-20 MED ORDER — OLANZAPINE 20 MG PO TABS: ORAL_TABLET | ORAL | 1 refills | Status: DC

## 2022-05-20 MED ORDER — DIVALPROEX SODIUM ER 500 MG PO TB24: 1000.0000 mg | ORAL_TABLET | Freq: Every day | ORAL | 1 refills | Status: DC

## 2022-05-20 NOTE — [facility] Notes (Signed)
Virtual Visit via Video Note  I connected with Melissa Hayden on 05/20/22 at 10:40 AM EDT by a video enabled telemedicine application and verified that I am speaking with the correct person using two identifiers.  Location: Patient: Home Provider: Home Office   I discussed the limitations of evaluation and management by telemedicine and the availability of in person appointments. The patient expressed understanding and agreed to proceed.  History of Present Illness: Patient is evaluated by video session.  On the last visit we increased Depakote and try Celexa because she could not afford Trintellix.  She is also not taking Klonopin for the same reason.  However she is taking olanzapine and trazodone.  She noticed improvement in her mood irritability, crying spells and depression.  Her sleep is better.  However she is still have mood lability, racing thoughts, frustration and getting easily overwhelmed.  She does go outside with her dogs.  She talked to her niece on a regular basis who lives in [place].  She denies any suicidal thoughts but has ruminative and negative thoughts.  She denies any hallucination, paranoia or any anger.  She is looking for a job however so far has not have any leads.  She reported having 2 major anxiety attack which lasted for 10 minutes but able to calm down after distracting herself.  She is not sure what triggered these attacks.  She also saw emergency room last week because of throat pain.  Her WBC was 11.3, glucose 114, creatinine 0.80.  Her platelets were normal.  She has no tremors, shakes or any EPS.  She denies any nausea, GI side effects from Depakote.  Past Psychiatric History: H/O bipolar disorder and inpatient for suicidal attempt on Tylenol PM.  H/O another inpatient at [facility] in 2018 for suicidal attempt.  H/O IOP in 2023. H/O alcohol and using pain medication but claims to be sober for 10 years.  Tried Abilify, Lexapro, Seroquel, Cymbalta, Prozac,  hydroxyzine and Celexa. Depakote work well which was given for migraine headaches.    Psychiatric Specialty Exam: Physical Exam  Review of Systems  Weight 156 lb (70.8 kg).Body mass index is 20.03 kg/m.  General Appearance: Casual  Eye Contact:  Fair  Speech:  Clear and Coherent  Volume:  Normal  Mood:  Anxious and Dysphoric  Affect:  Congruent  Thought Process:  Descriptions of Associations: Intact  Orientation:  Full (Time, Place, and Person)  Thought Content:  Rumination  Suicidal Thoughts:  No  Homicidal Thoughts:  No  Memory:  Immediate;   Fair Recent;   Good Remote;   Good  Judgement:  Intact  Insight:  Present  Psychomotor Activity:  Normal  Concentration:  Concentration: Good and Attention Span: Good  Recall:  Good  Fund of Knowledge:  Good  Language:  Good  Akathisia:  No  Handed:  Right  AIMS (if indicated):     Assets:  Communication Skills Desire for Improvement Housing Transportation  ADL's:  Intact  Cognition:  WNL  Sleep:   better      Assessment and Plan: Bipolar disorder, mixed type.  Primary insomnia.  Panic attacks.  I review blood work results, current medication.  She is taking Depakote 750 but is still have mood lability, residual frustration, racing thoughts and ruminative thoughts.  So far she is tolerating medication and her platelets are normal along with liver function test.  I recommend to try Depakote 1000 mg at bedtime to help her residual mood lability.  Continue trazodone 150 mg at bedtime, olanzapine 20 mg at bedtime and Celexa 20 mg daily.  We talk about seeing a therapist and patient agree to see a therapist as not able to connect with her previous therapist.  We will refer her to see Margreta Journey in our office.  Discuss safety concerns at any time having active suicidal thoughts or homicidal thought that she need to call 911 or go to local emergency room.  Patient prefers [facility] because she can afford the medication there.  She  is hoping to find a job soon as she is looking actively.  We will provide a 90-day supply to all her medication except Depakote because we increased the dose to 1000 mg at bedtime.  Recommended to call us back if she has any concerns about the medication.  Follow up in 2 months.  We will do Depakote level on her next appointment.  Follow Up Instructions:    I discussed the assessment and treatment plan with the patient. The patient was provided an opportunity to ask questions and all were answered. The patient agreed with the plan and demonstrated an understanding of the instructions.   The patient was advised to call back or seek an in-person evaluation if the symptoms worsen or if the condition fails to improve as anticipated.  Collaboration of Care: Other provider involved in patient's care AEB notes are available in epic to review.  Patient/Guardian was advised Release of Information must be obtained prior to any record release in order to collaborate their care with an outside provider. Patient/Guardian was advised if they have not already done so to contact the registration department to sign all necessary forms in order for [name] to release information regarding their care.   Consent: Patient/Guardian gives verbal consent for treatment and assignment of benefits for services provided during this visit. Patient/Guardian expressed understanding and agreed to proceed.    I provided 24 minutes of non-face-to-face time during this encounter.   Kathlee Nations, MD

## 2022-07-20 ENCOUNTER — Telehealth (HOSPITAL_BASED_OUTPATIENT_CLINIC_OR_DEPARTMENT_OTHER): Payer: Self-pay | Admitting: Psychiatry

## 2022-07-20 ENCOUNTER — Encounter (HOSPITAL_COMMUNITY): Payer: Self-pay | Admitting: Psychiatry

## 2022-07-20 DIAGNOSIS — F419 Anxiety disorder, unspecified: Secondary | ICD-10-CM

## 2022-07-20 DIAGNOSIS — F3160 Bipolar disorder, current episode mixed, unspecified: Secondary | ICD-10-CM

## 2022-07-20 DIAGNOSIS — F5101 Primary insomnia: Secondary | ICD-10-CM

## 2022-07-20 MED ORDER — TRAZODONE HCL 150 MG PO TABS: 150.0000 mg | ORAL_TABLET | Freq: Every day | ORAL | 0 refills | Status: DC

## 2022-07-20 MED ORDER — CITALOPRAM HYDROBROMIDE 20 MG PO TABS: ORAL_TABLET | ORAL | 0 refills | Status: DC

## 2022-07-20 MED ORDER — OLANZAPINE 20 MG PO TABS: ORAL_TABLET | ORAL | 0 refills | Status: DC

## 2022-07-20 MED ORDER — DIVALPROEX SODIUM ER 500 MG PO TB24: 1000.0000 mg | ORAL_TABLET | Freq: Every day | ORAL | 0 refills | Status: DC

## 2022-07-20 NOTE — [facility] Notes (Signed)
Virtual Visit via Video Note  I connected with Melissa Hayden on 07/20/22 at 10:40 AM EDT by a video enabled telemedicine application and verified that I am speaking with the correct person using two identifiers.  Location: Patient: Home Provider: Office   I discussed the limitations of evaluation and management by telemedicine and the availability of in person appointments. The patient expressed understanding and agreed to proceed.  History of Present Illness: Patient is evaluated by video session.  She had trouble getting her prescription filled because of finances.  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  She is hoping to get her medication on time.  She actually had not tried Depakote 1000 mg because she could not afford and is still struggle some time for sleep but overall her sleep is better.  Her niece is pregnant and she is happy about it.  She denies any mania, agitation, suicidal thoughts or homicidal thoughts.  She has few panic attacks but overall she feels her mood is much better and is stable.  She could not find a therapist but hoping once she had a job she will able to start therapy.  She has no tremors, shakes or any EPS.  She like to keep the current medication.  Past Psychiatric History: H/O bipolar disorder and inpatient for suicidal attempt on Tylenol PM.  H/O another inpatient at [facility] in 2018 for suicidal attempt.  H/O IOP in 2023. H/O alcohol and using pain medication but claims to be sober for 10 years.  Tried Abilify, Lexapro, Seroquel, Cymbalta, Prozac, hydroxyzine and Celexa. Depakote work well which was given for migraine headaches.     Psychiatric Specialty Exam: Physical Exam  Review of Systems  Weight 156 lb (70.8 kg).There is no height or weight on file to calculate BMI.  General Appearance: Casual  Eye Contact:  Good  Speech:  Clear and Coherent  Volume:  Normal  Mood:  Anxious  Affect:  Appropriate   Thought Process:  Goal Directed  Orientation:  Full (Time, Place, and Person)  Thought Content:  Rumination  Suicidal Thoughts:  No  Homicidal Thoughts:  No  Memory:  Immediate;   Good Recent;   Good Remote;   Fair  Judgement:  Intact  Insight:  Present  Psychomotor Activity:  Normal  Concentration:  Concentration: Fair and Attention Span: Fair  Recall:  Good  Fund of Knowledge:  Good  Language:  Good  Akathisia:  No  Handed:  Right  AIMS (if indicated):     Assets:  Communication Skills Desire for Improvement Housing Resilience Transportation  ADL's:  Intact  Cognition:  WNL  Sleep:   better      Assessment and Plan: Bipolar disorder, mixed type.  Primary insomnia.  Panic attacks.  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  She is going to pick up her medication today and try Depakote 1000 mg at bedtime.  Overall things are going well.  Continue olanzapine 20 mg at bedtime, trazodone 150 mg at bedtime, Celexa 20 mg daily.  I recommend call us back if she has any issues getting Depakote 1000 mg for the side effects.  Recommend follow-up in 3 months.  We will do Depakote level on her next appointment.  She will also wait for therapy once she starts to work.  Follow Up Instructions:  I discussed the assessment and treatment plan with the patient. The patient was provided an opportunity to ask questions and all were answered. The patient agreed with the plan and demonstrated an understanding of the instructions.   The patient was advised to call back or seek an in-person evaluation if the symptoms worsen or if the condition fails to improve as anticipated.  I provided 15 minutes of non-face-to-face time during this encounter.   Kathlee Nations, MD

## 2022-10-19 ENCOUNTER — Telehealth (HOSPITAL_COMMUNITY): Payer: Self-pay | Admitting: Psychiatry

## 2023-03-17 ENCOUNTER — Emergency Department (HOSPITAL_COMMUNITY): Payer: Medicaid Other

## 2023-03-17 ENCOUNTER — Other Ambulatory Visit: Payer: Self-pay

## 2023-03-17 ENCOUNTER — Inpatient Hospital Stay (HOSPITAL_COMMUNITY)
Admission: EM | Admit: 2023-03-17 | Discharge: 2023-03-25 | DRG: 071 | Discharge status: Against Medical Advice | Payer: Medicaid Other | Attending: Internal Medicine | Admitting: Internal Medicine

## 2023-03-17 DIAGNOSIS — Z5329 Procedure and treatment not carried out because of patient's decision for other reasons: Secondary | ICD-10-CM | POA: Diagnosis present

## 2023-03-17 DIAGNOSIS — G9341 Metabolic encephalopathy: Principal | ICD-10-CM | POA: Diagnosis present

## 2023-03-17 DIAGNOSIS — Z952 Presence of prosthetic heart valve: Secondary | ICD-10-CM

## 2023-03-17 DIAGNOSIS — E8809 Other disorders of plasma-protein metabolism, not elsewhere classified: Secondary | ICD-10-CM | POA: Diagnosis not present

## 2023-03-17 DIAGNOSIS — I719 Aortic aneurysm of unspecified site, without rupture: Secondary | ICD-10-CM | POA: Diagnosis not present

## 2023-03-17 DIAGNOSIS — Q8740 Marfan's syndrome, unspecified: Secondary | ICD-10-CM

## 2023-03-17 DIAGNOSIS — I952 Hypotension due to drugs: Secondary | ICD-10-CM

## 2023-03-17 DIAGNOSIS — I5022 Chronic systolic (congestive) heart failure: Secondary | ICD-10-CM | POA: Diagnosis not present

## 2023-03-17 DIAGNOSIS — F3160 Bipolar disorder, current episode mixed, unspecified: Secondary | ICD-10-CM | POA: Diagnosis not present

## 2023-03-17 DIAGNOSIS — Z8249 Family history of ischemic heart disease and other diseases of the circulatory system: Secondary | ICD-10-CM

## 2023-03-17 DIAGNOSIS — I509 Heart failure, unspecified: Secondary | ICD-10-CM | POA: Diagnosis not present

## 2023-03-17 DIAGNOSIS — Z8279 Family history of other congenital malformations, deformations and chromosomal abnormalities: Secondary | ICD-10-CM

## 2023-03-17 DIAGNOSIS — E860 Dehydration: Secondary | ICD-10-CM | POA: Diagnosis present

## 2023-03-17 DIAGNOSIS — T426X5A Adverse effect of other antiepileptic and sedative-hypnotic drugs, initial encounter: Secondary | ICD-10-CM | POA: Diagnosis present

## 2023-03-17 DIAGNOSIS — Z9151 Personal history of suicidal behavior: Secondary | ICD-10-CM

## 2023-03-17 DIAGNOSIS — R471 Dysarthria and anarthria: Secondary | ICD-10-CM | POA: Diagnosis present

## 2023-03-17 DIAGNOSIS — Z5986 Financial insecurity: Secondary | ICD-10-CM

## 2023-03-17 DIAGNOSIS — I951 Orthostatic hypotension: Secondary | ICD-10-CM | POA: Diagnosis present

## 2023-03-17 DIAGNOSIS — L89152 Pressure ulcer of sacral region, stage 2: Secondary | ICD-10-CM | POA: Diagnosis not present

## 2023-03-17 DIAGNOSIS — N280 Ischemia and infarction of kidney: Secondary | ICD-10-CM | POA: Diagnosis present

## 2023-03-17 DIAGNOSIS — Z7901 Long term (current) use of anticoagulants: Secondary | ICD-10-CM | POA: Diagnosis not present

## 2023-03-17 DIAGNOSIS — R946 Abnormal results of thyroid function studies: Secondary | ICD-10-CM | POA: Diagnosis present

## 2023-03-17 DIAGNOSIS — E872 Acidosis, unspecified: Secondary | ICD-10-CM | POA: Diagnosis present

## 2023-03-17 DIAGNOSIS — D649 Anemia, unspecified: Secondary | ICD-10-CM | POA: Diagnosis not present

## 2023-03-17 DIAGNOSIS — F5101 Primary insomnia: Secondary | ICD-10-CM

## 2023-03-17 DIAGNOSIS — I428 Other cardiomyopathies: Secondary | ICD-10-CM | POA: Diagnosis present

## 2023-03-17 DIAGNOSIS — I671 Cerebral aneurysm, nonruptured: Secondary | ICD-10-CM | POA: Diagnosis present

## 2023-03-17 DIAGNOSIS — Z95810 Presence of automatic (implantable) cardiac defibrillator: Secondary | ICD-10-CM | POA: Diagnosis not present

## 2023-03-17 DIAGNOSIS — G9340 Encephalopathy, unspecified: Principal | ICD-10-CM

## 2023-03-17 DIAGNOSIS — G43709 Chronic migraine without aura, not intractable, without status migrainosus: Secondary | ICD-10-CM

## 2023-03-17 DIAGNOSIS — Z1152 Encounter for screening for COVID-19: Secondary | ICD-10-CM | POA: Diagnosis not present

## 2023-03-17 DIAGNOSIS — E162 Hypoglycemia, unspecified: Secondary | ICD-10-CM | POA: Diagnosis present

## 2023-03-17 DIAGNOSIS — R55 Syncope and collapse: Secondary | ICD-10-CM | POA: Diagnosis not present

## 2023-03-17 DIAGNOSIS — Z91141 Patient's other noncompliance with medication regimen due to financial hardship: Secondary | ICD-10-CM

## 2023-03-17 DIAGNOSIS — F17210 Nicotine dependence, cigarettes, uncomplicated: Secondary | ICD-10-CM | POA: Diagnosis present

## 2023-03-17 DIAGNOSIS — R0602 Shortness of breath: Secondary | ICD-10-CM | POA: Diagnosis present

## 2023-03-17 DIAGNOSIS — Z79899 Other long term (current) drug therapy: Secondary | ICD-10-CM | POA: Diagnosis not present

## 2023-03-17 DIAGNOSIS — E876 Hypokalemia: Secondary | ICD-10-CM | POA: Diagnosis not present

## 2023-03-17 DIAGNOSIS — Z888 Allergy status to other drugs, medicaments and biological substances status: Secondary | ICD-10-CM

## 2023-03-17 DIAGNOSIS — F419 Anxiety disorder, unspecified: Secondary | ICD-10-CM

## 2023-03-17 LAB — URINALYSIS, ROUTINE W REFLEX MICROSCOPIC
Bacteria, UA: NONE SEEN
Bilirubin Urine: NEGATIVE
Glucose, UA: NEGATIVE mg/dL
Ketones, ur: 20 mg/dL — AB
Leukocytes,Ua: NEGATIVE
Nitrite: NEGATIVE
Protein, ur: NEGATIVE mg/dL
Specific Gravity, Urine: 1.036 — ABNORMAL HIGH (ref 1.005–1.030)
pH: 5 (ref 5.0–8.0)

## 2023-03-17 LAB — GLUCOSE, CAPILLARY
Glucose-Capillary: 113 mg/dL — ABNORMAL HIGH (ref 70–99)
Glucose-Capillary: 95 mg/dL (ref 70–99)

## 2023-03-17 LAB — I-STAT ARTERIAL BLOOD GAS, ED
Acid-base deficit: 10 mmol/L — ABNORMAL HIGH (ref 0.0–2.0)
Bicarbonate: 12.6 mmol/L — ABNORMAL LOW (ref 20.0–28.0)
Calcium, Ion: 0.89 mmol/L — CL (ref 1.15–1.40)
HCT: 32 % — ABNORMAL LOW (ref 36.0–46.0)
Hemoglobin: 10.9 g/dL — ABNORMAL LOW (ref 12.0–15.0)
O2 Saturation: 99 %
Patient temperature: 98
Potassium: 3.5 mmol/L (ref 3.5–5.1)
Sodium: 141 mmol/L (ref 135–145)
TCO2: 13 mmol/L — ABNORMAL LOW (ref 22–32)
pCO2 arterial: 20.4 mmHg — ABNORMAL LOW (ref 32–48)
pH, Arterial: 7.398 (ref 7.35–7.45)
pO2, Arterial: 146 mmHg — ABNORMAL HIGH (ref 83–108)

## 2023-03-17 LAB — COMPREHENSIVE METABOLIC PANEL
ALT: 12 U/L (ref 0–44)
ALT: 13 U/L (ref 0–44)
ALT: 15 U/L (ref 0–44)
AST: 21 U/L (ref 15–41)
AST: 24 U/L (ref 15–41)
AST: 26 U/L (ref 15–41)
Albumin: 3.7 g/dL (ref 3.5–5.0)
Albumin: 3.4 g/dL — ABNORMAL LOW (ref 3.5–5.0)
Albumin: 3 g/dL — ABNORMAL LOW (ref 3.5–5.0)
Alkaline Phosphatase: 44 U/L (ref 38–126)
Alkaline Phosphatase: 39 U/L (ref 38–126)
Alkaline Phosphatase: 37 U/L — ABNORMAL LOW (ref 38–126)
Anion gap: 14 (ref 5–15)
Anion gap: 15 (ref 5–15)
Anion gap: 11 (ref 5–15)
BUN: 18 mg/dL (ref 6–20)
BUN: 17 mg/dL (ref 6–20)
BUN: 16 mg/dL (ref 6–20)
CO2: 13 mmol/L — ABNORMAL LOW (ref 22–32)
CO2: 13 mmol/L — ABNORMAL LOW (ref 22–32)
CO2: 16 mmol/L — ABNORMAL LOW (ref 22–32)
Calcium: 7.9 mg/dL — ABNORMAL LOW (ref 8.9–10.3)
Calcium: 7.6 mg/dL — ABNORMAL LOW (ref 8.9–10.3)
Calcium: 7.4 mg/dL — ABNORMAL LOW (ref 8.9–10.3)
Chloride: 111 mmol/L (ref 98–111)
Chloride: 113 mmol/L — ABNORMAL HIGH (ref 98–111)
Chloride: 114 mmol/L — ABNORMAL HIGH (ref 98–111)
Creatinine, Ser: 1.06 mg/dL — ABNORMAL HIGH (ref 0.44–1.00)
Creatinine, Ser: 1.14 mg/dL — ABNORMAL HIGH (ref 0.44–1.00)
Creatinine, Ser: 1.08 mg/dL — ABNORMAL HIGH (ref 0.44–1.00)
GFR, Estimated: >60 mL/min (ref >60)
GFR, Estimated: >60 mL/min (ref >60)
GFR, Estimated: >60 mL/min (ref >60)
Glucose, Bld: 101 mg/dL — ABNORMAL HIGH (ref 70–99)
Glucose, Bld: 158 mg/dL — ABNORMAL HIGH (ref 70–99)
Glucose, Bld: 126 mg/dL — ABNORMAL HIGH (ref 70–99)
Potassium: 3.5 mmol/L (ref 3.5–5.1)
Potassium: 3.5 mmol/L (ref 3.5–5.1)
Potassium: 4.2 mmol/L (ref 3.5–5.1)
Sodium: 138 mmol/L (ref 135–145)
Sodium: 141 mmol/L (ref 135–145)
Sodium: 141 mmol/L (ref 135–145)
Total Bilirubin: 0.3 mg/dL (ref 0.3–1.2)
Total Bilirubin: 0.5 mg/dL (ref 0.3–1.2)
Total Bilirubin: 0.6 mg/dL (ref 0.3–1.2)
Total Protein: 6.5 g/dL (ref 6.5–8.1)
Total Protein: 6.2 g/dL — ABNORMAL LOW (ref 6.5–8.1)
Total Protein: 5.8 g/dL — ABNORMAL LOW (ref 6.5–8.1)

## 2023-03-17 LAB — ECHOCARDIOGRAM COMPLETE
AV Mean grad: 23 mmHg
AV Peak grad: 30.9 mmHg
Ao pk vel: 2.78 m/s
Area-P 1/2: 2.46 cm2
Height: 74 in
S' Lateral: 3.5 cm
Weight: 2496 oz

## 2023-03-17 LAB — I-STAT CHEM 8, ED
BUN: 16 mg/dL (ref 6–20)
Calcium, Ion: 0.82 mmol/L — CL (ref 1.15–1.40)
Chloride: 124 mmol/L — ABNORMAL HIGH (ref 98–111)
Creatinine, Ser: 1 mg/dL (ref 0.44–1.00)
Glucose, Bld: 106 mg/dL — ABNORMAL HIGH (ref 70–99)
HCT: 33 % — ABNORMAL LOW (ref 36.0–46.0)
Hemoglobin: 11.2 g/dL — ABNORMAL LOW (ref 12.0–15.0)
Potassium: 3.6 mmol/L (ref 3.5–5.1)
Sodium: 143 mmol/L (ref 135–145)
TCO2: 16 mmol/L — ABNORMAL LOW (ref 22–32)

## 2023-03-17 LAB — DIFFERENTIAL
Abs Immature Granulocytes: 0.04 10*3/uL (ref 0.00–0.07)
Basophils Absolute: 0 10*3/uL (ref 0.0–0.1)
Basophils Relative: 0 %
Eosinophils Absolute: 0.1 10*3/uL (ref 0.0–0.5)
Eosinophils Relative: 1 %
Immature Granulocytes: 0 %
Lymphocytes Relative: 14 %
Lymphs Abs: 1.4 10*3/uL (ref 0.7–4.0)
Monocytes Absolute: 0.8 10*3/uL (ref 0.1–1.0)
Monocytes Relative: 9 %
Neutro Abs: 7.2 10*3/uL (ref 1.7–7.7)
Neutrophils Relative %: 76 %

## 2023-03-17 LAB — RAPID URINE DRUG SCREEN, HOSP PERFORMED
Amphetamines: NOT DETECTED
Barbiturates: NOT DETECTED
Benzodiazepines: NOT DETECTED
Cocaine: NOT DETECTED
Opiates: NOT DETECTED
Tetrahydrocannabinol: NOT DETECTED

## 2023-03-17 LAB — CBG MONITORING, ED
Glucose-Capillary: 121 mg/dL — ABNORMAL HIGH (ref 70–99)
Glucose-Capillary: 128 mg/dL — ABNORMAL HIGH (ref 70–99)

## 2023-03-17 LAB — CBC
HCT: 34.5 % — ABNORMAL LOW (ref 36.0–46.0)
Hemoglobin: 11.4 g/dL — ABNORMAL LOW (ref 12.0–15.0)
MCH: 29 pg (ref 26.0–34.0)
MCHC: 33 g/dL (ref 30.0–36.0)
MCV: 87.8 fL (ref 80.0–100.0)
Platelets: 212 10*3/uL (ref 150–400)
RBC: 3.93 MIL/uL (ref 3.87–5.11)
RDW: 16.2 % — ABNORMAL HIGH (ref 11.5–15.5)
WBC: 9.4 10*3/uL (ref 4.0–10.5)
nRBC: 0 % (ref 0.0–0.2)

## 2023-03-17 LAB — TROPONIN I (HIGH SENSITIVITY)
Troponin I (High Sensitivity): 15 ng/L (ref <18)
Troponin I (High Sensitivity): 15 ng/L (ref <18)

## 2023-03-17 LAB — OSMOLALITY: Osmolality: 308 mOsm/kg — ABNORMAL HIGH (ref 275–295)

## 2023-03-17 LAB — PROTIME-INR
INR: 1.2 (ref 0.8–1.2)
Prothrombin Time: 15.5 seconds — ABNORMAL HIGH (ref 11.4–15.2)

## 2023-03-17 LAB — PROCALCITONIN: Procalcitonin: <0.1 ng/mL

## 2023-03-17 LAB — ETHANOL: Alcohol, Ethyl (B): <10 mg/dL (ref <10)

## 2023-03-17 LAB — MRSA NEXT GEN BY PCR, NASAL: MRSA by PCR Next Gen: NOT DETECTED

## 2023-03-17 LAB — APTT: aPTT: 35 seconds (ref 24–36)

## 2023-03-17 LAB — VALPROIC ACID LEVEL: Valproic Acid Lvl: <10 ug/mL — ABNORMAL LOW (ref 50.0–100.0)

## 2023-03-17 LAB — HEPARIN LEVEL (UNFRACTIONATED): Heparin Unfractionated: 0.25 IU/mL — ABNORMAL LOW (ref 0.30–0.70)

## 2023-03-17 LAB — BRAIN NATRIURETIC PEPTIDE: B Natriuretic Peptide: 198.2 pg/mL — ABNORMAL HIGH (ref 0.0–100.0)

## 2023-03-17 LAB — I-STAT BETA HCG BLOOD, ED (MC, WL, AP ONLY): I-stat hCG, quantitative: <5 m[IU]/mL (ref <5)

## 2023-03-17 LAB — HIV ANTIBODY (ROUTINE TESTING W REFLEX): HIV Screen 4th Generation wRfx: NONREACTIVE

## 2023-03-17 LAB — LACTIC ACID, PLASMA
Lactic Acid, Venous: 0.8 mmol/L (ref 0.5–1.9)
Lactic Acid, Venous: 1.5 mmol/L (ref 0.5–1.9)

## 2023-03-17 LAB — BETA-HYDROXYBUTYRIC ACID: Beta-Hydroxybutyric Acid: 0.24 mmol/L (ref 0.05–0.27)

## 2023-03-17 MED ORDER — MIDAZOLAM HCL 2 MG/2ML IJ SOLN
1.0000 mg | Freq: Once | INTRAMUSCULAR | Status: AC
  Administered 2023-03-17: 1 mg via INTRAVENOUS
  Filled 2023-03-17: qty 2

## 2023-03-17 MED ORDER — LACTATED RINGERS IV BOLUS
1000.0000 mL | Freq: Once | INTRAVENOUS | Status: AC
  Administered 2023-03-17: 1000 mL via INTRAVENOUS

## 2023-03-17 MED ORDER — NICOTINE 14 MG/24HR TD PT24
14.0000 mg | MEDICATED_PATCH | Freq: Every day | TRANSDERMAL | Status: DC
  Administered 2023-03-17 – 2023-03-19 (×3): 14 mg via TRANSDERMAL
  Filled 2023-03-17 (×8): qty 1

## 2023-03-17 MED ORDER — SODIUM CHLORIDE 0.9 % IV SOLN
2.0000 g | Freq: Once | INTRAVENOUS | Status: AC
  Administered 2023-03-17: 2 g via INTRAVENOUS
  Filled 2023-03-17: qty 12.5

## 2023-03-17 MED ORDER — VANCOMYCIN HCL IN DEXTROSE 1-5 GM/200ML-% IV SOLN
1000.0000 mg | Freq: Once | INTRAVENOUS | Status: AC
  Administered 2023-03-17: 1000 mg via INTRAVENOUS
  Filled 2023-03-17: qty 200

## 2023-03-17 MED ORDER — LORAZEPAM 2 MG/ML IJ SOLN
2.0000 mg | Freq: Once | INTRAMUSCULAR | Status: DC
  Administered 2023-03-17: 2 mg via INTRAVENOUS
  Filled 2023-03-17: qty 1

## 2023-03-17 MED ORDER — DEXMEDETOMIDINE HCL IN NACL 400 MCG/100ML IV SOLN
0.0000 ug/kg/h | INTRAVENOUS | Status: DC
  Administered 2023-03-17: 0.4 ug/kg/h via INTRAVENOUS
  Administered 2023-03-17: 0.8 ug/kg/h via INTRAVENOUS
  Filled 2023-03-17 (×2): qty 100

## 2023-03-17 MED ORDER — LACTATED RINGERS IV SOLN: INTRAVENOUS | Status: DC

## 2023-03-17 MED ORDER — HEPARIN SODIUM (PORCINE) 1000 UNIT/ML IJ SOLN
3500.0000 [IU] | Freq: Once | INTRAMUSCULAR | Status: DC
  Filled 2023-03-17: qty 3.5

## 2023-03-17 MED ORDER — THIAMINE HCL 100 MG/ML IJ SOLN
100.0000 mg | Freq: Every day | INTRAMUSCULAR | Status: DC
  Administered 2023-03-18 – 2023-03-19 (×2): 100 mg via INTRAVENOUS
  Filled 2023-03-17 (×2): qty 2

## 2023-03-17 MED ORDER — FUROSEMIDE 10 MG/ML IJ SOLN
20.0000 mg | Freq: Once | INTRAMUSCULAR | Status: AC
  Administered 2023-03-17: 20 mg via INTRAVENOUS
  Filled 2023-03-17: qty 2

## 2023-03-17 MED ORDER — ORAL CARE MOUTH RINSE: 15.0000 mL | OROMUCOSAL | Status: DC | PRN

## 2023-03-17 MED ORDER — THIAMINE MONONITRATE 100 MG PO TABS
100.0000 mg | ORAL_TABLET | Freq: Every day | ORAL | Status: DC
  Filled 2023-03-17: qty 1

## 2023-03-17 MED ORDER — LORAZEPAM 2 MG/ML IJ SOLN: 2.0000 mg | INTRAMUSCULAR | Status: DC | PRN

## 2023-03-17 MED ORDER — ADULT MULTIVITAMIN W/MINERALS CH
1.0000 | ORAL_TABLET | Freq: Every day | ORAL | Status: DC
  Administered 2023-03-20 – 2023-03-25 (×6): 1 via ORAL
  Filled 2023-03-17 (×8): qty 1

## 2023-03-17 MED ORDER — FOLIC ACID 1 MG PO TABS
1.0000 mg | ORAL_TABLET | Freq: Every day | ORAL | Status: DC
  Filled 2023-03-17 (×2): qty 1

## 2023-03-17 MED ORDER — CHLORHEXIDINE GLUCONATE CLOTH 2 % EX PADS
6.0000 | MEDICATED_PAD | Freq: Every day | CUTANEOUS | Status: DC
  Administered 2023-03-17 – 2023-03-25 (×9): 6 via TOPICAL

## 2023-03-17 MED ORDER — ORAL CARE MOUTH RINSE
15.0000 mL | OROMUCOSAL | Status: DC
  Administered 2023-03-18 – 2023-03-25 (×20): 15 mL via OROMUCOSAL

## 2023-03-17 MED ORDER — HEPARIN (PORCINE) 25000 UT/250ML-% IV SOLN
1600.0000 [IU]/h | INTRAVENOUS | Status: DC
  Administered 2023-03-17: 1000 [IU]/h via INTRAVENOUS
  Administered 2023-03-18: 1500 [IU]/h via INTRAVENOUS
  Administered 2023-03-18: 1300 [IU]/h via INTRAVENOUS
  Administered 2023-03-19 – 2023-03-22 (×5): 1500 [IU]/h via INTRAVENOUS
  Administered 2023-03-23: 1600 [IU]/h via INTRAVENOUS
  Filled 2023-03-17 (×9): qty 250

## 2023-03-17 MED ORDER — IPRATROPIUM-ALBUTEROL 0.5-2.5 (3) MG/3ML IN SOLN
3.0000 mL | Freq: Once | RESPIRATORY_TRACT | Status: AC
  Administered 2023-03-17: 3 mL via RESPIRATORY_TRACT
  Filled 2023-03-17: qty 3

## 2023-03-17 MED ORDER — POTASSIUM CHLORIDE 10 MEQ/100ML IV SOLN
10.0000 meq | INTRAVENOUS | Status: AC
  Administered 2023-03-17 (×4): 10 meq via INTRAVENOUS
  Filled 2023-03-17 (×4): qty 100

## 2023-03-17 MED ORDER — NOREPINEPHRINE 4 MG/250ML-% IV SOLN
2.0000 ug/min | INTRAVENOUS | Status: DC
  Administered 2023-03-17: 1 ug/min via INTRAVENOUS

## 2023-03-17 MED ORDER — IOHEXOL 350 MG/ML SOLN
75.0000 mL | Freq: Once | INTRAVENOUS | Status: AC | PRN
  Administered 2023-03-17: 75 mL via INTRAVENOUS

## 2023-03-17 MED ORDER — LORAZEPAM 1 MG PO TABS: 1.0000 mg | ORAL_TABLET | ORAL | Status: DC | PRN

## 2023-03-17 MED ORDER — SODIUM CHLORIDE 0.9 % IV SOLN: 250.0000 mL | INTRAVENOUS | Status: DC

## 2023-03-17 MED ORDER — POLYETHYLENE GLYCOL 3350 17 G PO PACK: 17.0000 g | PACK | Freq: Every day | ORAL | Status: DC | PRN

## 2023-03-17 MED ORDER — LORAZEPAM 2 MG/ML IJ SOLN: 1.0000 mg | INTRAMUSCULAR | Status: DC | PRN

## 2023-03-17 MED ORDER — NOREPINEPHRINE 4 MG/250ML-% IV SOLN
INTRAVENOUS | Status: AC
  Filled 2023-03-17: qty 250

## 2023-03-17 MED ORDER — LORAZEPAM 2 MG/ML IJ SOLN
1.0000 mg | INTRAMUSCULAR | Status: DC | PRN
  Administered 2023-03-18: 1 mg via INTRAVENOUS
  Administered 2023-03-18: 2 mg via INTRAVENOUS
  Administered 2023-03-18: 1 mg via INTRAVENOUS
  Administered 2023-03-18 – 2023-03-19 (×3): 2 mg via INTRAVENOUS
  Filled 2023-03-17 (×6): qty 1

## 2023-03-17 MED ORDER — DOCUSATE SODIUM 100 MG PO CAPS: 100.0000 mg | ORAL_CAPSULE | Freq: Two times a day (BID) | ORAL | Status: DC | PRN

## 2023-03-17 MED ORDER — HEPARIN BOLUS VIA INFUSION
3500.0000 [IU] | Freq: Once | INTRAVENOUS | Status: AC
  Administered 2023-03-17: 3500 [IU] via INTRAVENOUS
  Filled 2023-03-17: qty 3500

## 2023-03-17 MED ORDER — LORAZEPAM 2 MG/ML IJ SOLN
1.0000 mg | INTRAMUSCULAR | Status: DC | PRN
  Administered 2023-03-17: 1 mg via INTRAVENOUS
  Filled 2023-03-17: qty 1

## 2023-03-17 MED ORDER — SODIUM CHLORIDE 0.9 % IV SOLN
2.0000 g | INTRAVENOUS | Status: DC
  Administered 2023-03-17 – 2023-03-18 (×2): 2 g via INTRAVENOUS
  Filled 2023-03-17 (×2): qty 20

## 2023-03-17 MED ORDER — METHYLPREDNISOLONE SODIUM SUCC 125 MG IJ SOLR
125.0000 mg | Freq: Once | INTRAMUSCULAR | Status: AC
  Administered 2023-03-17: 125 mg via INTRAVENOUS
  Filled 2023-03-17: qty 2

## 2023-03-17 MED ORDER — LACTATED RINGERS IV BOLUS
500.0000 mL | Freq: Once | INTRAVENOUS | Status: AC
  Administered 2023-03-17: 500 mL via INTRAVENOUS

## 2023-03-17 NOTE — [facility] Notes (Signed)
.    Called for ongoing hypotension.  To date has received as needed lorazepam in addition to Precedex infusion for fairly significant confusion and agitation and combativeness.  I suspect these medications may be catching up on her.  The Precedex infusion has been reduced Plan Continue end-tidal CO2 monitoring 500 mL LR bolus May need to start peripheral pressors, I suspect this is sedation related  Simonne Martinet ACNP-BC [facility] Pulmonary/Critical Care Pager # [phone] OR # [phone] if no answer

## 2023-03-17 NOTE — ED Notes (Signed)
Patient ambulated to the bathroom. Walked past room and she was taking all of her leads off, she was redirectable, knew most orientation questions but did seem more confused.

## 2023-03-17 NOTE — [facility] Notes (Signed)
Now in ICU SBP in 70s. Not tachycardic Echo EF 50-55%, possible septal hypokinesis. No LV regional wm abnormality. RV size nml. No MR. AV poorly visualized. No obvious regurg   Dex has been off for about 30 minutes. I can get her to grimace to painful stim now.  Plan Start low doe NE for MAP > 65 Changed ativan to 1 mg from 1-4 PRN Stopped dex Cont to monitor.  Awaiting afternoon chem

## 2023-03-17 NOTE — ED Provider Triage Note (Signed)
Emergency Medicine Provider Triage Evaluation Note  Melissa Hayden , a 47 y.o. female  was evaluated in triage.  Pt complains of slurred speech, feeling off balance, dizziness as well as shortness of breath.  States she was last well at 2 AM but woke up at 1 PM with slurred speech.  Also feels increased shortness of breath.  Does have history of Marfan syndrome, aortic valve replacement on Coumadin.. Denies chest pain.  No weakness to arms or legs.  Review of Systems  Positive: Slurred speech, shortness of breath,  Negative: Weakness to arms or legs.  Chest pain  Physical Exam  BP 117/68 (BP Location: Right Arm)   Pulse (!) 51   Temp 98 F (36.7 C) (Oral)   Resp (!) 24   Ht 6\' 2"  (1.88 m)   Wt 70.8 kg   SpO2 98%   BMI 20.03 kg/m  Gen:   Awake,  dyspnea with conversation Resp:  Diminished breath sounds with scattered wheeze MSK:   Moves extremities without difficulty  Other:  Dysarthric speech, 5/5 strength throughout No facial droop  Medical Decision Making  Medically screening exam initiated at 4:08 AM.  Appropriate orders placed.  Toyia PAULA GASSLER was informed that the remainder of the evaluation will be completed by another provider, this initial triage assessment does not replace that evaluation, and the importance of remaining in the ED until their evaluation is complete.  Last seen normal 2 AM yesterday.  Out of window for code stroke activation.   Glynn Octave, MD 03/17/23 [phone]

## 2023-03-17 NOTE — ED Triage Notes (Signed)
Pt BIB [facility] d/t concerns for slurred speech. LNK 6/5 at 300 when pt went to sleep. Woke up 6/5 around 1 pm with slurred speech and feeling dizzy. LOV Negative. Is A&Ox3, confused to time. Pt is also c/o SOB. No cough. No chest pain.

## 2023-03-17 NOTE — Progress Notes (Signed)
ANTICOAGULATION CONSULT NOTE - Follow-up Consult  Pharmacy Consult for heparin Indication: History of aortic valve replacement setting of Marfan syndrome   Allergies  Allergen Reactions   Abilify [Aripiprazole] Other (See Comments)    "Flattened affect" "couldn't do anything while taking it"    Patient Measurements: Height: 6\' 2"  (188 cm) Weight: 70.8 kg (156 lb) IBW/kg (Calculated) : 77.7 Heparin Dosing Weight: 70.8 kg  Vital Signs: Temp: 98.4 F (36.9 C) (06/06 1718) Temp Source: Oral (06/06 1718) BP: 89/54 (06/06 1718) Pulse Rate: 58 (06/06 1718)  Labs: Recent Labs    03/17/23 0409 03/17/23 0415 03/17/23 0623 03/17/23 0656 03/17/23 0912 03/17/23 1651  HGB 11.4* 11.2*  --  10.9*  --   --   HCT 34.5* 33.0*  --  32.0*  --   --   PLT 212  --   --   --   --   --   APTT 35  --   --   --   --   --   LABPROT 15.5*  --   --   --   --   --   INR 1.2  --   --   --   --   --   HEPARINUNFRC  --   --   --   --   --  0.25*  CREATININE 1.06* 1.00  --   --  1.14* 1.08*  TROPONINIHS 15  --  15  --   --   --     Estimated Creatinine Clearance: 72.7 mL/min (A) (by C-G formula based on SCr of 1.08 mg/dL (H)).   Medical History: Past Medical History:  Diagnosis Date   Anticoagulant long-term use    after valve surgery for Marfan's syndrome   Anxiety    Ascending aorta dilatation (HCC)    Bipolar disorder (HCC)    Depression    Marfan's syndrome    Dr. Beverely Pace   Migraine    Mitral valve prolapse    Palpitations    Suicidal behavior    Tylenol Overdose   Varicose veins     Assessment: 47 yo female with history of aortic valve replacement setting of Marfan syndrome admitted with AMS. Patient previously on warfarin; however, does not seem to be taking based on medication dispense history (last filled 10/2022), documentation of non adherence, and subtherapeutic INR. Pharmacy consulted to dose IV heparin.   INR 1.2, Hgb 10.9, plt 212.   Update PM: Heparin level 0.25 on  1000 units/hr (after bolus).   Goal of Therapy:  Heparin level 0.3-0.7 units/ml Monitor platelets by anticoagulation protocol: Yes   Plan:  Increase Heparin IV infusion to 1150 units/hr  Recheck 6 hr heparin level  Check heparin level and CBC daily Monitor for s/sx of bleeding   Link Snuffer, PharmD, BCPS, BCCCP Clinical Pharmacist Please refer to [facility] for [facility] Pharmacy numbers 03/17/2023 6:01 PM

## 2023-03-17 NOTE — Progress Notes (Signed)
Labs reviewed  Osmolar gap < 10 so doubt we are dealing w/ ingestion of (ethanol, methanol, ethylene glycol, propylene glycol, isopropanol).  Lactate negative Beta hyroxybuteric acid is negative PCT reassuring Valproic acid <10  Still has NAGMA Still inclined to think this acute Toxic and metabolic encephalopathy is polypharmacy in nature   Plan Cont supportive care If f/u chem still w/ sig metabolic acidosis will add bicarb replacement

## 2023-03-17 NOTE — ED Notes (Signed)
Increased confusion, change in breathing pattern, patient placed on BiPap.

## 2023-03-17 NOTE — Progress Notes (Signed)
Pharmacy Antibiotic Note  Melissa Hayden is a 47 y.o. female admitted on 03/17/2023 with urosepsis. Pharmacy has been consulted for ceftriaxone dosing. Patient received cefepime 2g x1 and vancomycin 1g x1 in the ED.   WBC wnl, afebrile  Plan: Ceftriaxone 2g IV q24h  F/u de-escalation, length of therapy Pharmacy will sign off   Height: 6\' 2"  (188 cm) Weight: 70.8 kg (156 lb) IBW/kg (Calculated) : 77.7  Temp (24hrs), Avg:99.1 F (37.3 C), Min:98 F (36.7 C), Max:99.6 F (37.6 C)  Recent Labs  Lab 03/17/23 0409 03/17/23 0415 03/17/23 0720 03/17/23 0912 03/17/23 0942  WBC 9.4  --   --   --   --   CREATININE 1.06* 1.00  --  1.14*  --   LATICACIDVEN  --   --  0.8  --  1.5    Estimated Creatinine Clearance: 68.9 mL/min (A) (by C-G formula based on SCr of 1.14 mg/dL (H)).    Allergies  Allergen Reactions   Abilify [Aripiprazole] Other (See Comments)    "Flattened affect" "couldn't do anything while taking it"    Antimicrobials this admission: 6/6 Cefepime x1  6/6 CTX >>   Microbiology results: 6/6 BCx: sent 6/6 UCx: sent    Thank you for allowing pharmacy to be a part of this patient's care.  Andreas Ohm, PharmD Pharmacy Resident  03/17/2023 11:12 AM

## 2023-03-17 NOTE — H&P (Signed)
NAME:  Melissa Hayden, MRN:  [id number], DOB:  1976-08-09, LOS: 0 ADMISSION DATE:  03/17/2023, CONSULTATION DATE:  6/6 REFERRING MD:  floyd, CHIEF COMPLAINT: acute metabolic encephalopathy and metabolic acidosis    History of Present Illness:  47 year old chronically ill-appearing female patient is brought in by EMS on 6/6 for slurred speech confusion, and dizziness.  Last seen normal earlier that evening.  No fever cough rashes chest pain.  Reported headache earlier in the evening.  Was able to provide initial history. Was added in as a code stroke, became progressively confused while in the emergency room CT head was negative for acute findings, there were arterial manifestations of connective tissue disease with right ICA beading and left ICA fusiform aneurysm, the CT chest was negative for aortic dissection there is a small wedge-shaped region in the upper pole of the right kidney there was a 3.1 cm suprarenal aortic aneurysm.  Initial glucose 121, INR 1.2 white blood cell count 9.4 she had a normal anion gap metabolic acidosis ethanol level was less than 10.  Because of her progressive confusion she was placed on Precedex infusion critical care asked to admit  Pertinent  Medical History  Asthma, Marfan syndrome, prior aortic root and valve replacement.  On chronic Coumadin. Bipolar disease with prior suicidal attempt Current home medication list includes: Celexa, Coreg, Klonopin, Depakote ER, Zyprexa, rizatriptan for headache, trazodone and warfarin Significant Hospital Events: Including procedures, antibiotic start and stop dates in addition to other pertinent events   6/6 admitted with acute mental status change  Interim History / Subjective:  Confused but redirectable currently  Objective   Blood pressure 110/71, pulse 94, temperature 99.6 F (37.6 C), temperature source Rectal, resp. rate 17, height 6\' 2"  (1.88 m), weight 70.8 kg, SpO2 95 %.       No intake or output data in the 24  hours ending 03/17/23 0841 Filed Weights   03/17/23 0354  Weight: 70.8 kg    Examination: General: Chronically ill-appearing 47 year old female patient currently laying in bed sedated on Precedex at 0.8 HENT: Normocephalic atraumatic sclera nonicteric no JVD Lungs: Clear diminished bases currently on room air Cardiovascular: Regular rhythm with positive valvular click, old sternal surgical site well-healed Abdomen: Soft nontender Extremities: Warm dry brisk capillary refill no edema Neuro: Awakens to voice and soft stimulus, earlier was agitated.  Oriented x 2 speech is slurred GU: Due to void  Resolved Hospital Problem list     Assessment & Plan:  Acute toxic and metabolic encephalopathy.  Has a strong psychiatric history including bipolar disease and prior suicide attempt, also with normal anion gap metabolic acidosis likely contributing.  With possible pyelo on CT also consider infectious etiology Plan Continuing Precedex Admit to intensive care 12-lead Holding her home Zyprexa, trazodone, Depakote, clonazepam and Celexa Sending serum osmolality Sending urine drug screen Sending blood culture Check procalcitonin Urinary culture  Possible right pyelonephritis Plan Cultures as above Empiric ceftriaxone   Normal anion gap metabolic acidosis, borderline anion gap Lactate normal Plan Follow-up beta hydroxybutyric acid Gentle hydration Serum Osmo sent  History of aortic valve replacement setting of Marfan syndrome Plan Holding warfarin given altered mental status IV heparin per pharmacy Holding carvedilol for now Telemetry monitoring  Anemia without evidence of bleeding Plan Monitor Transfusion triggers hemoglobin less than 7  Left ICA fusiform aneurysm Plan Will need outpatient follow-up  Suprarenal aortic aneurysm Plan Outpatient follow-up   Diet/type: N/A DVT prophylaxis: systemic heparin GI prophylaxis: N/A Lines: N/A Foley:  N/A Code Status:   full code Last date of multidisciplinary goals of care discussion [pending]  Labs   CBC: Recent Labs  Lab 03/17/23 0409 03/17/23 0415 03/17/23 0656  WBC 9.4  --   --   NEUTROABS 7.2  --   --   HGB 11.4* 11.2* 10.9*  HCT 34.5* 33.0* 32.0*  MCV 87.8  --   --   PLT 212  --   --     Basic Metabolic Panel: Recent Labs  Lab 03/17/23 0409 03/17/23 0415 03/17/23 0656  NA 138 143 141  K 3.5 3.6 3.5  CL 111 124*  --   CO2 13*  --   --   GLUCOSE 101* 106*  --   BUN 18 16  --   CREATININE 1.06* 1.00  --   CALCIUM 7.9*  --   --    GFR: Estimated Creatinine Clearance: 78.6 mL/min (by C-G formula based on SCr of 1 mg/dL). Recent Labs  Lab 03/17/23 0409 03/17/23 0720  WBC 9.4  --   LATICACIDVEN  --  0.8    Liver Function Tests: Recent Labs  Lab 03/17/23 0409  AST 21  ALT 12  ALKPHOS 44  BILITOT 0.3  PROT 6.5  ALBUMIN 3.7   No results for input(s): "LIPASE", "AMYLASE" in the last 168 hours. No results for input(s): "AMMONIA" in the last 168 hours.  ABG    Component Value Date/Time   PHART 7.398 03/17/2023 0656   PCO2ART 20.4 (L) 03/17/2023 0656   PO2ART 146 (H) 03/17/2023 0656   HCO3 12.6 (L) 03/17/2023 0656   TCO2 13 (L) 03/17/2023 0656   ACIDBASEDEF 10.0 (H) 03/17/2023 0656   O2SAT 99 03/17/2023 0656     Coagulation Profile: Recent Labs  Lab 03/17/23 0409  INR 1.2    Cardiac Enzymes: No results for input(s): "CKTOTAL", "CKMB", "CKMBINDEX", "TROPONINI" in the last 168 hours.  HbA1C: Hgb A1c MFr Bld  Date/Time Value Ref Range Status  03/31/2021 02:52 PM 5.3 4.6 - 6.5 % Final    Comment:    Glycemic Control Guidelines for People with Diabetes:Non Diabetic:  <6%Goal of Therapy: <7%Additional Action Suggested:  >8%   10/31/2017 03:26 PM 5.2 4.6 - 6.5 % Final    Comment:    Glycemic Control Guidelines for People with Diabetes:Non Diabetic:  <6%Goal of Therapy: <7%Additional Action Suggested:  >8%     CBG: Recent Labs  Lab 03/17/23 0407  03/17/23 0659  GLUCAP 121* 128*    Review of Systems:   Not able   Past Medical History:  She,  has a past medical history of Anticoagulant long-term use, Anxiety, Ascending aorta dilatation (HCC), Bipolar disorder (HCC), Depression, Marfan's syndrome, Migraine, Mitral valve prolapse, Palpitations, Suicidal behavior, and Varicose veins.   Surgical History:   Past Surgical History:  Procedure Laterality Date   ANKLE SURGERY Left    BREAST CYST EXCISION     CARDIAC VALVE SURGERY  09/2014   AV, MV replacement, aortic root replacement (Dr Kizzie Bane at [facility])   ICD IMPLANT N/A 01/05/2018   Procedure: ICD IMPLANT;  Surgeon: Marinus Maw, MD;  Location: [facility] INVASIVE CV LAB;  Service: Cardiovascular;  Laterality: N/A;   INNER EAR SURGERY Right      Social History:   reports that she has been smoking cigarettes. She has a 18.00 pack-year smoking history. She uses smokeless tobacco. She reports that she does not drink alcohol and does not use drugs.   Family History:  Her  family history includes Cancer (age of onset: 56) in her mother; Heart disease in her father; Marfan syndrome in her father, paternal grandmother, sister, and sister.   Allergies Allergies  Allergen Reactions   Abilify [Aripiprazole] Other (See Comments)    "Flattened affect" "couldn't do anything while taking it"     Home Medications  Prior to Admission medications   Medication Sig Start Date End Date Taking? Authorizing Provider  acetaminophen (TYLENOL) 650 MG CR tablet Take 1,300 mg by mouth every 8 (eight) hours as needed (for headache/pain relief).    [provider]  carvedilol (COREG) 6.25 MG tablet TAKE 1 TABLET BY MOUTH TWICE A DAY 06/14/18   Rollene Rotunda, MD  citalopram (CELEXA) 20 MG tablet Take one tab daily 07/20/22   Arfeen, Phillips Grout, MD  clonazePAM (KLONOPIN) 0.5 MG disintegrating tablet Take 1 tablet (0.5 mg total) by mouth 3 (three) times daily as needed for seizure. Patient not taking:  Reported on 04/21/2022 12/29/21   Janeece Agee, NP  divalproex (DEPAKOTE ER) 500 MG 24 hr tablet Take 2 tablets (1,000 mg total) by mouth at bedtime. 07/20/22   Arfeen, Phillips Grout, MD  EPINEPHrine 0.3 mg/0.3 mL IJ SOAJ injection USE AS DIRECTED WHEN NEEDED 03/31/21   Janeece Agee, NP  hydrOXYzine (ATARAX) 10 MG tablet Take 1 tablet (10 mg total) by mouth 3 (three) times daily as needed. Patient not taking: Reported on 11/11/2021 09/08/21   Janeece Agee, NP  Multiple Vitamin (MULTIVITAMIN WITH MINERALS) TABS tablet Take 1 tablet by mouth daily. Centrum Ultra Women's Oral    [provider]  OLANZapine (ZYPREXA) 20 MG tablet TAKE 1 TABLET BY MOUTH EVERYDAY AT BEDTIME 07/20/22   Arfeen, Phillips Grout, MD  rizatriptan (MAXALT) 10 MG tablet TAKE 1 TABLET BY MOUTH AS NEEDED FOR MIGRAINE. MAY REPEAT IN 2 HOURS IF NEEDED 01/05/22   Janeece Agee, NP  traZODone (DESYREL) 150 MG tablet Take 1 tablet (150 mg total) by mouth at bedtime. TAKE 1 TABLET BY MOUTH EVERYDAY AT BEDTIME 07/20/22   Arfeen, Phillips Grout, MD  vortioxetine HBr (TRINTELLIX) 10 MG TABS tablet Take 1 tablet (10 mg total) by mouth daily. Patient not taking: Reported on 04/21/2022 04/12/22   Cleotis Nipper, MD  warfarin (COUMADIN) 5 MG tablet TAKE 1 TABLET DAILY ON SUN,MON,WED,FRI,SAT. TAKES 7.5 MG ON TU AND THU 04/12/22   Janeece Agee, NP     Critical care time: 32 min      Simonne Martinet ACNP-BC [facility] Pulmonary/Critical Care Pager # [phone] OR # [phone] if no answer

## 2023-03-17 NOTE — Progress Notes (Signed)
Afternoon chemistry reviewed  Plan Will cont rx as outlined.  No indication for bicarb replacement

## 2023-03-17 NOTE — ED Notes (Signed)
Please updated family  [phone]

## 2023-03-17 NOTE — ED Notes (Signed)
Np Babcock at bedside, dc precedes gtt per his instructions

## 2023-03-17 NOTE — ED Notes (Signed)
ED TO INPATIENT HANDOFF REPORT  ED Nurse Name and Phone #: Ned Clines Name/Age/Gender Melissa Hayden 47 y.o. female Room/Bed: 019C/019C  Code Status   Code Status: Full Code  Home/SNF/Other Home Patient oriented to: self Is this baseline? No   Triage Complete: Triage complete  Chief Complaint Acute metabolic encephalopathy [G93.41]  Triage Note Pt BIB [facility] d/t concerns for slurred speech. LNK 6/5 at 300 when pt went to sleep. Woke up 6/5 around 1 pm with slurred speech and feeling dizzy. LOV Negative. Is A&Ox3, confused to time. Pt is also c/o SOB. No cough. No chest pain.       Allergies Allergies  Allergen Reactions   Abilify [Aripiprazole] Other (See Comments)    "Flattened affect" "couldn't do anything while taking it"    Level of Care/Admitting Diagnosis ED Disposition     ED Disposition  Admit   Condition  --   Comment  Hospital [facility] [[id number]]  Level of Care: ICU [6]  May [facility] patient to [facility] if equivalent level of care is available:: No  Covid Evaluation: Asymptomatic - no recent exposure (last 10 days) testing not required  Diagnosis: Acute metabolic encephalopathy [1308657]  Admitting Physician: Oretha Milch [[id number]]  Attending Physician: Oretha Milch [[id number]]  Certification:: I certify this patient will need inpatient services for at least 2 midnights  Estimated Length of Stay: 2          B Medical/Surgery History Past Medical History:  Diagnosis Date   Anticoagulant long-term use    after valve surgery for Marfan's syndrome   Anxiety    Ascending aorta dilatation (HCC)    Bipolar disorder (HCC)    Depression    Marfan's syndrome    Dr. Beverely Pace   Migraine    Mitral valve prolapse    Palpitations    Suicidal behavior    Tylenol Overdose   Varicose veins    Past Surgical History:  Procedure Laterality Date   ANKLE SURGERY Left    BREAST CYST EXCISION     CARDIAC VALVE  SURGERY  09/2014   AV, MV replacement, aortic root replacement (Dr Kizzie Bane at [facility])   ICD IMPLANT N/A 01/05/2018   Procedure: ICD IMPLANT;  Surgeon: Marinus Maw, MD;  Location: [facility] INVASIVE CV LAB;  Service: Cardiovascular;  Laterality: N/A;   INNER EAR SURGERY Right      A IV Location/Drains/Wounds Patient Lines/Drains/Airways Status     Active Line/Drains/Airways     Name Placement date Placement time Site Days   Peripheral IV 03/17/23 18 G Right Forearm 03/17/23  0408  Forearm  less than 1   Peripheral IV 03/17/23 20 G Anterior;Left Forearm 03/17/23  0753  Forearm  less than 1   Peripheral IV 03/17/23 20 G Anterior;Distal;Right Forearm 03/17/23  0655  Forearm  less than 1   Incision (Closed) 01/05/18 Chest Left;Upper 01/05/18  1703  -- 1897            Intake/Output Last 24 hours  Intake/Output Summary (Last 24 hours) at 03/17/2023 1523 Last data filed at 03/17/2023 1443 Gross per 24 hour  Intake 2000 ml  Output --  Net 2000 ml    Labs/Imaging Results for orders placed or performed during the hospital encounter of 03/17/23 (from the past 48 hour(s))  CBG monitoring, ED     Status: Abnormal   Collection Time: 03/17/23  4:07 AM  Result Value Ref Range  Glucose-Capillary 121 (H) 70 - 99 mg/dL    Comment: Glucose reference range applies only to samples taken after fasting for at least 8 hours.  Protime-INR     Status: Abnormal   Collection Time: 03/17/23  4:09 AM  Result Value Ref Range   Prothrombin Time 15.5 (H) 11.4 - 15.2 seconds   INR 1.2 0.8 - 1.2    Comment: (NOTE) INR goal varies based on device and disease states. Performed at [facility], [address] [place], [place] [address]   APTT     Status: None   Collection Time: 03/17/23  4:09 AM  Result Value Ref Range   aPTT 35 24 - 36 seconds    Comment: Performed at [facility], [address] [place], [place] [address]  CBC     Status: Abnormal   Collection Time: 03/17/23  4:09 AM  Result  Value Ref Range   WBC 9.4 4.0 - 10.5 K/uL   RBC 3.93 3.87 - 5.11 MIL/uL   Hemoglobin 11.4 (L) 12.0 - 15.0 g/dL   HCT 32.4 (L) 40.1 - 02.7 %   MCV 87.8 80.0 - 100.0 fL   MCH 29.0 26.0 - 34.0 pg   MCHC 33.0 30.0 - 36.0 g/dL   RDW 25.3 (H) 66.4 - 40.3 %   Platelets 212 150 - 400 K/uL   nRBC 0.0 0.0 - 0.2 %    Comment: Performed at [facility], [address] [place], [place] [address]  Differential     Status: None   Collection Time: 03/17/23  4:09 AM  Result Value Ref Range   Neutrophils Relative % 76 %   Neutro Abs 7.2 1.7 - 7.7 K/uL   Lymphocytes Relative 14 %   Lymphs Abs 1.4 0.7 - 4.0 K/uL   Monocytes Relative 9 %   Monocytes Absolute 0.8 0.1 - 1.0 K/uL   Eosinophils Relative 1 %   Eosinophils Absolute 0.1 0.0 - 0.5 K/uL   Basophils Relative 0 %   Basophils Absolute 0.0 0.0 - 0.1 K/uL   Immature Granulocytes 0 %   Abs Immature Granulocytes 0.04 0.00 - 0.07 K/uL    Comment: Performed at [facility], [address] [place], [place] [address]  Comprehensive metabolic panel     Status: Abnormal   Collection Time: 03/17/23  4:09 AM  Result Value Ref Range   Sodium 138 135 - 145 mmol/L   Potassium 3.5 3.5 - 5.1 mmol/L   Chloride 111 98 - 111 mmol/L   CO2 13 (L) 22 - 32 mmol/L   Glucose, Bld 101 (H) 70 - 99 mg/dL    Comment: Glucose reference range applies only to samples taken after fasting for at least 8 hours.   BUN 18 6 - 20 mg/dL   Creatinine, Ser 7.56 (H) 0.44 - 1.00 mg/dL   Calcium 7.9 (L) 8.9 - 10.3 mg/dL   Total Protein 6.5 6.5 - 8.1 g/dL   Albumin 3.7 3.5 - 5.0 g/dL   AST 21 15 - 41 U/L   ALT 12 0 - 44 U/L   Alkaline Phosphatase 44 38 - 126 U/L   Total Bilirubin 0.3 0.3 - 1.2 mg/dL   GFR, Estimated >43 >32 mL/min    Comment: (NOTE) Calculated using the CKD-EPI Creatinine Equation (2021)    Anion gap 14 5 - 15    Comment: Performed at [facility], [address] [place], [place] [address]  Ethanol  Status: None   Collection Time:  03/17/23  4:09 AM  Result Value Ref Range   Alcohol, Ethyl (B) <10 <10 mg/dL    Comment: (NOTE) Lowest detectable limit for serum alcohol is 10 mg/dL.  For medical purposes only. Performed at [facility], [address] [place], [place] [address]   Troponin I (High Sensitivity)     Status: None   Collection Time: 03/17/23  4:09 AM  Result Value Ref Range   Troponin I (High Sensitivity) 15 <18 ng/L    Comment: (NOTE) Elevated high sensitivity troponin I (hsTnI) values and significant  changes across serial measurements may suggest ACS but many other  chronic and acute conditions are known to elevate hsTnI results.  Refer to the "Links" section for chest pain algorithms and additional  guidance. Performed at [facility], [address], [place], [place] [address]   I-stat chem 8, ED     Status: Abnormal   Collection Time: 03/17/23  4:15 AM  Result Value Ref Range   Sodium 143 135 - 145 mmol/L   Potassium 3.6 3.5 - 5.1 mmol/L   Chloride 124 (H) 98 - 111 mmol/L   BUN 16 6 - 20 mg/dL   Creatinine, Ser 6.21 0.44 - 1.00 mg/dL   Glucose, Bld 308 (H) 70 - 99 mg/dL    Comment: Glucose reference range applies only to samples taken after fasting for at least 8 hours.   Calcium, Ion 0.82 (LL) 1.15 - 1.40 mmol/L   TCO2 16 (L) 22 - 32 mmol/L   Hemoglobin 11.2 (L) 12.0 - 15.0 g/dL   HCT 65.7 (L) 84.6 - 96.2 %   Comment NOTIFIED PHYSICIAN   I-Stat beta hCG blood, ED     Status: None   Collection Time: 03/17/23  4:16 AM  Result Value Ref Range   I-stat hCG, quantitative <5.0 <5 mIU/mL   Comment 3            Comment:   GEST. AGE      CONC.  (mIU/mL)   <=1 WEEK        5 - 50     2 WEEKS       50 - 500     3 WEEKS       100 - 10,000     4 WEEKS     1,000 - 30,000        FEMALE AND NON-PREGNANT FEMALE:     LESS THAN 5 mIU/mL   Troponin I (High Sensitivity)     Status: None   Collection Time: 03/17/23  6:23 AM  Result Value Ref Range   Troponin I (High Sensitivity) 15 <18  ng/L    Comment: (NOTE) Elevated high sensitivity troponin I (hsTnI) values and significant  changes across serial measurements may suggest ACS but many other  chronic and acute conditions are known to elevate hsTnI results.  Refer to the "Links" section for chest pain algorithms and additional  guidance. Performed at [facility], [address], [place], [place] [address]   Brain natriuretic peptide     Status: Abnormal   Collection Time: 03/17/23  6:23 AM  Result Value Ref Range   B Natriuretic Peptide 198.2 (H) 0.0 - 100.0 pg/mL    Comment: Performed at [facility], [address], [place], [place] [address]  I-Stat arterial blood gas, ED [facility], [facility])     Status: Abnormal   Collection Time: 03/17/23  6:56 AM  Result Value Ref Range   pH, Arterial 7.398 7.35 - 7.45   pCO2 arterial 20.4 (L) 32 - 48 mmHg   pO2, Arterial 146 (H) 83 - 108 mmHg   Bicarbonate 12.6 (L) 20.0 - 28.0 mmol/L   TCO2 13 (L) 22 - 32 mmol/L   O2 Saturation 99 %   Acid-base deficit 10.0 (H) 0.0 - 2.0 mmol/L   Sodium 141 135 - 145 mmol/L   Potassium 3.5 3.5 - 5.1 mmol/L   Calcium, Ion 0.89 (LL) 1.15 - 1.40 mmol/L   HCT 32.0 (L) 36.0 - 46.0 %   Hemoglobin 10.9 (L) 12.0 - 15.0 g/dL   Patient temperature 16.1 F    Sample type ARTERIAL    Comment NOTIFIED PHYSICIAN   CBG monitoring, ED     Status: Abnormal   Collection Time: 03/17/23  6:59 AM  Result Value Ref Range   Glucose-Capillary 128 (H) 70 - 99 mg/dL    Comment: Glucose reference range applies only to samples taken after fasting for at least 8 hours.  Lactic acid, plasma     Status: None   Collection Time: 03/17/23  7:20 AM  Result Value Ref Range   Lactic Acid, Venous 0.8 0.5 - 1.9 mmol/L    Comment: Performed at [facility], [address] [place], [place] [address]  Urinalysis, Routine w reflex microscopic -Urine, Clean Catch     Status: Abnormal   Collection Time: 03/17/23  8:00 AM  Result Value Ref Range   Color,  Urine YELLOW YELLOW   APPearance CLEAR CLEAR   Specific Gravity, Urine 1.036 (H) 1.005 - 1.030   pH 5.0 5.0 - 8.0   Glucose, UA NEGATIVE NEGATIVE mg/dL   Hgb urine dipstick MODERATE (A) NEGATIVE   Bilirubin Urine NEGATIVE NEGATIVE   Ketones, ur 20 (A) NEGATIVE mg/dL   Protein, ur NEGATIVE NEGATIVE mg/dL   Nitrite NEGATIVE NEGATIVE   Leukocytes,Ua NEGATIVE NEGATIVE   RBC / HPF 0-5 0 - 5 RBC/hpf   WBC, UA 0-5 0 - 5 WBC/hpf   Bacteria, UA NONE SEEN NONE SEEN   Squamous Epithelial / HPF 0-5 0 - 5 /HPF    Comment: Performed at [facility], [address] [place], [place] [address]  Rapid urine drug screen (hospital performed)     Status: None   Collection Time: 03/17/23  8:00 AM  Result Value Ref Range   Opiates NONE DETECTED NONE DETECTED   Cocaine NONE DETECTED NONE DETECTED   Benzodiazepines NONE DETECTED NONE DETECTED   Amphetamines NONE DETECTED NONE DETECTED   Tetrahydrocannabinol NONE DETECTED NONE DETECTED   Barbiturates NONE DETECTED NONE DETECTED    Comment: (NOTE) DRUG SCREEN FOR MEDICAL PURPOSES ONLY.  IF CONFIRMATION IS NEEDED FOR ANY PURPOSE, NOTIFY LAB WITHIN 5 DAYS.  LOWEST DETECTABLE LIMITS FOR URINE DRUG SCREEN Drug Class                     Cutoff (ng/mL) Amphetamine and metabolites    1000 Barbiturate and metabolites    200 Benzodiazepine                 200 Opiates and metabolites        300 Cocaine and metabolites        300 THC                            50 Performed at  [facility], [address] [place] [place], [place] [address]   Osmolality     Status: Abnormal   Collection Time: 03/17/23  9:12 AM  Result Value Ref Range   Osmolality 308 (H) 275 - 295 mOsm/kg    Comment: REPEATED TO VERIFY Performed at [facility], [address] [place], [place] [address]   Procalcitonin     Status: None   Collection Time: 03/17/23  9:12 AM  Result Value Ref Range   Procalcitonin <0.10 ng/mL    Comment:        Interpretation: PCT  (Procalcitonin) <= 0.5 ng/mL: Systemic infection (sepsis) is not likely. Local bacterial infection is possible. (NOTE)       Sepsis PCT Algorithm           Lower Respiratory Tract                                      Infection PCT Algorithm    ----------------------------     ----------------------------         PCT < 0.25 ng/mL                PCT < 0.10 ng/mL          Strongly encourage             Strongly discourage   discontinuation of antibiotics    initiation of antibiotics    ----------------------------     -----------------------------       PCT 0.25 - 0.50 ng/mL            PCT 0.10 - 0.25 ng/mL               OR       >80% decrease in PCT            Discourage initiation of                                            antibiotics      Encourage discontinuation           of antibiotics    ----------------------------     -----------------------------         PCT >= 0.50 ng/mL              PCT 0.26 - 0.50 ng/mL               AND        <80% decrease in PCT             Encourage initiation of                                             antibiotics       Encourage continuation           of antibiotics    ----------------------------     -----------------------------        PCT >= 0.50 ng/mL                  PCT > 0.50 ng/mL               AND  increase in PCT                  Strongly encourage                                      initiation of antibiotics    Strongly encourage escalation           of antibiotics                                     -----------------------------                                           PCT <= 0.25 ng/mL                                                 OR                                        > 80% decrease in PCT                                      Discontinue / Do not initiate                                             antibiotics  Performed at [facility], [address] [place], [place] [address]   HIV Antibody  (routine testing w rflx)     Status: None   Collection Time: 03/17/23  9:12 AM  Result Value Ref Range   HIV Screen 4th Generation wRfx Non Reactive Non Reactive    Comment: Performed at [facility], [address], [place], [place] [address]  Comprehensive metabolic panel     Status: Abnormal   Collection Time: 03/17/23  9:12 AM  Result Value Ref Range   Sodium 141 135 - 145 mmol/L   Potassium 3.5 3.5 - 5.1 mmol/L   Chloride 113 (H) 98 - 111 mmol/L   CO2 13 (L) 22 - 32 mmol/L   Glucose, Bld 158 (H) 70 - 99 mg/dL    Comment: Glucose reference range applies only to samples taken after fasting for at least 8 hours.   BUN 17 6 - 20 mg/dL   Creatinine, Ser 0.98 (H) 0.44 - 1.00 mg/dL   Calcium 7.6 (L) 8.9 - 10.3 mg/dL   Total Protein 6.2 (L) 6.5 - 8.1 g/dL   Albumin 3.4 (L) 3.5 - 5.0 g/dL   AST 24 15 - 41 U/L   ALT 13 0 - 44 U/L   Alkaline Phosphatase 39 38 - 126 U/L   Total Bilirubin 0.5 0.3 - 1.2 mg/dL   GFR, Estimated >11 >91 mL/min    Comment: (NOTE) Calculated using the CKD-EPI Creatinine Equation (2021)  Anion gap 15 5 - 15    Comment: Performed at [facility], [address] [place], [place] [address]  Valproic acid level     Status: Abnormal   Collection Time: 03/17/23  9:12 AM  Result Value Ref Range   Valproic Acid Lvl <10 (L) 50.0 - 100.0 ug/mL    Comment: RESULT CONFIRMED BY MANUAL DILUTION Performed at [facility], [address] [place], [place] [address]   Lactic acid, plasma     Status: None   Collection Time: 03/17/23  9:42 AM  Result Value Ref Range   Lactic Acid, Venous 1.5 0.5 - 1.9 mmol/L    Comment: Performed at [facility], [address] [place], [place] [address]  Beta-hydroxybutyric acid     Status: None   Collection Time: 03/17/23  9:42 AM  Result Value Ref Range   Beta-Hydroxybutyric Acid 0.24 0.05 - 0.27 mmol/L    Comment: Performed at [facility], [address], [place], [place] [address]   ECHOCARDIOGRAM  COMPLETE  Result Date: 03/17/2023    ECHOCARDIOGRAM REPORT   Patient Name:   QUINLYNN DITTMAR Date of Exam: 03/17/2023 Medical Rec #:  [id number]    Height:       74.0 in Accession #:    [id number]   Weight:       156.0 lb Date of Birth:  [birth year]   BSA:          1.956 m Patient Age:    46 years     BP:           110/71 mmHg Patient Gender: F            HR:           92 bpm. Exam Location:  Inpatient Procedure: 2D Echo, Cardiac Doppler and Color Doppler STAT ECHO Indications:    CHF  History:        Patient has prior history of Echocardiogram examinations, most                 recent 11/14/2017. Aortic Valve Disease and Mitral Valve Disease.  Sonographer:    Darlys Gales Referring Phys: [id number] RAKESH V ALVA  Sonographer Comments: Image acquisition challenging due to patient behavioral factors., Image acquisition challenging due to uncooperative patient and Image acquisition challenging due to respiratory motion. IMPRESSIONS  1. Poor quality study Needs TEE to further assess mechanical valves Can also consider fluroscopy to r/o stenotic disc motion in bi leaflet valves.  2. Poor endocardial visualization ? septal hypokinesis. Left ventricular ejection fraction, by estimation, is 50 to 55%. The left ventricle has low normal function. The left ventricle has no regional wall motion abnormalities. Left ventricular diastolic  parameters are indeterminate.  3. ? pacing wires in RA/RV. Right ventricular systolic function is moderately reduced. The right ventricular size is moderately enlarged.  4. 29 mm Mechanical St Jude valve not well visualized diastolic gradients severely elevated mean 20 mmHg at HR 82 bpm . The mitral valve has been repaired/replaced. No evidence of mitral valve regurgitation. No evidence of mitral stenosis.  5. 23 mm St Jude AVR not well visulazied mean gradient elevated 23 mmHg peak 31 mmHg also appears to be some AR that is poorly evaluated. The aortic valve has been repaired/replaced. Aortic valve  regurgitation is not visualized. No aortic stenosis is present.  6. The inferior vena cava is normal in size with greater than 50% respiratory variability, suggesting  right atrial pressure of 3 mmHg. FINDINGS  Left Ventricle: Poor endocardial visualization ? septal hypokinesis. Left ventricular ejection fraction, by estimation, is 50 to 55%. The left ventricle has low normal function. The left ventricle has no regional wall motion abnormalities. The left ventricular internal cavity size was normal in size. There is no left ventricular hypertrophy. Left ventricular diastolic parameters are indeterminate. Right Ventricle: ? pacing wires in RA/RV. The right ventricular size is moderately enlarged. No increase in right ventricular wall thickness. Right ventricular systolic function is moderately reduced. Left Atrium: Left atrial size was normal in size. Right Atrium: Right atrial size was normal in size. Pericardium: There is no evidence of pericardial effusion. Mitral Valve: 29 mm Mechanical St Jude valve not well visualized diastolic gradients severely elevated mean 20 mmHg at HR 82 bpm. The mitral valve has been repaired/replaced. No evidence of mitral valve regurgitation. There is a St. Jude mechanical valve  present in the mitral position. No evidence of mitral valve stenosis. MV peak gradient, 23.8 mmHg. The mean mitral valve gradient is 16.0 mmHg. Tricuspid Valve: The tricuspid valve is normal in structure. Tricuspid valve regurgitation is mild . No evidence of tricuspid stenosis. Aortic Valve: 23 mm St Jude AVR not well visulazied mean gradient elevated 23 mmHg peak 31 mmHg also appears to be some AR that is poorly evaluated. The aortic valve has been repaired/replaced. Aortic valve regurgitation is not visualized. No aortic stenosis is present. Aortic valve mean gradient measures 23.0 mmHg. Aortic valve peak gradient measures 30.9 mmHg. There is a St. Jude bileaflet valve present in the aortic position.  Pulmonic Valve: The pulmonic valve was not well visualized. Pulmonic valve regurgitation is mild. No evidence of pulmonic stenosis. Aorta: The aortic root is normal in size and structure. Venous: The inferior vena cava is normal in size with greater than 50% respiratory variability, suggesting right atrial pressure of 3 mmHg. IAS/Shunts: No atrial level shunt detected by color flow Doppler. Additional Comments: Poor quality study Needs TEE to further assess mechanical valves Can also consider fluroscopy to r/o stenotic disc motion in bi leaflet valves.  LEFT VENTRICLE PLAX 2D LVIDd:         5.80 cm LVIDs:         3.50 cm LV PW:         1.10 cm LV IVS:        0.90 cm LVOT diam:     1.90 cm LVOT Area:     2.84 cm  AORTIC VALVE AV Vmax:      278.00 cm/s AV Vmean:     237.000 cm/s AV VTI:       0.581 m AV Peak Grad: 30.9 mmHg AV Mean Grad: 23.0 mmHg  AORTA Ao Root diam: 2.90 cm Ao Asc diam:  2.70 cm MITRAL VALVE                TRICUSPID VALVE MV Area (PHT): 2.46 cm     TR Peak grad:   21.5 mmHg MV Peak grad:  23.8 mmHg    TR Vmax:        232.00 cm/s MV Mean grad:  16.0 mmHg MV Vmax:       2.44 m/s     SHUNTS MV Vmean:      196.0 cm/s   Systemic Diam: 1.90 cm MV Decel Time: 309 msec MV E velocity: 179.00 cm/s MV A velocity: 193.00 cm/s MV E/A ratio:  0.93 Charlton Haws MD Electronically signed by Charlton Haws  MD Signature Date/Time: 03/17/2023/9:52:02 AM    Final    CT Angio Chest/Abd/Pel for Dissection W and/or Wo Contrast  Result Date: 03/17/2023 CLINICAL DATA:  Suspected acute aortic syndrome, slurred speech EXAM: CT ANGIOGRAPHY CHEST, ABDOMEN AND PELVIS TECHNIQUE: Multidetector CT imaging through the chest, abdomen and pelvis was performed using the standard protocol during bolus administration of intravenous contrast. Multiplanar reconstructed images and MIPs were obtained and reviewed to evaluate the vascular anatomy. RADIATION DOSE REDUCTION: This exam was performed according to the departmental  dose-optimization program which includes automated exposure control, adjustment of the mA and/or kV according to patient size and/or use of iterative reconstruction technique. CONTRAST:  75mL OMNIPAQUE IOHEXOL 350 MG/ML SOLN COMPARISON:  12/06/2016 FINDINGS: CTA CHEST FINDINGS Cardiovascular: Left subclavian AICD extends towards the RV apex. The SVC is patent. Borderline cardiomegaly with left atrial enlargement. No pericardial effusion. Dilated central pulmonary arteries suggesting pulmonary arterial hypertension. Satisfactory opacification of pulmonary arteries noted, and there is no evidence of pulmonary emboli. Post MVR. Post AVR. Mild scattered coronary calcifications. Adequate contrast opacification of the thoracic aorta with no evidence of dissection, aneurysm, or stenosis. There is classic 3-vessel brachiocephalic arch anatomy without proximal stenosis. Minimal scattered calcified plaque in the arch and descending segment. Mediastinum/Nodes: No mass or adenopathy. Lungs/Pleura: No pleural effusion. No pneumothorax. Pulmonary emphysema. Dependent atelectasis posteriorly in both lungs. Musculoskeletal: Sternotomy wires. Benign T5 vertebral body hemangioma. No acute findings. Review of the MIP images confirms the above findings. CTA ABDOMEN AND PELVIS FINDINGS VASCULAR Aorta: Borderline 3.1 cm dilatation of the suprarenal aorta, with scattered calcified plaque, no infrarenal aneurysm. No dissection or stenosis. Celiac: Patent without evidence of aneurysm, dissection, vasculitis or significant stenosis. SMA: Patent without evidence of aneurysm, dissection, vasculitis or significant stenosis. Renals: Both renal arteries are patent without evidence of aneurysm, dissection, vasculitis, fibromuscular dysplasia or significant stenosis. IMA: Patent without evidence of aneurysm, dissection, vasculitis or significant stenosis. Inflow: Mild scattered atheromatous calcified plaque in bilateral common iliac arteries. No  aneurysm, dissection, or stenosis. Veins: No obvious venous abnormality within the limitations of this arterial phase study. Review of the MIP images confirms the above findings. NON-VASCULAR Hepatobiliary: No focal liver lesion or biliary ductal dilatation. The gallbladder is distended, without calcified gallstones. Pancreas: Unremarkable. No pancreatic ductal dilatation or surrounding inflammatory changes. Spleen: Normal in size without focal abnormality. Adrenals/Urinary Tract: No adrenal mass. Small wedge-shaped region of hypoenhancement in the upper pole right kidney, new since previous. No hydronephrosis. Urinary bladder is distended. Stomach/Bowel: Stomach is partially distended, unremarkable. Small bowel decompressed. Appendix not discretely identified. No pericecal inflammatory/edematous change. The colon is partially distended by gas and fecal material without acute finding. Lymphatic: No abdominal or pelvic adenopathy. Reproductive: Uterus and bilateral adnexa are unremarkable. Other: No ascites.  No free air. Musculoskeletal: Large sacral Tarlov cysts. Mild lumbar levoscoliosis with degenerative disc disease L2-3. Review of the MIP images confirms the above findings. IMPRESSION: 1. No evidence of aortic dissection or other acute vascular pathology. 2. Small wedge-shaped region of hypoenhancement in the upper pole right kidney, new since previous. This could represent a small infarct or pyelonephritis. 3. 3.1 cm suprarenal abdominal aortic aneurysm. Recommend follow-up ultrasound every 3 years all all all all. This recommendation follows ACR consensus guidelines: White Paper of the ACR Incidental Findings Committee II on Vascular Findings. J Am Coll Radiol 2013; 16:109-604. 4. Aortic Atherosclerosis (ICD10-I70.0) and Emphysema (ICD10-J43.9). Electronically Signed   By: Corlis Leak M.D.   On: 03/17/2023 06:27   CT HEAD  WO CONTRAST  Result Date: 03/17/2023 CLINICAL DATA:  Neuro deficit with acute stroke  suspected. Slurred speech and off balance feeling with dizziness and shortness of breath since early this morning. EXAM: CT ANGIOGRAPHY HEAD AND NECK WITH AND WITHOUT CONTRAST TECHNIQUE: Multidetector CT imaging of the head and neck was performed using the standard protocol during bolus administration of intravenous contrast. Multiplanar CT image reconstructions and MIPs were obtained to evaluate the vascular anatomy. Carotid stenosis measurements (when applicable) are obtained utilizing NASCET criteria, using the distal internal carotid diameter as the denominator. RADIATION DOSE REDUCTION: This exam was performed according to the departmental dose-optimization program which includes automated exposure control, adjustment of the mA and/or kV according to patient size and/or use of iterative reconstruction technique. CONTRAST:  75mL OMNIPAQUE IOHEXOL 350 MG/ML SOLN COMPARISON:  05/05/2022 FINDINGS: CT HEAD FINDINGS Brain: Notable streak artifact partially due to head positioning. No evidence of infarct, hemorrhage, hydrocephalus, or mass. Vascular: See below Skull: Unremarkable Sinuses/Orbits: Negative Review of the MIP images confirms the above findings CTA NECK FINDINGS Aortic arch: 3 vessel branching.  No dilatation Right carotid system: Tortuosity with looping and beading of the ICA. No stenosis or dissection. Left carotid system: Abnormal tortuosity in the setting of connective tissue disease with ICA looping and fusiform dilatation. Aneurysmal ICA dilatation is unchanged with up to 11 mm diameter proximally. No superimposed dissection or stenosis. Vertebral arteries: No proximal subclavian stenosis. Highly tortuous vertebral arteries with marked tortuosity throughout the neck. No superimposed dissection or stenosis. Skeleton: Kyphotic curvature of the cervical spine with degenerative endplate spurring. Other neck: No acute finding. Upper chest: Left-sided pacer lead.  Apical emphysema. Review of the MIP images  confirms the above findings CTA HEAD FINDINGS Anterior circulation: Mild arterial tortuosity. No branch occlusion, beading, or aneurysm. Hypoplastic right A1 segment. Azygous height A2 segment. Posterior circulation: Vertebrobasilar arteries are smoothly contoured and diffusely patent. No branch occlusion, beading, or aneurysm Venous sinuses: Unremarkable Anatomic variants: None significant Review of the MIP images confirms the above findings IMPRESSION: 1. No emergent vascular finding or stenosis. 2. Chronic arterial manifestation of connective tissue disease especially affecting arteries in the neck with generalized vessel tortuosity, right ICA beading, and left ICA fusiform aneurysm formation measuring up to 11 mm in diameter. 3. Emphysema. Electronically Signed   By: Tiburcio Pea M.D.   On: 03/17/2023 06:01   CT ANGIO HEAD NECK W WO CM  Result Date: 03/17/2023 CLINICAL DATA:  Neuro deficit with acute stroke suspected. Slurred speech and off balance feeling with dizziness and shortness of breath since early this morning. EXAM: CT ANGIOGRAPHY HEAD AND NECK WITH AND WITHOUT CONTRAST TECHNIQUE: Multidetector CT imaging of the head and neck was performed using the standard protocol during bolus administration of intravenous contrast. Multiplanar CT image reconstructions and MIPs were obtained to evaluate the vascular anatomy. Carotid stenosis measurements (when applicable) are obtained utilizing NASCET criteria, using the distal internal carotid diameter as the denominator. RADIATION DOSE REDUCTION: This exam was performed according to the departmental dose-optimization program which includes automated exposure control, adjustment of the mA and/or kV according to patient size and/or use of iterative reconstruction technique. CONTRAST:  75mL OMNIPAQUE IOHEXOL 350 MG/ML SOLN COMPARISON:  05/05/2022 FINDINGS: CT HEAD FINDINGS Brain: Notable streak artifact partially due to head positioning. No evidence of infarct,  hemorrhage, hydrocephalus, or mass. Vascular: See below Skull: Unremarkable Sinuses/Orbits: Negative Review of the MIP images confirms the above findings CTA NECK FINDINGS Aortic arch: 3 vessel branching.  No dilatation  Right carotid system: Tortuosity with looping and beading of the ICA. No stenosis or dissection. Left carotid system: Abnormal tortuosity in the setting of connective tissue disease with ICA looping and fusiform dilatation. Aneurysmal ICA dilatation is unchanged with up to 11 mm diameter proximally. No superimposed dissection or stenosis. Vertebral arteries: No proximal subclavian stenosis. Highly tortuous vertebral arteries with marked tortuosity throughout the neck. No superimposed dissection or stenosis. Skeleton: Kyphotic curvature of the cervical spine with degenerative endplate spurring. Other neck: No acute finding. Upper chest: Left-sided pacer lead.  Apical emphysema. Review of the MIP images confirms the above findings CTA HEAD FINDINGS Anterior circulation: Mild arterial tortuosity. No branch occlusion, beading, or aneurysm. Hypoplastic right A1 segment. Azygous height A2 segment. Posterior circulation: Vertebrobasilar arteries are smoothly contoured and diffusely patent. No branch occlusion, beading, or aneurysm Venous sinuses: Unremarkable Anatomic variants: None significant Review of the MIP images confirms the above findings IMPRESSION: 1. No emergent vascular finding or stenosis. 2. Chronic arterial manifestation of connective tissue disease especially affecting arteries in the neck with generalized vessel tortuosity, right ICA beading, and left ICA fusiform aneurysm formation measuring up to 11 mm in diameter. 3. Emphysema. Electronically Signed   By: Tiburcio Pea M.D.   On: 03/17/2023 06:01   DG Chest Portable 1 View  Result Date: 03/17/2023 CLINICAL DATA:  Shortness of breath EXAM: PORTABLE CHEST 1 VIEW COMPARISON:  01/06/2017 FINDINGS: Postoperative heart with mitral and  aortic valve replacement. Right ventricular ICD lead. Congested appearance of vessels. There is no edema, consolidation, effusion, or pneumothorax. IMPRESSION: Vascular congestion without edema or focal infiltrate. Electronically Signed   By: Tiburcio Pea M.D.   On: 03/17/2023 05:31    Pending Labs Unresulted Labs (From admission, onward)     Start     Ordered   03/18/23 0500  CBC  Tomorrow morning,   R        03/17/23 0910   03/18/23 0500  Magnesium  Tomorrow morning,   R        03/17/23 0910   03/18/23 0500  Phosphorus  Tomorrow morning,   R        03/17/23 0910   03/18/23 0500  Heparin level (unfractionated)  Daily,   R      03/17/23 1046   03/17/23 1600  Heparin level (unfractionated)  Once-Timed,   TIMED        03/17/23 1046   03/17/23 0911  Comprehensive metabolic panel  5A & 5P,   R      03/17/23 0910   03/17/23 0910  Urine Culture (for pregnant, neutropenic or urologic patients or patients with an indwelling urinary catheter)  (Urine Culture)  Once,   R       Question:  Indication  Answer:  Altered mental status (if no other cause identified)   03/17/23 0910   03/17/23 0659  Blood culture (routine x 2)  BLOOD CULTURE X 2,   R      03/17/23 0658            Vitals/Pain Today's Vitals   03/17/23 1315 03/17/23 1330 03/17/23 1355 03/17/23 1443  BP: (!) 89/59 (!) 88/54 (!) 91/56 (!) 90/59  Pulse: 62 62 60 63  Resp: 15 16 15 14   Temp:    99.6 F (37.6 C)  TempSrc:    Oral  SpO2: 98% 97% 97% 99%  Weight:      Height:      PainSc:  Isolation Precautions No active isolations  Medications Medications  dexmedetomidine (PRECEDEX) 400 MCG/100ML (4 mcg/mL) infusion (0.4 mcg/kg/hr  70.8 kg Intravenous Rate/Dose Change 03/17/23 1421)  docusate sodium (COLACE) capsule 100 mg (has no administration in time range)  polyethylene glycol (MIRALAX / GLYCOLAX) packet 17 g (has no administration in time range)  lactated ringers infusion ( Intravenous Restarted 03/17/23 1025)   heparin ADULT infusion 100 units/mL (25000 units/233mL) (1,000 Units/hr Intravenous New Bag/Given 03/17/23 1028)  cefTRIAXone (ROCEPHIN) 2 g in sodium chloride 0.9 % 100 mL IVPB (has no administration in time range)  LORazepam (ATIVAN) tablet 1-4 mg ( Oral Not Given 03/17/23 1333)    Or  LORazepam (ATIVAN) injection 1-4 mg ( Intravenous See Alternative 03/17/23 1333)  thiamine (VITAMIN B1) tablet 100 mg (0 mg Oral Hold 03/17/23 1425)    Or  thiamine (VITAMIN B1) injection 100 mg ( Intravenous See Alternative 03/17/23 1425)  folic acid (FOLVITE) tablet 1 mg (0 mg Oral Hold 03/17/23 1425)  multivitamin with minerals tablet 1 tablet (0 tablets Oral Hold 03/17/23 1426)  ipratropium-albuterol (DUONEB) 0.5-2.5 (3) MG/3ML nebulizer solution 3 mL (3 mLs Nebulization Given 03/17/23 0437)  methylPREDNISolone sodium succinate (SOLU-MEDROL) 125 mg/2 mL injection 125 mg (125 mg Intravenous Given 03/17/23 0436)  iohexol (OMNIPAQUE) 350 MG/ML injection 75 mL (75 mLs Intravenous Contrast Given 03/17/23 0520)  furosemide (LASIX) injection 20 mg (20 mg Intravenous Given 03/17/23 0618)  lactated ringers bolus 1,000 mL (0 mLs Intravenous Stopped 03/17/23 0914)  midazolam (VERSED) injection 1 mg (1 mg Intravenous Given 03/17/23 0715)  vancomycin (VANCOCIN) IVPB 1000 mg/200 mL premix (0 mg Intravenous Stopped 03/17/23 0914)  ceFEPIme (MAXIPIME) 2 g in sodium chloride 0.9 % 100 mL IVPB (0 g Intravenous Stopped 03/17/23 0830)  potassium chloride 10 mEq in 100 mL IVPB (0 mEq Intravenous Stopped 03/17/23 1443)  heparin bolus via infusion 3,500 Units (3,500 Units Intravenous Bolus from Bag 03/17/23 1031)  lactated ringers bolus 500 mL (0 mLs Intravenous Stopped 03/17/23 1131)    Mobility walks       R Recommendations: See Admitting Provider Note  Report given to:   Additional Notes:

## 2023-03-17 NOTE — ED Notes (Addendum)
Per ICU NP, decreased Precedex gtt to 0.62mcg/kg/hr pt remains unresponsive, pt in bed with eyes closed, emitting snoring like noise, pt satting well on monitor,

## 2023-03-17 NOTE — ED Notes (Signed)
NP notified that pt has not urinated, bladder scan 812, np notified.

## 2023-03-17 NOTE — Progress Notes (Signed)
ANTICOAGULATION CONSULT NOTE - Initial Consult  Pharmacy Consult for heparin Indication: History of aortic valve replacement setting of Marfan syndrome   Allergies  Allergen Reactions   Abilify [Aripiprazole] Other (See Comments)    "Flattened affect" "couldn't do anything while taking it"    Patient Measurements: Height: 6\' 2"  (188 cm) Weight: 70.8 kg (156 lb) IBW/kg (Calculated) : 77.7 Heparin Dosing Weight: 70.8 kg  Vital Signs: Temp: 99.6 F (37.6 C) (06/06 0803) Temp Source: Rectal (06/06 0803) BP: 113/70 (06/06 0915) Pulse Rate: 89 (06/06 0900)  Labs: Recent Labs    03/17/23 0409 03/17/23 0415 03/17/23 0623 03/17/23 0656  HGB 11.4* 11.2*  --  10.9*  HCT 34.5* 33.0*  --  32.0*  PLT 212  --   --   --   APTT 35  --   --   --   LABPROT 15.5*  --   --   --   INR 1.2  --   --   --   CREATININE 1.06* 1.00  --   --   TROPONINIHS 15  --  15  --     Estimated Creatinine Clearance: 78.6 mL/min (by C-G formula based on SCr of 1 mg/dL).   Medical History: Past Medical History:  Diagnosis Date   Anticoagulant long-term use    after valve surgery for Marfan's syndrome   Anxiety    Ascending aorta dilatation (HCC)    Bipolar disorder (HCC)    Depression    Marfan's syndrome    Dr. Beverely Pace   Migraine    Mitral valve prolapse    Palpitations    Suicidal behavior    Tylenol Overdose   Varicose veins     Assessment: 47 yo female with history of aortic valve replacement setting of Marfan syndrome admitted with AMS. Patient previously on warfarin; however, does not seem to be taking based on medication dispense history (last filled 10/2022), documentation of non adherence, and subtherapeutic INR. Pharmacy consulted to dose IV heparin.   INR 1.2, Hgb 10.9, plt 212.   Goal of Therapy:  Heparin level 0.3-0.7 units/ml Monitor platelets by anticoagulation protocol: Yes   Plan:  Heparin IV bolus 3500 units x1 Heparin IV infusion 1000 units/hr  Check 6 hr heparin  level  Check heparin level and CBC daily Monitor for s/sx of bleeding   Andreas Ohm, PharmD Pharmacy Resident  03/17/2023 9:39 AM

## 2023-03-17 NOTE — ED Notes (Signed)
Bed alarm was going off in pt room. When RN entered room pt was noted to be attempting of sliding out of bed and actively pulling on IV to remove. Pt became extremely agitated towards RN when attempting to assist her back into bed and began hitting and biting RN. Team members entered room, and pt continued attempting to hit staff members and scream out. Security and [place], NP st bedside to eval. Per Tanja Port, NP RN to give ativan order that was previously held and to max precedex drip at this time to keep pt safe. Vitals obtained after med admin and pt remains stable and on monitor. Pt also placed on end tital to monitor respirations.

## 2023-03-17 NOTE — Progress Notes (Addendum)
eLink Physician-Brief Progress Note Patient Name: Melissa Hayden DOB: 12-19-1975 MRN: [id number]   Date of Service  03/17/2023  HPI/Events of Note  Patient requesting to go outside and vape.  Currently admitted with encephalopathy  eICU Interventions  Add nicotine patch   2358 -reviewed Ativan CIWA score.  Unable to order Ativan at correct dosage despite administration instructions.  Continue lorazepam order.  Patient is currently very anxious, scoring 14 and alert/oriented x 3.  Continue management per institutional CIWA protocol. Intervention Category Minor Interventions: Routine modifications to care plan (e.g. PRN medications for pain, fever)  Haziel Molner 03/17/2023, 9:52 PM

## 2023-03-17 NOTE — Progress Notes (Signed)
Sedated.  I could not get her to wake up with painful stim.  Plan Will dec dex to 0.2, have instructed staff to dc precedex if not waking up by time she is in ICU will stop dex all together.   Melissa Hayden ACNP-BC [facility] Pulmonary/Critical Care Pager # [phone] OR # [phone] if no answer

## 2023-03-17 NOTE — ED Notes (Signed)
MD promptly at bedside, new orders received.

## 2023-03-17 NOTE — ED Notes (Signed)
Echo tech at bedside who notified RN that pt needed assistance using bathroom. When RN entered room, pt was sitting up on stretcher removing pt gown and cardiac leads. RN attempted to assist pt back into bed, pt became very agitated and was grabbing at RN hands and arms refusing to release. Pt then began attempting to remove multiple IV's. Multiple care team members came to bedside and assisted pt back into bed and medication titrated as documented in [facility]. Physician at bedside assessing pt who stated to RN to hold ativan at this time and to continue with titration of precedex. Pt remains on bed attached to all vital monitor leads. Will continue to monitor and maintain safety parameters while pt remains in ED.

## 2023-03-17 NOTE — ED Provider Notes (Signed)
[place] EMERGENCY DEPARTMENT AT [facility] Provider Note   CSN: [id number] Arrival date & time: 03/17/23  0346     History  Chief Complaint  Patient presents with   Aphasia   Shortness of Breath    Melissa Hayden is a 47 y.o. female.  Patient arrives by EMS with slurred speech noticed at 1 PM when she woke up from sleeping.  Last known well at 2 AM when she went to bed.  Has noticed slurred speech, feeling off balance and dizziness.  Also with increasing shortness of breath throughout the day today.  Does have a history of asthma but takes no asthma medications.  History of Marfan syndrome status post aortic root and valve replacement on Coumadin.  She denies any chest pain.  She denies any focal weakness to her arms or her legs.  Does have slurred speech.  Had a headache earlier but none now.  Denies abdominal pain.  Denies any visual changes.  Denies any chest pain currently but does feel short of breath.  No productive cough or fever.  No leg pain or leg swelling.  The history is provided by the patient and the EMS personnel. The history is limited by the condition of the patient.  Shortness of Breath Associated symptoms: headaches   Associated symptoms: no abdominal pain, no chest pain, no fever, no rash and no vomiting        Home Medications Prior to Admission medications   Medication Sig Start Date End Date Taking? Authorizing Provider  acetaminophen (TYLENOL) 650 MG CR tablet Take 1,300 mg by mouth every 8 (eight) hours as needed (for headache/pain relief).    [provider]  carvedilol (COREG) 6.25 MG tablet TAKE 1 TABLET BY MOUTH TWICE A DAY 06/14/18   Rollene Rotunda, MD  citalopram (CELEXA) 20 MG tablet Take one tab daily 07/20/22   Arfeen, Phillips Grout, MD  clonazePAM (KLONOPIN) 0.5 MG disintegrating tablet Take 1 tablet (0.5 mg total) by mouth 3 (three) times daily as needed for seizure. Patient not taking: Reported on 04/21/2022 12/29/21   Janeece Agee,  NP  divalproex (DEPAKOTE ER) 500 MG 24 hr tablet Take 2 tablets (1,000 mg total) by mouth at bedtime. 07/20/22   Arfeen, Phillips Grout, MD  EPINEPHrine 0.3 mg/0.3 mL IJ SOAJ injection USE AS DIRECTED WHEN NEEDED 03/31/21   Janeece Agee, NP  hydrOXYzine (ATARAX) 10 MG tablet Take 1 tablet (10 mg total) by mouth 3 (three) times daily as needed. Patient not taking: Reported on 11/11/2021 09/08/21   Janeece Agee, NP  Multiple Vitamin (MULTIVITAMIN WITH MINERALS) TABS tablet Take 1 tablet by mouth daily. Centrum Ultra Women's Oral    [provider]  OLANZapine (ZYPREXA) 20 MG tablet TAKE 1 TABLET BY MOUTH EVERYDAY AT BEDTIME 07/20/22   Arfeen, Phillips Grout, MD  rizatriptan (MAXALT) 10 MG tablet TAKE 1 TABLET BY MOUTH AS NEEDED FOR MIGRAINE. MAY REPEAT IN 2 HOURS IF NEEDED 01/05/22   Janeece Agee, NP  sulfamethoxazole-trimethoprim (BACTRIM DS) 800-160 MG tablet Take 1 tablet by mouth 2 (two) times daily. Patient not taking: Reported on 11/11/2021 08/06/21   Janeece Agee, NP  traZODone (DESYREL) 150 MG tablet Take 1 tablet (150 mg total) by mouth at bedtime. TAKE 1 TABLET BY MOUTH EVERYDAY AT BEDTIME 07/20/22   Arfeen, Phillips Grout, MD  vortioxetine HBr (TRINTELLIX) 10 MG TABS tablet Take 1 tablet (10 mg total) by mouth daily. Patient not taking: Reported on 04/21/2022 04/12/22   Arfeen,  Phillips Grout, MD  warfarin (COUMADIN) 5 MG tablet TAKE 1 TABLET DAILY ON SUN,MON,WED,FRI,SAT. TAKES 7.5 MG ON TU AND THU 04/12/22   Janeece Agee, NP      Allergies    Abilify [aripiprazole]    Review of Systems   Review of Systems  Constitutional:  Negative for activity change, appetite change and fever.  HENT:  Negative for congestion and rhinorrhea.   Respiratory:  Positive for shortness of breath.   Cardiovascular:  Negative for chest pain and leg swelling.  Gastrointestinal:  Negative for abdominal pain, nausea and vomiting.  Genitourinary:  Negative for dysuria and hematuria.  Musculoskeletal:  Negative for arthralgias  and myalgias.  Skin:  Negative for rash.  Neurological:  Positive for dizziness, speech difficulty, weakness, light-headedness and headaches.   all other systems are negative except as noted in the HPI and PMH.    Physical Exam Updated Vital Signs BP 117/68 (BP Location: Right Arm)   Pulse (!) 51   Temp 98 F (36.7 C) (Oral)   Resp (!) 24   Ht 6\' 2"  (1.88 m)   Wt 70.8 kg   SpO2 98%   BMI 20.03 kg/m  Physical Exam Vitals and nursing note reviewed.  Constitutional:      General: She is not in acute distress.    Appearance: She is well-developed.     Comments: Dyspneic with conversation Holding Head to the left, tachypneic, no tongue swelling or lip swelling.  HENT:     Head: Normocephalic and atraumatic.     Mouth/Throat:     Pharynx: No oropharyngeal exudate.  Eyes:     Conjunctiva/sclera: Conjunctivae normal.     Pupils: Pupils are equal, round, and reactive to light.  Neck:     Comments: No meningismus. Cardiovascular:     Rate and Rhythm: Normal rate and regular rhythm.     Heart sounds: Normal heart sounds. No murmur heard. Pulmonary:     Effort: Respiratory distress present.     Breath sounds: Wheezing present.     Comments: Diminished breath sounds, scattered expiratory wheeze Upper respiratory congestion without frank stridor. Abdominal:     Palpations: Abdomen is soft.     Tenderness: There is no abdominal tenderness. There is no guarding or rebound.  Musculoskeletal:        General: No tenderness. Normal range of motion.     Cervical back: Normal range of motion and neck supple.  Skin:    General: Skin is warm.  Neurological:     Mental Status: She is alert and oriented to person, place, and time.     Motor: No abnormal muscle tone.     Coordination: Coordination normal.     Comments: Dysarthric speech, no facial droop, tongue is midline, 5/5 strength throughout, no drift of arms or legs.  Psychiatric:        Behavior: Behavior normal.     ED  Results / Procedures / Treatments   Labs (all labs ordered are listed, but only abnormal results are displayed) Labs Reviewed  PROTIME-INR - Abnormal; Notable for the following components:      Result Value   Prothrombin Time 15.5 (*)    All other components within normal limits  CBC - Abnormal; Notable for the following components:   Hemoglobin 11.4 (*)    HCT 34.5 (*)    RDW 16.2 (*)    All other components within normal limits  COMPREHENSIVE METABOLIC PANEL - Abnormal; Notable for the following components:  CO2 13 (*)    Glucose, Bld 101 (*)    Creatinine, Ser 1.06 (*)    Calcium 7.9 (*)    All other components within normal limits  BRAIN NATRIURETIC PEPTIDE - Abnormal; Notable for the following components:   B Natriuretic Peptide 198.2 (*)    All other components within normal limits  I-STAT CHEM 8, ED - Abnormal; Notable for the following components:   Chloride 124 (*)    Glucose, Bld 106 (*)    Calcium, Ion 0.82 (*)    TCO2 16 (*)    Hemoglobin 11.2 (*)    HCT 33.0 (*)    All other components within normal limits  CBG MONITORING, ED - Abnormal; Notable for the following components:   Glucose-Capillary 121 (*)    All other components within normal limits  I-STAT ARTERIAL BLOOD GAS, ED - Abnormal; Notable for the following components:   pCO2 arterial 20.4 (*)    pO2, Arterial 146 (*)    Bicarbonate 12.6 (*)    TCO2 13 (*)    Acid-base deficit 10.0 (*)    Calcium, Ion 0.89 (*)    HCT 32.0 (*)    Hemoglobin 10.9 (*)    All other components within normal limits  CBG MONITORING, ED - Abnormal; Notable for the following components:   Glucose-Capillary 128 (*)    All other components within normal limits  CULTURE, BLOOD (ROUTINE X 2)  CULTURE, BLOOD (ROUTINE X 2)  APTT  DIFFERENTIAL  ETHANOL  LACTIC ACID, PLASMA  LACTIC ACID, PLASMA  OSMOLALITY  URINALYSIS, ROUTINE W REFLEX MICROSCOPIC  RAPID URINE DRUG SCREEN, HOSP PERFORMED  I-STAT BETA HCG BLOOD, ED ([facility], [facility],  [facility] ONLY)  TROPONIN I (HIGH SENSITIVITY)  TROPONIN I (HIGH SENSITIVITY)    EKG EKG Interpretation  Date/Time:  Thursday March 17 2023 04:32:43 EDT Ventricular Rate:  103 PR Interval:  162 QRS Duration: 87 QT Interval:  368 QTC Calculation: 430 R Axis:   87 Text Interpretation: Sinus tachycardia Paired ventricular premature complexes Probable left atrial enlargement Left ventricular hypertrophy No significant change was found Confirmed by Glynn Octave [phone]) on 03/17/2023 4:50:06 AM  Radiology CT Angio Chest/Abd/Pel for Dissection W and/or Wo Contrast  Result Date: 03/17/2023 CLINICAL DATA:  Suspected acute aortic syndrome, slurred speech EXAM: CT ANGIOGRAPHY CHEST, ABDOMEN AND PELVIS TECHNIQUE: Multidetector CT imaging through the chest, abdomen and pelvis was performed using the standard protocol during bolus administration of intravenous contrast. Multiplanar reconstructed images and MIPs were obtained and reviewed to evaluate the vascular anatomy. RADIATION DOSE REDUCTION: This exam was performed according to the departmental dose-optimization program which includes automated exposure control, adjustment of the mA and/or kV according to patient size and/or use of iterative reconstruction technique. CONTRAST:  75mL OMNIPAQUE IOHEXOL 350 MG/ML SOLN COMPARISON:  12/06/2016 FINDINGS: CTA CHEST FINDINGS Cardiovascular: Left subclavian AICD extends towards the RV apex. The SVC is patent. Borderline cardiomegaly with left atrial enlargement. No pericardial effusion. Dilated central pulmonary arteries suggesting pulmonary arterial hypertension. Satisfactory opacification of pulmonary arteries noted, and there is no evidence of pulmonary emboli. Post MVR. Post AVR. Mild scattered coronary calcifications. Adequate contrast opacification of the thoracic aorta with no evidence of dissection, aneurysm, or stenosis. There is classic 3-vessel brachiocephalic arch anatomy without proximal stenosis. Minimal  scattered calcified plaque in the arch and descending segment. Mediastinum/Nodes: No mass or adenopathy. Lungs/Pleura: No pleural effusion. No pneumothorax. Pulmonary emphysema. Dependent atelectasis posteriorly in both lungs. Musculoskeletal: Sternotomy wires. Benign T5 vertebral body hemangioma.  No acute findings. Review of the MIP images confirms the above findings. CTA ABDOMEN AND PELVIS FINDINGS VASCULAR Aorta: Borderline 3.1 cm dilatation of the suprarenal aorta, with scattered calcified plaque, no infrarenal aneurysm. No dissection or stenosis. Celiac: Patent without evidence of aneurysm, dissection, vasculitis or significant stenosis. SMA: Patent without evidence of aneurysm, dissection, vasculitis or significant stenosis. Renals: Both renal arteries are patent without evidence of aneurysm, dissection, vasculitis, fibromuscular dysplasia or significant stenosis. IMA: Patent without evidence of aneurysm, dissection, vasculitis or significant stenosis. Inflow: Mild scattered atheromatous calcified plaque in bilateral common iliac arteries. No aneurysm, dissection, or stenosis. Veins: No obvious venous abnormality within the limitations of this arterial phase study. Review of the MIP images confirms the above findings. NON-VASCULAR Hepatobiliary: No focal liver lesion or biliary ductal dilatation. The gallbladder is distended, without calcified gallstones. Pancreas: Unremarkable. No pancreatic ductal dilatation or surrounding inflammatory changes. Spleen: Normal in size without focal abnormality. Adrenals/Urinary Tract: No adrenal mass. Small wedge-shaped region of hypoenhancement in the upper pole right kidney, new since previous. No hydronephrosis. Urinary bladder is distended. Stomach/Bowel: Stomach is partially distended, unremarkable. Small bowel decompressed. Appendix not discretely identified. No pericecal inflammatory/edematous change. The colon is partially distended by gas and fecal material without  acute finding. Lymphatic: No abdominal or pelvic adenopathy. Reproductive: Uterus and bilateral adnexa are unremarkable. Other: No ascites.  No free air. Musculoskeletal: Large sacral Tarlov cysts. Mild lumbar levoscoliosis with degenerative disc disease L2-3. Review of the MIP images confirms the above findings. IMPRESSION: 1. No evidence of aortic dissection or other acute vascular pathology. 2. Small wedge-shaped region of hypoenhancement in the upper pole right kidney, new since previous. This could represent a small infarct or pyelonephritis. 3. 3.1 cm suprarenal abdominal aortic aneurysm. Recommend follow-up ultrasound every 3 years all all all all. This recommendation follows ACR consensus guidelines: White Paper of the ACR Incidental Findings Committee II on Vascular Findings. J Am Coll Radiol 2013; 14:782-956. 4. Aortic Atherosclerosis (ICD10-I70.0) and Emphysema (ICD10-J43.9). Electronically Signed   By: Corlis Leak M.D.   On: 03/17/2023 06:27   CT HEAD WO CONTRAST  Result Date: 03/17/2023 CLINICAL DATA:  Neuro deficit with acute stroke suspected. Slurred speech and off balance feeling with dizziness and shortness of breath since early this morning. EXAM: CT ANGIOGRAPHY HEAD AND NECK WITH AND WITHOUT CONTRAST TECHNIQUE: Multidetector CT imaging of the head and neck was performed using the standard protocol during bolus administration of intravenous contrast. Multiplanar CT image reconstructions and MIPs were obtained to evaluate the vascular anatomy. Carotid stenosis measurements (when applicable) are obtained utilizing NASCET criteria, using the distal internal carotid diameter as the denominator. RADIATION DOSE REDUCTION: This exam was performed according to the departmental dose-optimization program which includes automated exposure control, adjustment of the mA and/or kV according to patient size and/or use of iterative reconstruction technique. CONTRAST:  75mL OMNIPAQUE IOHEXOL 350 MG/ML SOLN  COMPARISON:  05/05/2022 FINDINGS: CT HEAD FINDINGS Brain: Notable streak artifact partially due to head positioning. No evidence of infarct, hemorrhage, hydrocephalus, or mass. Vascular: See below Skull: Unremarkable Sinuses/Orbits: Negative Review of the MIP images confirms the above findings CTA NECK FINDINGS Aortic arch: 3 vessel branching.  No dilatation Right carotid system: Tortuosity with looping and beading of the ICA. No stenosis or dissection. Left carotid system: Abnormal tortuosity in the setting of connective tissue disease with ICA looping and fusiform dilatation. Aneurysmal ICA dilatation is unchanged with up to 11 mm diameter proximally. No superimposed dissection or stenosis. Vertebral arteries: No  proximal subclavian stenosis. Highly tortuous vertebral arteries with marked tortuosity throughout the neck. No superimposed dissection or stenosis. Skeleton: Kyphotic curvature of the cervical spine with degenerative endplate spurring. Other neck: No acute finding. Upper chest: Left-sided pacer lead.  Apical emphysema. Review of the MIP images confirms the above findings CTA HEAD FINDINGS Anterior circulation: Mild arterial tortuosity. No branch occlusion, beading, or aneurysm. Hypoplastic right A1 segment. Azygous height A2 segment. Posterior circulation: Vertebrobasilar arteries are smoothly contoured and diffusely patent. No branch occlusion, beading, or aneurysm Venous sinuses: Unremarkable Anatomic variants: None significant Review of the MIP images confirms the above findings IMPRESSION: 1. No emergent vascular finding or stenosis. 2. Chronic arterial manifestation of connective tissue disease especially affecting arteries in the neck with generalized vessel tortuosity, right ICA beading, and left ICA fusiform aneurysm formation measuring up to 11 mm in diameter. 3. Emphysema. Electronically Signed   By: Tiburcio Pea M.D.   On: 03/17/2023 06:01   CT ANGIO HEAD NECK W WO CM  Result Date:  03/17/2023 CLINICAL DATA:  Neuro deficit with acute stroke suspected. Slurred speech and off balance feeling with dizziness and shortness of breath since early this morning. EXAM: CT ANGIOGRAPHY HEAD AND NECK WITH AND WITHOUT CONTRAST TECHNIQUE: Multidetector CT imaging of the head and neck was performed using the standard protocol during bolus administration of intravenous contrast. Multiplanar CT image reconstructions and MIPs were obtained to evaluate the vascular anatomy. Carotid stenosis measurements (when applicable) are obtained utilizing NASCET criteria, using the distal internal carotid diameter as the denominator. RADIATION DOSE REDUCTION: This exam was performed according to the departmental dose-optimization program which includes automated exposure control, adjustment of the mA and/or kV according to patient size and/or use of iterative reconstruction technique. CONTRAST:  75mL OMNIPAQUE IOHEXOL 350 MG/ML SOLN COMPARISON:  05/05/2022 FINDINGS: CT HEAD FINDINGS Brain: Notable streak artifact partially due to head positioning. No evidence of infarct, hemorrhage, hydrocephalus, or mass. Vascular: See below Skull: Unremarkable Sinuses/Orbits: Negative Review of the MIP images confirms the above findings CTA NECK FINDINGS Aortic arch: 3 vessel branching.  No dilatation Right carotid system: Tortuosity with looping and beading of the ICA. No stenosis or dissection. Left carotid system: Abnormal tortuosity in the setting of connective tissue disease with ICA looping and fusiform dilatation. Aneurysmal ICA dilatation is unchanged with up to 11 mm diameter proximally. No superimposed dissection or stenosis. Vertebral arteries: No proximal subclavian stenosis. Highly tortuous vertebral arteries with marked tortuosity throughout the neck. No superimposed dissection or stenosis. Skeleton: Kyphotic curvature of the cervical spine with degenerative endplate spurring. Other neck: No acute finding. Upper chest:  Left-sided pacer lead.  Apical emphysema. Review of the MIP images confirms the above findings CTA HEAD FINDINGS Anterior circulation: Mild arterial tortuosity. No branch occlusion, beading, or aneurysm. Hypoplastic right A1 segment. Azygous height A2 segment. Posterior circulation: Vertebrobasilar arteries are smoothly contoured and diffusely patent. No branch occlusion, beading, or aneurysm Venous sinuses: Unremarkable Anatomic variants: None significant Review of the MIP images confirms the above findings IMPRESSION: 1. No emergent vascular finding or stenosis. 2. Chronic arterial manifestation of connective tissue disease especially affecting arteries in the neck with generalized vessel tortuosity, right ICA beading, and left ICA fusiform aneurysm formation measuring up to 11 mm in diameter. 3. Emphysema. Electronically Signed   By: Tiburcio Pea M.D.   On: 03/17/2023 06:01   DG Chest Portable 1 View  Result Date: 03/17/2023 CLINICAL DATA:  Shortness of breath EXAM: PORTABLE CHEST 1 VIEW COMPARISON:  01/06/2017  FINDINGS: Postoperative heart with mitral and aortic valve replacement. Right ventricular ICD lead. Congested appearance of vessels. There is no edema, consolidation, effusion, or pneumothorax. IMPRESSION: Vascular congestion without edema or focal infiltrate. Electronically Signed   By: Tiburcio Pea M.D.   On: 03/17/2023 05:31    Procedures .Critical Care  Performed by: Glynn Octave, MD Authorized by: Glynn Octave, MD   Critical care provider statement:    Critical care time (minutes):  60   Critical care time was exclusive of:  Separately billable procedures and treating other patients   Critical care was necessary to treat or prevent imminent or life-threatening deterioration of the following conditions:  Respiratory failure and CNS failure or compromise   Critical care was time spent personally by me on the following activities:  Development of treatment plan with patient or  surrogate, discussions with consultants, evaluation of patient's response to treatment, examination of patient, ordering and review of laboratory studies, ordering and review of radiographic studies, ordering and performing treatments and interventions, pulse oximetry, re-evaluation of patient's condition, review of old charts, blood draw for specimens and obtaining history from patient or surrogate   I assumed direction of critical care for this patient from another provider in my specialty: no     Care discussed with: admitting provider       Medications Ordered in ED Medications  ipratropium-albuterol (DUONEB) 0.5-2.5 (3) MG/3ML nebulizer solution 3 mL (3 mLs Nebulization Given 03/17/23 0437)  methylPREDNISolone sodium succinate (SOLU-MEDROL) 125 mg/2 mL injection 125 mg (125 mg Intravenous Given 03/17/23 0436)    ED Course/ Medical Decision Making/ A&P                             Medical Decision Making Amount and/or Complexity of Data Reviewed Independent Historian: EMS Labs: ordered. Decision-making details documented in ED Course. Radiology: ordered and independent interpretation performed. Decision-making details documented in ED Course. ECG/medicine tests: ordered and independent interpretation performed. Decision-making details documented in ED Course.  Risk Prescription drug management.  Slurred speech, last normal approximately 24 hours ago.  Code stroke not activated due to delay in presentation.  Diminished breath sounds with increased shortness of breath on arrival.  Chest x-ray on arrival shows evidence of vascular congestion and she is given a dose of IV Lasix.  She is also given bronchodilators and steroids.  CT head is negative for hemorrhage.  Results reviewed interpreted by me CTA shows no large vessel occlusion but does show tortuous vessels consistent with her Marfan's disease as well as left carotid artery aneurysm. Unclear whether patient can receive MRI due to  her pacemaker.  Approximately 6:30 AM, patient with acute change in mental status.  She is confused, agitated, trying to get out of bed, pulling off monitoring leads and IVs.  At the redirect to bed multiple times and replaced IVs.  ABG is obtained to evaluate for CO2 retention and patient patient placed on BiPAP while this is in process.  ABG shows Overcompensated metabolic acidosis.  No CO2 retention. BiPAP was removed.  Will initiate Precedex infusion.  Does not appear to be withdrawing from any substance.  She is not tachycardic.  unClear etiology of her metabolic acidosis.  She is empirically started on broad-spectrum antibiotics after cultures are obtained. Urinalysis and U tox are pending.  Chest x-ray negative for pneumonia but does show edema.  CT of abdomen and pelvis does show aortic aneurysm and stable surgical repair of  her thoracic aneurysm and aortic valve.  Questionable area of pyelonephritis. Mentation and agitation improving on Precedex and Versed.  Maintaining airway at this time. Remains afebrile.  May require intubation if her agitation worsens and she is not cooperative.  Discussed with critical care who will evaluate.        Final Clinical Impression(s) / ED Diagnoses Final diagnoses:  None    Rx / DC Orders ED Discharge Orders     None         Richey Doolittle, Jeannett Senior, MD 03/17/23 [phone]

## 2023-03-17 NOTE — ED Notes (Signed)
In and out cath complete for urine sample, RN brooke at bedside to ensure and observe for sterile technique.  Pt tolerated well, sample obtained on first attempt and sent to lab.

## 2023-03-17 NOTE — ED Notes (Signed)
Change in condition noted, informed MD Rancour.

## 2023-03-17 NOTE — ED Notes (Addendum)
Pt in bed with eyes closed, pt emitting a snoring like noise, pt not arousable for oral meds, icu NP notified, decreased precedex gtt

## 2023-03-17 NOTE — ED Notes (Signed)
Pt appears to have some swelling above her L forearm IV, suspected infiltration, stopped infusion, pt had cefepime and precedex infusing, IV no longer has blood return, consulted with pharmacy, pt has aprox one inch area of infiltration that is slightly firm.  Per pharmacy hot pack to assist with swelling.  D/c iv, cath intact, hot pack placed.

## 2023-03-17 NOTE — ED Notes (Signed)
Patient was placed on NIV due to tachypnea. Once on NIV on minimal settings, patient was pulling volumes 1800-2178ml. Took POC ABG values are below. Patient was taken off NIV. MD Rancour made aware of patient clinical presentation, and ABG values. Patient is stable at this time.      Latest Reference Range & Units 03/17/23 06:56  Sample type  ARTERIAL  pH, Arterial 7.35 - 7.45  7.398  pCO2 arterial 32 - 48 mmHg 20.4 (L)  pO2, Arterial 83 - 108 mmHg 146 (H)  TCO2 22 - 32 mmol/L 13 (L)  Acid-base deficit 0.0 - 2.0 mmol/L 10.0 (H)  Bicarbonate 20.0 - 28.0 mmol/L 12.6 (L)  O2 Saturation % 99  Patient temperature  98.0 F    Randy Whitener L. Katrinka Blazing, BS, RRT-ACCS, RCP

## 2023-03-18 LAB — HEPARIN LEVEL (UNFRACTIONATED)
Heparin Unfractionated: 0.17 IU/mL — ABNORMAL LOW (ref 0.30–0.70)
Heparin Unfractionated: 0.23 IU/mL — ABNORMAL LOW (ref 0.30–0.70)
Heparin Unfractionated: 0.33 IU/mL (ref 0.30–0.70)

## 2023-03-18 LAB — CBC
HCT: 31.3 % — ABNORMAL LOW (ref 36.0–46.0)
Hemoglobin: 9.8 g/dL — ABNORMAL LOW (ref 12.0–15.0)
MCH: 27.4 pg (ref 26.0–34.0)
MCHC: 31.3 g/dL (ref 30.0–36.0)
MCV: 87.4 fL (ref 80.0–100.0)
Platelets: 198 10*3/uL (ref 150–400)
RBC: 3.58 MIL/uL — ABNORMAL LOW (ref 3.87–5.11)
RDW: 16.3 % — ABNORMAL HIGH (ref 11.5–15.5)
WBC: 8 10*3/uL (ref 4.0–10.5)
nRBC: 0.3 % — ABNORMAL HIGH (ref 0.0–0.2)

## 2023-03-18 LAB — MAGNESIUM: Magnesium: 2.1 mg/dL (ref 1.7–2.4)

## 2023-03-18 LAB — URINE CULTURE: Culture: NO GROWTH

## 2023-03-18 LAB — CULTURE, BLOOD (ROUTINE X 2): Culture: NO GROWTH

## 2023-03-18 LAB — PHOSPHORUS: Phosphorus: 2.7 mg/dL (ref 2.5–4.6)

## 2023-03-18 LAB — GLUCOSE, CAPILLARY
Glucose-Capillary: 111 mg/dL — ABNORMAL HIGH (ref 70–99)
Glucose-Capillary: 62 mg/dL — ABNORMAL LOW (ref 70–99)
Glucose-Capillary: 69 mg/dL — ABNORMAL LOW (ref 70–99)
Glucose-Capillary: 73 mg/dL (ref 70–99)
Glucose-Capillary: 74 mg/dL (ref 70–99)
Glucose-Capillary: 85 mg/dL (ref 70–99)
Glucose-Capillary: 86 mg/dL (ref 70–99)

## 2023-03-18 MED ORDER — DEXTROSE 50 % IV SOLN
12.5000 g | INTRAVENOUS | Status: AC
  Administered 2023-03-18: 12.5 g via INTRAVENOUS

## 2023-03-18 MED ORDER — DEXTROSE 50 % IV SOLN
12.5000 g | INTRAVENOUS | Status: AC
  Administered 2023-03-18: 12.5 g via INTRAVENOUS
  Filled 2023-03-18: qty 50

## 2023-03-18 MED ORDER — HEPARIN BOLUS VIA INFUSION
2000.0000 [IU] | Freq: Once | INTRAVENOUS | Status: AC
  Administered 2023-03-18: 2000 [IU] via INTRAVENOUS
  Filled 2023-03-18: qty 2000

## 2023-03-18 NOTE — Progress Notes (Signed)
ANTICOAGULATION CONSULT NOTE - Follow-up Consult  Pharmacy Consult for heparin Indication: History of aortic valve replacement setting of Marfan syndrome   Allergies  Allergen Reactions   Abilify [Aripiprazole] Other (See Comments)    "Flattened affect" "couldn't do anything while taking it"    Patient Measurements: Height: 6\' 2"  (188 cm) Weight: 70.7 kg (155 lb 13.8 oz) IBW/kg (Calculated) : 77.7 Heparin Dosing Weight: 70 kg  Vital Signs: Temp: 99.4 F (37.4 C) (06/07 1538) Temp Source: Axillary (06/07 1538) BP: 116/67 (06/07 1800) Pulse Rate: 70 (06/07 1800)  Labs: Recent Labs    03/17/23 0409 03/17/23 0415 03/17/23 1610 03/17/23 0656 03/17/23 0912 03/17/23 1651 03/17/23 1651 03/18/23 0044 03/18/23 0946 03/18/23 1732  HGB 11.4* 11.2*  --  10.9*  --   --   --  9.8*  --   --   HCT 34.5* 33.0*  --  32.0*  --   --   --  31.3*  --   --   PLT 212  --   --   --   --   --   --  198  --   --   APTT 35  --   --   --   --   --   --   --   --   --   LABPROT 15.5*  --   --   --   --   --   --   --   --   --   INR 1.2  --   --   --   --   --   --   --   --   --   HEPARINUNFRC  --   --   --   --   --  0.25*   < > 0.17* 0.23* 0.33  CREATININE 1.06* 1.00  --   --  1.14* 1.08*  --   --   --   --   TROPONINIHS 15  --  15  --   --   --   --   --   --   --    < > = values in this interval not displayed.    Estimated Creatinine Clearance: 72.6 mL/min (A) (by C-G formula based on SCr of 1.08 mg/dL (H)).   Medical History: Past Medical History:  Diagnosis Date   Anticoagulant long-term use    after valve surgery for Marfan's syndrome   Anxiety    Ascending aorta dilatation (HCC)    Bipolar disorder (HCC)    Depression    Marfan's syndrome    Dr. Beverely Pace   Migraine    Mitral valve prolapse    Palpitations    Suicidal behavior    Tylenol Overdose   Varicose veins     Assessment: 47 yo female with history of aortic valve replacement setting of Marfan syndrome admitted  with AMS. Patient previously on warfarin; however, does not seem to be taking based on medication dispense history (last filled 10/2022), documentation of non adherence, and subtherapeutic INR. Pharmacy consulted to dose IV heparin.   Heparin level 0.33 is therapeutic on 1450 units/hr. Will increase slightly to keep in goal.   Goal of Therapy:  Heparin level 0.3-0.7 units/ml Monitor platelets by anticoagulation protocol: Yes   Plan:  Increase heparin to 1500 units/hr Check heparin level and CBC daily Monitor for s/sx of bleeding   Alphia Moh, PharmD, BCPS, BCCP Clinical Pharmacist  Please check AMION for all [facility]  phone numbers After 10:00 PM, call Main Pharmacy [phone]

## 2023-03-18 NOTE — Progress Notes (Signed)
eLink Physician-Brief Progress Note Patient Name: Melissa Hayden DOB: 1975-11-03 MRN: [id number]   Date of Service  03/18/2023  HPI/Events of Note  high fall risk attempting to get out of bed  eICU Interventions  Posey belt   0023 -blood glucose consistently 60s, running LR at 75.  Add on D10 at 20 cc/h.  Discontinue every 4 hours EKGs  0342 - continuing to attempts to get OOB even with posey; hypoglycemic CBG 63 on D10 @20  -- Add on LFTs, increase D20 to 40. Add Precedex  Intervention Category Minor Interventions: Agitation / anxiety - evaluation and management  Ranee Peasley 03/18/2023, 11:04 PM

## 2023-03-18 NOTE — Progress Notes (Signed)
ANTICOAGULATION CONSULT NOTE - Follow Up Consult  Pharmacy Consult for heparin Indication:  AVR  Labs: Recent Labs    03/17/23 0409 03/17/23 0415 03/17/23 0623 03/17/23 0656 03/17/23 0912 03/17/23 1651 03/18/23 0044  HGB 11.4* 11.2*  --  10.9*  --   --  9.8*  HCT 34.5* 33.0*  --  32.0*  --   --  31.3*  PLT 212  --   --   --   --   --  198  APTT 35  --   --   --   --   --   --   LABPROT 15.5*  --   --   --   --   --   --   INR 1.2  --   --   --   --   --   --   HEPARINUNFRC  --   --   --   --   --  0.25* 0.17*  CREATININE 1.06* 1.00  --   --  1.14* 1.08*  --   TROPONINIHS 15  --  15  --   --   --   --     Assessment: 47yo female subtherapeutic on heparin with lower heparin level despite increased rate; no infusion issues or signs of bleeding per RN.  Goal of Therapy:  Heparin level 0.3-0.7 units/ml   Plan:  2000 units heparin bolus. Increase heparin infusion by 2-3 units/kg/hr to 1300 units/hr. Check level in 6 hours.   Vernard Gambles, PharmD, BCPS 03/18/2023 1:24 AM

## 2023-03-18 NOTE — Progress Notes (Signed)
ANTICOAGULATION CONSULT NOTE - Follow-up Consult  Pharmacy Consult for heparin Indication: History of aortic valve replacement setting of Marfan syndrome   Allergies  Allergen Reactions   Abilify [Aripiprazole] Other (See Comments)    "Flattened affect" "couldn't do anything while taking it"    Patient Measurements: Height: 6\' 2"  (188 cm) Weight: 70.7 kg (155 lb 13.8 oz) IBW/kg (Calculated) : 77.7 Heparin Dosing Weight: 70 kg  Vital Signs: Temp: 97.6 F (36.4 C) (06/07 0747) Temp Source: Axillary (06/07 0747) BP: 102/58 (06/07 1000) Pulse Rate: 69 (06/07 1000)  Labs: Recent Labs    03/17/23 0409 03/17/23 0415 03/17/23 0623 03/17/23 0656 03/17/23 0912 03/17/23 1651 03/18/23 0044 03/18/23 0946  HGB 11.4* 11.2*  --  10.9*  --   --  9.8*  --   HCT 34.5* 33.0*  --  32.0*  --   --  31.3*  --   PLT 212  --   --   --   --   --  198  --   APTT 35  --   --   --   --   --   --   --   LABPROT 15.5*  --   --   --   --   --   --   --   INR 1.2  --   --   --   --   --   --   --   HEPARINUNFRC  --   --   --   --   --  0.25* 0.17* 0.23*  CREATININE 1.06* 1.00  --   --  1.14* 1.08*  --   --   TROPONINIHS 15  --  15  --   --   --   --   --      Estimated Creatinine Clearance: 72.6 mL/min (A) (by C-G formula based on SCr of 1.08 mg/dL (H)).   Medical History: Past Medical History:  Diagnosis Date   Anticoagulant long-term use    after valve surgery for Marfan's syndrome   Anxiety    Ascending aorta dilatation (HCC)    Bipolar disorder (HCC)    Depression    Marfan's syndrome    Dr. Beverely Pace   Migraine    Mitral valve prolapse    Palpitations    Suicidal behavior    Tylenol Overdose   Varicose veins     Assessment: 47 yo female with history of aortic valve replacement setting of Marfan syndrome admitted with AMS. Patient previously on warfarin; however, does not seem to be taking based on medication dispense history (last filled 10/2022), documentation of non adherence,  and subtherapeutic INR. Pharmacy consulted to dose IV heparin.   Heparin level 0.23 units/mL (subtherapeutic) on heparin 1300 units/hr. CBC stable with no signs of bleeding.  Goal of Therapy:  Heparin level 0.3-0.7 units/ml Monitor platelets by anticoagulation protocol: Yes   Plan:  Bolus heparin 2000 units IV once then increase to 1450 units/hr Recheck 6 hr heparin level  Check heparin level and CBC daily Monitor for s/sx of bleeding   Eldridge Scot, PharmD Clinical Pharmacist Please refer to [facility] for [facility] numbers 03/18/2023 10:52 AM

## 2023-03-18 NOTE — Progress Notes (Signed)
NAME:  Melissa Hayden, MRN:  [id number], DOB:  01/31/1976, LOS: 1 ADMISSION DATE:  03/17/2023, CONSULTATION DATE:  6/6 REFERRING MD:  floyd, CHIEF COMPLAINT: acute metabolic encephalopathy and metabolic acidosis    History of Present Illness:  47 year old chronically ill-appearing female patient is brought in by EMS on 6/6 for slurred speech confusion, and dizziness.  Last seen normal earlier that evening.  No fever cough rashes chest pain.  Reported headache earlier in the evening.  Was able to provide initial history. Was added in as a code stroke, became progressively confused while in the emergency room CT head was negative for acute findings, there were arterial manifestations of connective tissue disease with right ICA beading and left ICA fusiform aneurysm, the CT chest was negative for aortic dissection there is a small wedge-shaped region in the upper pole of the right kidney there was a 3.1 cm suprarenal aortic aneurysm.  Initial glucose 121, INR 1.2 white blood cell count 9.4 she had a normal anion gap metabolic acidosis ethanol level was less than 10.  Because of her progressive confusion she was placed on Precedex infusion critical care asked to admit  Pertinent  Medical History  Asthma, Marfan syndrome, prior aortic root and valve replacement.  On chronic Coumadin. Bipolar disease with prior suicidal attempt Current home medication list includes: Celexa, Coreg, Klonopin, Depakote ER, Zyprexa, rizatriptan for headache, trazodone and warfarin Significant Hospital Events: Including procedures, antibiotic start and stop dates in addition to other pertinent events   6/6 admitted with acute mental status change  Interim History / Subjective:  Noted to be awake and alert and interactive during the night but did get agitated and required sedation Currently not interactive  Objective   Blood pressure (!) 93/56, pulse 71, temperature 97.6 F (36.4 C), temperature source Axillary, resp. rate  14, height 6\' 2"  (1.88 m), weight 70.7 kg, SpO2 93 %.        Intake/Output Summary (Last 24 hours) at 03/18/2023 0919 Last data filed at 03/18/2023 4098 Gross per 24 hour  Intake 2909.09 ml  Output 1375 ml  Net 1534.09 ml   Filed Weights   03/17/23 0354 03/18/23 0500  Weight: 70.8 kg 70.7 kg    Examination: General: Chronically ill-appearing  HENT: Normocephalic, atraumatic, moist oral mucosa Lungs: Clear breath sounds bilaterally Cardiovascular: S1-S2 appreciated with positive valvular click Abdomen: Soft, bowel sounds appreciated Extremities: Skin is warm and dry Neuro: Sedated at present  GU: Due to void  Resolved Hospital Problem list     Assessment & Plan:   Acute toxic and metabolic encephalopathy History of bipolar disease with prior episodes of suicide attempt Concern for infectious etiology with pyelo on CT -Continue Precedex -Hold home meds including trazodone, Zyprexa, Depakote, clonazepam, Celexa-Will reintroduce as patient stabilizes  Possible right pyelonephritis -Continue empiric ceftriaxone -Follow cultures -Urine and blood culture unremarkable so far  Normal anion gap metabolic acidosis -Continue cautious hydration  History of aortic valve replacement in the setting of Marfan syndrome -Currently on heparin, was on warfarin at home -Carvedilol on hold  Anemia -Continue to monitor  Left ICA fusiform aneurysm -Will need outpatient follow-up  Suprarenal aortic aneurysm -Outpatient follow-up  Will be cautious about sedation as she does from agitation to significant sedation  Diet/type: N/A DVT prophylaxis: systemic heparin GI prophylaxis: N/A Lines: N/A Foley:  N/A Code Status:  full code Last date of multidisciplinary goals of care discussion [pending]  Labs   CBC: Recent Labs  Lab 03/17/23 0409 03/17/23  1610 03/17/23 0656 03/18/23 0044  WBC 9.4  --   --  8.0  NEUTROABS 7.2  --   --   --   HGB 11.4* 11.2* 10.9* 9.8*  HCT 34.5*  33.0* 32.0* 31.3*  MCV 87.8  --   --  87.4  PLT 212  --   --  198    Basic Metabolic Panel: Recent Labs  Lab 03/17/23 0409 03/17/23 0415 03/17/23 0656 03/17/23 0912 03/17/23 1651 03/18/23 0044  NA 138 143 141 141 141  --   K 3.5 3.6 3.5 3.5 4.2  --   CL 111 124*  --  113* 114*  --   CO2 13*  --   --  13* 16*  --   GLUCOSE 101* 106*  --  158* 126*  --   BUN 18 16  --  17 16  --   CREATININE 1.06* 1.00  --  1.14* 1.08*  --   CALCIUM 7.9*  --   --  7.6* 7.4*  --   MG  --   --   --   --   --  2.1  PHOS  --   --   --   --   --  2.7   GFR: Estimated Creatinine Clearance: 72.6 mL/min (A) (by C-G formula based on SCr of 1.08 mg/dL (H)). Recent Labs  Lab 03/17/23 0409 03/17/23 0720 03/17/23 0912 03/17/23 0942 03/18/23 0044  PROCALCITON  --   --  <0.10  --   --   WBC 9.4  --   --   --  8.0  LATICACIDVEN  --  0.8  --  1.5  --     Liver Function Tests: Recent Labs  Lab 03/17/23 0409 03/17/23 0912 03/17/23 1651  AST 21 24 26   ALT 12 13 15   ALKPHOS 44 39 37*  BILITOT 0.3 0.5 0.6  PROT 6.5 6.2* 5.8*  ALBUMIN 3.7 3.4* 3.0*   No results for input(s): "LIPASE", "AMYLASE" in the last 168 hours. No results for input(s): "AMMONIA" in the last 168 hours.  ABG    Component Value Date/Time   PHART 7.398 03/17/2023 0656   PCO2ART 20.4 (L) 03/17/2023 0656   PO2ART 146 (H) 03/17/2023 0656   HCO3 12.6 (L) 03/17/2023 0656   TCO2 13 (L) 03/17/2023 0656   ACIDBASEDEF 10.0 (H) 03/17/2023 0656   O2SAT 99 03/17/2023 0656     Coagulation Profile: Recent Labs  Lab 03/17/23 0409  INR 1.2    Cardiac Enzymes: No results for input(s): "CKTOTAL", "CKMB", "CKMBINDEX", "TROPONINI" in the last 168 hours.  HbA1C: Hgb A1c MFr Bld  Date/Time Value Ref Range Status  03/31/2021 02:52 PM 5.3 4.6 - 6.5 % Final    Comment:    Glycemic Control Guidelines for People with Diabetes:Non Diabetic:  <6%Goal of Therapy: <7%Additional Action Suggested:  >8%   10/31/2017 03:26 PM 5.2 4.6 - 6.5 %  Final    Comment:    Glycemic Control Guidelines for People with Diabetes:Non Diabetic:  <6%Goal of Therapy: <7%Additional Action Suggested:  >8%     CBG: Recent Labs  Lab 03/17/23 0659 03/17/23 1945 03/17/23 2353 03/18/23 0346 03/18/23 0745  GLUCAP 128* 113* 95 86 85    Review of Systems:   Not able   Past Medical History:  She,  has a past medical history of Anticoagulant long-term use, Anxiety, Ascending aorta dilatation (HCC), Bipolar disorder (HCC), Depression, Marfan's syndrome, Migraine, Mitral valve prolapse, Palpitations, Suicidal behavior, and Varicose  veins.   Surgical History:   Past Surgical History:  Procedure Laterality Date   ANKLE SURGERY Left    BREAST CYST EXCISION     CARDIAC VALVE SURGERY  09/2014   AV, MV replacement, aortic root replacement (Dr Kizzie Bane at [facility])   ICD IMPLANT N/A 01/05/2018   Procedure: ICD IMPLANT;  Surgeon: Marinus Maw, MD;  [facility] INVASIVE CV LAB;  Service: Cardiovascular;  Laterality: N/A;   INNER EAR SURGERY Right      Social History:   reports that she has been smoking cigarettes. She has a 18.00 pack-year smoking history. She uses smokeless tobacco. She reports that she does not drink alcohol and does not use drugs.   Family History:  Her family history includes Cancer (age of onset: 77) in her mother; Heart disease in her father; Marfan syndrome in her father, paternal grandmother, sister, and sister.   Allergies Allergies  Allergen Reactions   Abilify [Aripiprazole] Other (See Comments)    "Flattened affect" "couldn't do anything while taking it"    The patient is critically ill with multiple organ systems failure and requires high complexity decision making for assessment and support, frequent evaluation and titration of therapies, application of advanced monitoring technologies and extensive interpretation of multiple databases. Critical Care Time devoted to patient care services described in this note  independent of APP/resident time (if applicable)  is 32 minutes.   Virl Diamond MD [place] [facility] Personal pager: See Amion If unanswered, please page CCM On-call: #[phone]

## 2023-03-19 ENCOUNTER — Inpatient Hospital Stay (HOSPITAL_COMMUNITY): Payer: Medicaid Other

## 2023-03-19 LAB — BASIC METABOLIC PANEL
Anion gap: 10 (ref 5–15)
BUN: 17 mg/dL (ref 6–20)
CO2: 22 mmol/L (ref 22–32)
Calcium: 8 mg/dL — ABNORMAL LOW (ref 8.9–10.3)
Chloride: 111 mmol/L (ref 98–111)
Creatinine, Ser: 0.67 mg/dL (ref 0.44–1.00)
GFR, Estimated: >60 mL/min (ref >60)
Glucose, Bld: 84 mg/dL (ref 70–99)
Potassium: 3 mmol/L — ABNORMAL LOW (ref 3.5–5.1)
Sodium: 143 mmol/L (ref 135–145)

## 2023-03-19 LAB — PROTIME-INR
INR: 1.1 (ref 0.8–1.2)
Prothrombin Time: 14.8 seconds (ref 11.4–15.2)

## 2023-03-19 LAB — HEPATIC FUNCTION PANEL
ALT: 15 U/L (ref 0–44)
AST: 35 U/L (ref 15–41)
Albumin: 2.9 g/dL — ABNORMAL LOW (ref 3.5–5.0)
Alkaline Phosphatase: 32 U/L — ABNORMAL LOW (ref 38–126)
Bilirubin, Direct: 0.1 mg/dL (ref 0.0–0.2)
Indirect Bilirubin: 0.6 mg/dL (ref 0.3–0.9)
Total Bilirubin: 0.7 mg/dL (ref 0.3–1.2)
Total Protein: 5.3 g/dL — ABNORMAL LOW (ref 6.5–8.1)

## 2023-03-19 LAB — CULTURE, BLOOD (ROUTINE X 2): Culture: NO GROWTH

## 2023-03-19 LAB — GLUCOSE, CAPILLARY
Glucose-Capillary: 124 mg/dL — ABNORMAL HIGH (ref 70–99)
Glucose-Capillary: 130 mg/dL — ABNORMAL HIGH (ref 70–99)
Glucose-Capillary: 63 mg/dL — ABNORMAL LOW (ref 70–99)
Glucose-Capillary: 80 mg/dL (ref 70–99)
Glucose-Capillary: 83 mg/dL (ref 70–99)
Glucose-Capillary: 83 mg/dL (ref 70–99)

## 2023-03-19 LAB — MAGNESIUM: Magnesium: 1.8 mg/dL (ref 1.7–2.4)

## 2023-03-19 LAB — HEPARIN LEVEL (UNFRACTIONATED): Heparin Unfractionated: 0.35 IU/mL (ref 0.30–0.70)

## 2023-03-19 MED ORDER — WARFARIN SODIUM 5 MG PO TABS
5.0000 mg | ORAL_TABLET | Freq: Once | ORAL | Status: DC
  Filled 2023-03-19: qty 1

## 2023-03-19 MED ORDER — DEXMEDETOMIDINE HCL IN NACL 400 MCG/100ML IV SOLN
0.0000 ug/kg/h | INTRAVENOUS | Status: DC
  Administered 2023-03-19: 0.4 ug/kg/h via INTRAVENOUS
  Filled 2023-03-19: qty 100

## 2023-03-19 MED ORDER — DEXTROSE 10 % IV SOLN: INTRAVENOUS | Status: DC

## 2023-03-19 MED ORDER — CITALOPRAM HYDROBROMIDE 20 MG PO TABS
20.0000 mg | ORAL_TABLET | Freq: Every day | ORAL | Status: DC
  Administered 2023-03-20 – 2023-03-25 (×6): 20 mg via ORAL
  Filled 2023-03-19 (×7): qty 1

## 2023-03-19 MED ORDER — DEXTROSE 50 % IV SOLN
12.5000 g | INTRAVENOUS | Status: AC
  Administered 2023-03-19: 12.5 g via INTRAVENOUS

## 2023-03-19 MED ORDER — OLANZAPINE 10 MG PO TABS
20.0000 mg | ORAL_TABLET | Freq: Every day | ORAL | Status: DC
  Administered 2023-03-19 – 2023-03-24 (×6): 20 mg via ORAL
  Filled 2023-03-19 (×7): qty 2

## 2023-03-19 MED ORDER — POTASSIUM CHLORIDE CRYS ER 20 MEQ PO TBCR
40.0000 meq | EXTENDED_RELEASE_TABLET | Freq: Two times a day (BID) | ORAL | Status: DC
  Administered 2023-03-19: 40 meq via ORAL
  Filled 2023-03-19: qty 2

## 2023-03-19 MED ORDER — WARFARIN - PHARMACIST DOSING INPATIENT: Freq: Every day | Status: DC

## 2023-03-19 MED ORDER — DEXTROSE 50 % IV SOLN
INTRAVENOUS | Status: AC
  Filled 2023-03-19: qty 50

## 2023-03-19 NOTE — Progress Notes (Addendum)
ANTICOAGULATION CONSULT NOTE - Follow-up Consult  Pharmacy Consult for heparin + warfarin Indication: aortic and mitral valve replacements setting of Marfan syndrome   Allergies  Allergen Reactions   Abilify [Aripiprazole] Other (See Comments)    "Flattened affect" "couldn't do anything while taking it"    Patient Measurements: Height: 6\' 2"  (188 cm) Weight: 76.1 kg (167 lb 12.3 oz) IBW/kg (Calculated) : 77.7 Heparin Dosing Weight: 70 kg  Vital Signs: Temp: 99.1 F (37.3 C) (06/08 0328) Temp Source: Oral (06/08 0328) BP: 107/57 (06/08 0600) Pulse Rate: 47 (06/08 0600)  Labs: Recent Labs    03/17/23 0409 03/17/23 0415 03/17/23 8657 03/17/23 0656 03/17/23 0912 03/17/23 1651 03/17/23 1651 03/18/23 0044 03/18/23 0946 03/18/23 1732 03/19/23 0407  HGB 11.4* 11.2*  --  10.9*  --   --   --  9.8*  --   --   --   HCT 34.5* 33.0*  --  32.0*  --   --   --  31.3*  --   --   --   PLT 212  --   --   --   --   --   --  198  --   --   --   APTT 35  --   --   --   --   --   --   --   --   --   --   LABPROT 15.5*  --   --   --   --   --   --   --   --   --   --   INR 1.2  --   --   --   --   --   --   --   --   --   --   HEPARINUNFRC  --   --   --   --   --  0.25*   < > 0.17* 0.23* 0.33 0.35  CREATININE 1.06* 1.00  --   --  1.14* 1.08*  --   --   --   --   --   TROPONINIHS 15  --  15  --   --   --   --   --   --   --   --    < > = values in this interval not displayed.     Estimated Creatinine Clearance: 78.2 mL/min (A) (by C-G formula based on SCr of 1.08 mg/dL (H)).   Medical History: Past Medical History:  Diagnosis Date   Anticoagulant long-term use    after valve surgery for Marfan's syndrome   Anxiety    Ascending aorta dilatation (HCC)    Bipolar disorder (HCC)    Depression    Marfan's syndrome    Dr. Beverely Pace   Migraine    Mitral valve prolapse    Palpitations    Suicidal behavior    Tylenol Overdose   Varicose veins     Assessment: 47 yo female with  history of aortic valve replacement setting of Marfan syndrome admitted with AMS. Patient previously on warfarin; however, does not seem to be taking based on medication dispense history (last filled 10/2022), documentation of non adherence, and subtherapeutic INR. Pharmacy consulted to dose IV heparin.   Heparin level 0.35 is therapeutic on 1500 units/hr.   Goal of Therapy:  Heparin level 0.3-0.7 units/ml Monitor platelets by anticoagulation protocol: Yes   Plan:  Continue heparin to 1500 units/hr Check heparin level and CBC  daily Monitor for s/sx of bleeding   Calton Dach, PharmD Clinical Pharmacist 03/19/2023 7:53 AM  --- PM Addendum ---  Pharmacy consulted to restart warfarin while on heparin bridge Pt has reported struggling to fill medications prior to admission with reported non-adherence   Last documented (2023) PTA regimen: warfarin 5mg  Monday, Wednesday, Friday, Saturday, Sunday and 7.5mg  on Tuesday, Thursday  INR goal 2.5-3.5  Last INR (6/6): 1.2   INR 6/8: 1.1  Plan for warfarin 5 mg PO x1 today  Daily INR, CBC F/u s/sx bleeding, med accessibility issues

## 2023-03-19 NOTE — Progress Notes (Signed)
NAME:  Melissa Hayden, MRN:  [id number], DOB:  Sep 25, 1976, LOS: 2 ADMISSION DATE:  03/17/2023, CONSULTATION DATE:  6/6 REFERRING MD:  floyd, CHIEF COMPLAINT: acute metabolic encephalopathy and metabolic acidosis    History of Present Illness:  47 year old chronically ill-appearing female patient is brought in by EMS on 6/6 for slurred speech confusion, and dizziness.  Last seen normal earlier that evening.  No fever cough rashes chest pain.  Reported headache earlier in the evening.  Was able to provide initial history. Was added in as a code stroke, became progressively confused while in the emergency room CT head was negative for acute findings, there were arterial manifestations of connective tissue disease with right ICA beading and left ICA fusiform aneurysm, the CT chest was negative for aortic dissection there is a small wedge-shaped region in the upper pole of the right kidney there was a 3.1 cm suprarenal aortic aneurysm.  Initial glucose 121, INR 1.2 white blood cell count 9.4 she had a normal anion gap metabolic acidosis ethanol level was less than 10.  Because of her progressive confusion she was placed on Precedex infusion critical care asked to admit  Pertinent  Medical History  Asthma, Marfan syndrome, prior aortic root and valve replacement.  On chronic Coumadin. Bipolar disease with prior suicidal attempt Current home medication list includes: Celexa, Coreg, Klonopin, Depakote ER, Zyprexa, rizatriptan for headache, trazodone and warfarin Significant Hospital Events: Including procedures, antibiotic start and stop dates in addition to other pertinent events   6/6 admitted with acute mental status change 6/8 currently off precedex due to bradycardia, overnight needed a posey belt, more responsive   Interim History / Subjective:  Follows commands, intermittently  Objective   Blood pressure (!) 107/57, pulse (!) 47, temperature (!) 97.4 F (36.3 C), temperature source Oral, resp.  rate 14, height 6\' 2"  (1.88 m), weight 76.1 kg, SpO2 96 %.        Intake/Output Summary (Last 24 hours) at 03/19/2023 1051 Last data filed at 03/19/2023 0600 Gross per 24 hour  Intake 1167.08 ml  Output 625 ml  Net 542.08 ml    Filed Weights   03/17/23 0354 03/18/23 0500 03/19/23 0454  Weight: 70.8 kg 70.7 kg 76.1 kg   Examination: General: ill appearing adult female, lying in ICU bed  HENT: Normocephalic, Poor dentition, dry/pink MM  Cardio: s1, s2, auscultated, bradycardia, no m/r/g Pulm: CTA to diminished, no respiratory distress  Abd: BS active  GU: Foley in place, amber urine Skin: warm, dry  Extremities: no edema, moves extremities  Neuro: drowsy, follows commands, PERRLA intact   Resolved Hospital Problem list   Normal Anion Gap Acidosis   Assessment & Plan:   Acute toxic and metabolic encephalopathy History of bipolar disease with prior episodes of suicide attempt Concern for infectious etiology with pyelo on CT Patient reports that "she has not had a drink of ETOH in years."   P:  Turn off precedex, dc as well as CIWA protocol Will obtain psych consult  Delirium precautions Minimize sedating meds If remains of precedex, transfer out of ICU   Abnormal CT  CT abdomen/pelvis concerns for small wedge-shaped region of hypoenhancement related to renal infarct vs pyelonephritis  UA and UC, BC unremarkable, negative fever curve, no leukocytosis/lactic acidosis/procal P: Will obtain renal ultrasound DC ceftriaxone   History of aortic valve replacement in the setting of Marfan syndrome On home warfarin  P:  Receiving IV heparin, continue for now Restart warfarin when appropriate  Hypokalemia  K 3.0  P: Replace with 40 meq PO BID   Anemia No evidence of bleeding  P: Continue to trend H/H Monitor for signs of bleeding Transfuse for hgb <7   Left ICA fusiform aneurysm P: Follow up outpatient   Suprarenal aortic aneurysm P: Follow up outpatient    Best practice  Diet/type: N/A DVT prophylaxis: systemic heparin GI prophylaxis: N/A Lines: N/A Foley:  N/A Code Status:  full code Last date of multidisciplinary goals of care discussion [pending]   CRITICAL CARE Performed by: Rochel Brome S-ACNP    Total critical care time: 35 minutes  Critical care time was exclusive of separately billable procedures and treating other patients.  Critical care was necessary to treat or prevent imminent or life-threatening deterioration.  Critical care was time spent personally by me on the following activities: development of treatment plan with patient and/or surrogate as well as nursing, discussions with consultants, evaluation of patient's response to treatment, examination of patient, obtaining history from patient or surrogate, ordering and performing treatments and interventions, ordering and review of laboratory studies, ordering and review of radiographic studies, pulse oximetry and re-evaluation of patient's condition.

## 2023-03-20 DIAGNOSIS — G9341 Metabolic encephalopathy: Principal | ICD-10-CM

## 2023-03-20 LAB — CBC
HCT: 32.8 % — ABNORMAL LOW (ref 36.0–46.0)
Hemoglobin: 10.5 g/dL — ABNORMAL LOW (ref 12.0–15.0)
MCH: 28.2 pg (ref 26.0–34.0)
MCHC: 32 g/dL (ref 30.0–36.0)
MCV: 87.9 fL (ref 80.0–100.0)
Platelets: 204 10*3/uL (ref 150–400)
RBC: 3.73 MIL/uL — ABNORMAL LOW (ref 3.87–5.11)
RDW: 15 % (ref 11.5–15.5)
WBC: 4 10*3/uL (ref 4.0–10.5)
nRBC: 0 % (ref 0.0–0.2)

## 2023-03-20 LAB — CULTURE, BLOOD (ROUTINE X 2): Special Requests: ADEQUATE

## 2023-03-20 LAB — PROTIME-INR
INR: 1.2 (ref 0.8–1.2)
Prothrombin Time: 15 seconds (ref 11.4–15.2)

## 2023-03-20 LAB — FOLATE: Folate: 12.8 ng/mL (ref >5.9)

## 2023-03-20 LAB — COMPREHENSIVE METABOLIC PANEL
ALT: 15 U/L (ref 0–44)
AST: 25 U/L (ref 15–41)
Albumin: 3 g/dL — ABNORMAL LOW (ref 3.5–5.0)
Alkaline Phosphatase: 32 U/L — ABNORMAL LOW (ref 38–126)
Anion gap: 7 (ref 5–15)
BUN: 12 mg/dL (ref 6–20)
CO2: 21 mmol/L — ABNORMAL LOW (ref 22–32)
Calcium: 8.1 mg/dL — ABNORMAL LOW (ref 8.9–10.3)
Chloride: 110 mmol/L (ref 98–111)
Creatinine, Ser: 0.62 mg/dL (ref 0.44–1.00)
GFR, Estimated: >60 mL/min (ref >60)
Glucose, Bld: 128 mg/dL — ABNORMAL HIGH (ref 70–99)
Potassium: 2.8 mmol/L — ABNORMAL LOW (ref 3.5–5.1)
Sodium: 138 mmol/L (ref 135–145)
Total Bilirubin: 0.4 mg/dL (ref 0.3–1.2)
Total Protein: 5.3 g/dL — ABNORMAL LOW (ref 6.5–8.1)

## 2023-03-20 LAB — AMMONIA: Ammonia: 30 umol/L (ref 9–35)

## 2023-03-20 LAB — HEPARIN LEVEL (UNFRACTIONATED): Heparin Unfractionated: 0.34 IU/mL (ref 0.30–0.70)

## 2023-03-20 LAB — VITAMIN B12: Vitamin B-12: 648 pg/mL (ref 180–914)

## 2023-03-20 LAB — RPR: RPR Ser Ql: NONREACTIVE

## 2023-03-20 LAB — TSH: TSH: 7.074 u[IU]/mL — ABNORMAL HIGH (ref 0.350–4.500)

## 2023-03-20 MED ORDER — WARFARIN SODIUM 5 MG PO TABS
5.0000 mg | ORAL_TABLET | Freq: Once | ORAL | Status: AC
  Administered 2023-03-20: 5 mg via ORAL
  Filled 2023-03-20: qty 1

## 2023-03-20 MED ORDER — POTASSIUM CHLORIDE CRYS ER 20 MEQ PO TBCR
40.0000 meq | EXTENDED_RELEASE_TABLET | Freq: Three times a day (TID) | ORAL | Status: AC
  Administered 2023-03-20 (×3): 40 meq via ORAL
  Filled 2023-03-20 (×3): qty 2

## 2023-03-20 MED ORDER — ACETAMINOPHEN 500 MG PO TABS
500.0000 mg | ORAL_TABLET | ORAL | Status: DC | PRN
  Administered 2023-03-20 – 2023-03-23 (×4): 500 mg via ORAL
  Filled 2023-03-20 (×3): qty 1

## 2023-03-20 NOTE — Progress Notes (Addendum)
Progress Note   Patient: Melissa Hayden UJW:[id number] DOB: 04-29-1976 DOA: 03/17/2023     3 DOS: the patient was seen and examined on 03/20/2023 at 8:49AM      Brief hospital course: Mrs. Melissa Hayden is a 47 y.o. F with Marfan's syndrome, hx MVR and AVR on warfarin, hx NICM/sCHF EF30-35% recovered to 50%, and depression who presented with 3 weeks confusion.  On admission, the patient was progressively delirious and required Precedex and admission to the ICU.  She was ultimately able to wean off Precedex and able to give coherent history that her symptoms were dysarthria and dizziness starting 2-3 weeks PTA.  These symptoms were initially severe, but improved slightly and she didn't seek care because she was afraid, but eventually developed severe headache that prompted her to come to the ER.     Assessment and Plan: Acute metabolic encephalopathy Unclear precipitant.  She was disoriented, not paranoid, hallucinating.  She denies alcohol use or other drugs.  She was treated for infection here initially, but had no fever, leukocytosis, abx were stopped and she has had no clinical change. - Obtain MRI brain - Consult Neurology    Dysarthria There is no tongue swelling.  To me she reports that this started several weeks prior to admission (not the night before as some notes indicate).  It was initially more severe, then improved a little, but is still present.   - Obtain MRI brain - Consult Neurology    Renal infarct There was initial concern that this represented infection, but UA normal, UCx no growth.  Suspect small infarct due to missed warfarin. - Continue warfarin - Continue heparin bridge    History of aortic valve and mitral valve replacement in the setting of Marfan syndrome Had run out of warfarin a few weeks prior to admission due to financial issues (lost car, couldn't get to pharmacy) - Continue warfarin - Continue heparin bridge   Anemia, normocytic Hgb stable, no  clinical bleeding.  Hypokalemia - Supplement K  Depression - Continue Celexa, olanzapine  Chronic systolic congestive heart failure Appears compensated.  Not on diuretics at home.  EF this admission recovered to 50-55%.           Subjective: Still has dysarthria, still has some fogginess of thinking.  No chest pain, dyspnea, urinary symptoms, malaise.  No fever.  Transferred out of the unit yesterday afternoon     Physical Exam: BP 118/64 (BP Location: Left Arm)   Pulse (!) 51   Temp 97.7 F (36.5 C)   Resp 17   Ht 6\' 2"  (1.88 m)   Wt 76.1 kg   SpO2 97%   BMI 21.54 kg/m   Thin adult female, lying in bed, interactive and appropriate RRR, soft systolic murmur, no peripheral edema No JVD Respiratory rate normal, lungs clear without rales or wheezes Abdomen soft without tenderness palpation or guarding Pupils are equal and reactive.  Extraocular movements are intact, without nystagmus.  Cranial nerve 5 is within normal limits.  Cranial nerve 7 is symmetrical.  Cranial nerve 8 is within normal limits.  Cranial nerves 9 and 10 reveal equal palate elevation.  Cranial nerve 11 reveals sternocleidomastoid strong.  Cranial nerve 12 is midline.  Motor strength testing is 5/5 in the upper and lower extremities bilaterally with normal motor, tone and bulk. Finger-to-nose testing is within normal limits. The patient is oriented to time, place and person.  Speech is dysarthric.  Naming is moderately impaired (only able to name 2  farm animals in 1 minute).  Short term memory also suspect is slightly impaired.    Attention span and concentration are within normal limits.      Data Reviewed: Discussed with Neurology by secure chat BMP shows hypokalemia, normal renal function CBC shows mild anemia, stable       Disposition: Status is: Inpatient Admitted for encephalopathy.  The encephalopathy has cleared but she has dysarthria which is unusual.  Needs MRI brain and Neurology  consults.    If all reassuring, possibly home in the next day or two         Author: Alberteen Sam, MD 03/20/2023 1:32 PM  For on call review [email or web address].

## 2023-03-20 NOTE — Consult Note (Signed)
NEUROLOGY CONSULTATION NOTE   Date of service: March 20, 2023 Patient Name: Melissa Hayden MRN:  [id number] DOB:  1976/07/04 Reason for consult: "slurred speech, tortuous carotids on CTA" Requesting Provider: Alberteen Sam, * _ _ _   _ __   _ __ _ _  __ __   _ __   __ _  History of Present Illness  Melissa Hayden Confair is a 47 y.o. female with PMH significant for marfans s/p aortic root repair and aortic valve replacement on long term AC with warfarin, bipolar, migraines, who presented to the ED with intermittent slurred speech for last few weeks along with confusion and dizziness and short of breath. In the ED, noted to have diminished breath sound with wheezing.  She was started on Bipap and precedex infusion. Encephalopathy improved and was transferred out of ICU.  She reports that slurred speech started a few weeks ago. Noticeable only when she is continuously talking and left side of her lip is down. Also occurs when she is tired. She reports that she lost her car and has not been able to get to her pharmacy to pick up her warfarin. She has not been on ancticoagulation for the last 3 months.   ROS   Constitutional Denies weight loss, fever and chills.   HEENT Denies changes in vision and hearing.   Respiratory Denies SOB and cough.   CV Denies palpitations and CP   GI Denies abdominal pain, nausea, vomiting and diarrhea.   GU Denies dysuria and urinary frequency.   MSK Denies myalgia and joint pain.   Skin Denies rash and pruritus.   Neurological Denies headache and syncope.   Psychiatric Denies recent changes in mood. Denies anxiety and depression.    Past History   Past Medical History:  Diagnosis Date   Anticoagulant long-term use    after valve surgery for Marfan's syndrome   Anxiety    Ascending aorta dilatation (HCC)    Bipolar disorder (HCC)    Depression    Marfan's syndrome    Dr. Beverely Pace   Migraine    Mitral valve prolapse    Palpitations    Suicidal behavior     Tylenol Overdose   Varicose veins    Past Surgical History:  Procedure Laterality Date   ANKLE SURGERY Left    BREAST CYST EXCISION     CARDIAC VALVE SURGERY  09/2014   AV, MV replacement, aortic root replacement (Dr Kizzie Bane at [facility])   ICD IMPLANT N/A 01/05/2018   Procedure: ICD IMPLANT;  Surgeon: Marinus Maw, MD;  Location: [facility] INVASIVE CV LAB;  Service: Cardiovascular;  Laterality: N/A;   INNER EAR SURGERY Right    Family History  Problem Relation Age of Onset   Cancer Mother 20       Breast    Marfan syndrome Father    Heart disease Father    Marfan syndrome Sister    Marfan syndrome Paternal Grandmother    Marfan syndrome Sister    Social History   Socioeconomic History   Marital status: Divorced    Spouse name: Not on file   Number of children: 0   Years of education: 16   Highest education level: Not on file  Occupational History   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  Off and on    Alcohol Use:  None   Drug Use:  None   Diet:  Regular   Exercise:  None   Hobbies:  Reading, Traveling             Social Determinants of Health   Financial Resource Strain: Not on file  Food Insecurity: Not on file  Transportation Needs: Not on file  Physical Activity: Not on file  Stress: Not on file  Social Connections: Not on file   Allergies  Allergen Reactions   Abilify [Aripiprazole] Other (See Comments)    "Flattened  affect" "couldn't do anything while taking it"    Medications   Medications Prior to Admission  Medication Sig Dispense Refill Last Dose   acetaminophen (TYLENOL) 650 MG CR tablet Take 1,300 mg by mouth every 8 (eight) hours as needed (for headache/pain relief).      citalopram (CELEXA) 20 MG tablet Take one tab daily 90 tablet 0    EPINEPHrine 0.3 mg/0.3 mL IJ SOAJ injection USE AS DIRECTED WHEN NEEDED 1 each 99    Multiple Vitamin (MULTIVITAMIN WITH MINERALS) TABS tablet Take 1 tablet by mouth daily. Centrum Ultra Women's Oral      OLANZapine (ZYPREXA) 20 MG tablet TAKE 1 TABLET BY MOUTH EVERYDAY AT BEDTIME 90 tablet 0    rizatriptan (MAXALT) 10 MG tablet TAKE 1 TABLET BY MOUTH AS NEEDED FOR MIGRAINE. MAY REPEAT IN 2 HOURS IF NEEDED 10 tablet 0    traZODone (DESYREL) 150 MG tablet Take 1 tablet (150 mg total) by mouth at bedtime. TAKE 1 TABLET BY MOUTH EVERYDAY AT BEDTIME 90 tablet 0    vortioxetine HBr (TRINTELLIX) 10 MG TABS tablet Take 1 tablet (10 mg total) by mouth daily. (Patient not taking: Reported on 04/21/2022) 10 tablet 0    warfarin (COUMADIN) 5 MG tablet TAKE 1 TABLET DAILY ON SUN,MON,WED,FRI,SAT. TAKES 7.5 MG ON TU AND THU 124 tablet 1      Vitals   Vitals:   03/19/23 1655 03/19/23 1942 03/20/23 0442 03/20/23 0802  BP: 135/62 132/66 (!) 104/56 118/64  Pulse: (!) 56 (!) 56 68 (!) 51  Resp: 18 16 16 17   Temp:  98.6 F (37 C) 97.8 F (36.6 C) 97.7 F (36.5 C)  TempSrc:      SpO2: 97% 98% 97% 97%  Weight:      Height:         Body mass index is 21.54 kg/m.  Physical Exam   General: Laying comfortably in bed; in no acute distress.  HENT: Normal oropharynx and mucosa. Normal external appearance of ears and nose.  Neck: Supple, no pain or tenderness  CV: No JVD. No peripheral edema.  Pulmonary: Symmetric Chest rise. Normal respiratory effort.  Abdomen: Soft to touch, non-tender.  Ext: No cyanosis, edema, or deformity  Skin: No rash. Normal palpation of skin.    Musculoskeletal: Normal digits and nails by inspection. No clubbing.   Neurologic Examination  Mental status/Cognition: Alert, oriented to self, place, month and year, good attention.  Speech/language: mildly hoarse and mild dysarthria, Fluent, comprehension intact, object naming intact, repetition intact.  Cranial nerves:   CN II Pupils equal and reactive to light, no VF deficits    CN III,IV,VI EOM intact, no gaze preference or deviation, no nystagmus    CN V normal sensation in V1, V2, and V3 segments bilaterally    CN VII no asymmetry, no nasolabial fold flattening    CN VIII normal hearing to speech    CN IX & X normal palatal elevation, no uvular deviation    CN XI 5/5 head turn and 5/5 shoulder shrug bilaterally    CN XII midline tongue protrusion    Motor:  Muscle bulk: normal, tone normal, pronator drift none tremor none Mvmt Root Nerve  Muscle Right Left Comments  SA C5/6 Ax Deltoid 5 5   EF C5/6 Mc Biceps 5 5   EE C6/7/8 Rad Triceps 5 5   WF C6/7 Med FCR     WE C7/8 PIN ECU     F Ab C8/T1 U ADM/FDI 5 5   HF L1/2/3 Fem Illopsoas 5 5   KE L2/3/4 Fem Quad 5 5   DF L4/5 D Peron Tib Ant 5 5   PF S1/2 Tibial Grc/Sol 5 5    Sensation:  Light touch Intact throughout   Pin prick    Temperature    Vibration   Proprioception    Coordination/Complex Motor:  - Finger to Nose intact BL - Heel to shin intact BL - Rapid alternating movement are normal - Gait: deferred.  Labs   CBC:  Recent Labs  Lab 03/17/23 0409 03/17/23 0415 03/18/23 0044 03/20/23 0148  WBC 9.4  --  8.0 4.0  NEUTROABS 7.2  --   --   --   HGB 11.4*   < > 9.8* 10.5*  HCT 34.5*   < > 31.3* 32.8*  MCV 87.8  --  87.4 87.9  PLT 212  --  198 204   < > = values in this interval not displayed.    Basic Metabolic Panel:  Lab Results  Component Value Date   NA 138 03/20/2023   K 2.8 (L) 03/20/2023   CO2 21 (L) 03/20/2023   GLUCOSE 128 (H) 03/20/2023   BUN 12 03/20/2023   CREATININE 0.62  03/20/2023   CALCIUM 8.1 (L) 03/20/2023   GFRNONAA >60 03/20/2023   GFRAA 109 01/02/2018   Lipid Panel:  Lab Results  Component Value Date   LDLCALC 124 (H) 03/31/2021   HgbA1c:  Lab Results  Component Value Date   HGBA1C 5.3 03/31/2021   Urine Drug Screen:     Component Value Date/Time   LABOPIA NONE DETECTED 03/17/2023 0800   COCAINSCRNUR NONE DETECTED 03/17/2023 0800   LABBENZ NONE DETECTED 03/17/2023 0800   AMPHETMU NONE DETECTED 03/17/2023 0800   THCU NONE DETECTED 03/17/2023 0800   LABBARB NONE DETECTED 03/17/2023 0800    Alcohol Level     Component Value Date/Time   ETH <10 03/17/2023 0409    CT Head without contrast(Personally reviewed): CTH was negative for a large hypodensity concerning for a large territory infarct or hyperdensity concerning for an ICH  CT angio Head and Neck with contrast(Personally reviewed): Generalized arterial tortuosity in the neck as expected in marfans syndrome.  MRI Brain: pending  Impression   SHATEARA TIPPIE is a 47 y.o. female  with PMH significant for marfans  s/p aortic root repair and aortic valve replacement on long term AC with warfarin, bipolar, migraines, who presented to the ED with intermittent slurred speech for last few weeks along with confusion and dizziness and short of breath. Was in the ICU on Bipap and noe on the floor.  She reports that slurred speech started a few weeks ago. Has not been on warfarin for 3 months prior to coming in as she lost her car and unable to drive to pharmacy.  I suspect that the noted slurring of speech is probably from a small stroke that she had when she ran out of warfarin. Another possibility specially since she also has some hoarseness if vocal cord dysfunction which she is at risk given hx of marfans.  Recommendations  - Agree with MRI Brain without contrast to evaluate for small stroke. MRI maybe completely normal at this time since slurred speech has been going on for a few weeks. A  small stroke may not show up on MRI few weeks after symptoms. - counseled her extensively about elevated risk of stroke in the setting of not taking her anticoagulation. - I would ask to look into whether medications can be mailed to her home address to decrease risk of non compliance with anticoagulation - outpatient ENT follow up to evaluate for potential vocal cord dysfunction. She is at risk with her hx of marfans. - no further workup if the MRI Brain is negative. We will follow up on MRI brain but will likely not be seeing the patient again inpatient. IF the team feels that patient would benefit from re-evaluation, please reach out to [name]. ______________________________________________________________________   Thank you for the opportunity to take part in the care of this patient. If you have any further questions, please contact the neurology consultation attending.  Signed,  Erick Blinks [facility] _ _ _   _ __   _ __ _ _  __ __   _ __   __ _

## 2023-03-20 NOTE — Progress Notes (Signed)
ANTICOAGULATION CONSULT NOTE - Follow-up Consult  Pharmacy Consult for heparin + warfarin Indication: aortic and mitral valve replacements setting of Marfan syndrome   Allergies  Allergen Reactions   Abilify [Aripiprazole] Other (See Comments)    "Flattened affect" "couldn't do anything while taking it"    Patient Measurements: Height: 6\' 2"  (188 cm) Weight: 76.1 kg (167 lb 12.3 oz) IBW/kg (Calculated) : 77.7 Heparin Dosing Weight: 70 kg  Vital Signs: Temp: 97.8 F (36.6 C) (06/09 0442) BP: 104/56 (06/09 0442) Pulse Rate: 68 (06/09 0442)  Labs: Recent Labs    03/17/23 1651 03/18/23 0044 03/18/23 0946 03/18/23 1732 03/19/23 0407 03/19/23 0958 03/19/23 1447 03/20/23 0148  HGB  --  9.8*  --   --   --   --   --  10.5*  HCT  --  31.3*  --   --   --   --   --  32.8*  PLT  --  198  --   --   --   --   --  204  LABPROT  --   --   --   --   --   --  14.8 15.0  INR  --   --   --   --   --   --  1.1 1.2  HEPARINUNFRC 0.25* 0.17*   < > 0.33 0.35  --   --  0.34  CREATININE 1.08*  --   --   --   --  0.67  --  0.62   < > = values in this interval not displayed.     Estimated Creatinine Clearance: 105.6 mL/min (by C-G formula based on SCr of 0.62 mg/dL).   Medical History: Past Medical History:  Diagnosis Date   Anticoagulant long-term use    after valve surgery for Marfan's syndrome   Anxiety    Ascending aorta dilatation (HCC)    Bipolar disorder (HCC)    Depression    Marfan's syndrome    Dr. Beverely Pace   Migraine    Mitral valve prolapse    Palpitations    Suicidal behavior    Tylenol Overdose   Varicose veins     Assessment: 47 yo female with history of aortic valve replacement setting of Marfan syndrome admitted with AMS. Patient previously on warfarin; however, does not seem to be taking based on medication dispense history (last filled 10/2022), documentation of non adherence, and subtherapeutic INR. Pharmacy consulted to dose IV heparin and restart warfarin  while on heparin bridge.   Heparin level 0.34 (therapeutic) with heparin running at 1500 units/hr. Warfarin not administered as ordered 6/8 PM. INR remains subtherapeutic this AM at 1.2. CBC remains stable.   Last documented (2023) PTA regimen: warfarin 5mg  Monday, Wednesday, Friday, Saturday, Sunday and 7.5mg  on Tuesday, Thursday  Goal of Therapy:  Heparin level 0.3-0.7 units/ml INR goal 2.5-3.5 Monitor platelets by anticoagulation protocol: Yes   Plan:  Continue heparin at 1,500 units/hr Warfarin 5 mg PO x1 per PTA regimen  Check heparin level, INR, and CBC daily Monitor for s/sx of bleeding, med accessibility issues  Jani Gravel, PharmD PGY-2 Infectious Diseases Resident  03/20/2023 7:48 AM

## 2023-03-20 NOTE — Evaluation (Signed)
Clinical/Bedside Swallow Evaluation Patient Details  Name: Melissa Hayden MRN: [id number] Date of Birth: August 21, 1976  Today's Date: 03/20/2023 Time: SLP Start Time (ACUTE ONLY): 0830 SLP Stop Time (ACUTE ONLY): 0840 SLP Time Calculation (min) (ACUTE ONLY): 10 min  Past Medical History:  Past Medical History:  Diagnosis Date   Anticoagulant long-term use    after valve surgery for Marfan's syndrome   Anxiety    Ascending aorta dilatation (HCC)    Bipolar disorder (HCC)    Depression    Marfan's syndrome    Dr. Beverely Pace   Migraine    Mitral valve prolapse    Palpitations    Suicidal behavior    Tylenol Overdose   Varicose veins    Past Surgical History:  Past Surgical History:  Procedure Laterality Date   ANKLE SURGERY Left    BREAST CYST EXCISION     CARDIAC VALVE SURGERY  09/2014   AV, MV replacement, aortic root replacement (Dr Kizzie Bane at [facility])   ICD IMPLANT N/A 01/05/2018   Procedure: ICD IMPLANT;  Surgeon: Marinus Maw, MD;  Location: [facility] INVASIVE CV LAB;  Service: Cardiovascular;  Laterality: N/A;   INNER EAR SURGERY Right    HPI:  47 year old chronically ill-appearing female patient is brought in by EMS on 6/6 for slurred speech confusion, and dizziness.  Last seen normal earlier that evening.  No fever cough rashes chest pain.  Reported headache earlier in the evening.  Was able to provide initial history. Was added in as a code stroke, became progressively confused while in the emergency room CT head was negative for acute findings, there were arterial manifestations of connective tissue disease with right ICA beading and left ICA fusiform aneurysm, the CT chest was negative for aortic dissection there is a small wedge-shaped region in the upper pole of the right kidney there was a 3.1 cm suprarenal aortic aneurysm. Now c/o delayed swallow sensation, slowed processing, difficulty finding words.    Assessment / Plan / Recommendation  Clinical Impression  Melissa Hayden reports a  feeling of a delay when swallowing, especially liquids. She does report this is chronic in nature and unchanged with this hospitalization. She has no respiratory concerns and no unintentional weight loss. Oral mech exam was grossly WNL with noted slowed movements, which appeared to be more related to processing speeds versus weakness/discoordination. Some slurring is noted in her speech, but she remains fully intelligible. She was seen with thin liquids, regular solids, and during medication administration this morning. She requested her potassium pill be broken into fourths, as it is very large. With this, she had no difficulty. No s/s aspiration or dysphagia otherwise. She did have a brief oral hold noted, but swallow appears functional to meet nutrition/hydration needs. No follow up for swallow indicated. SLP team to follow for speech/language/cognition.  SLP Visit Diagnosis: Dysphagia, unspecified (R13.10)    Aspiration Risk  Mild aspiration risk    Diet Recommendation Thin liquid;Regular   Medication Administration: Other (Comment) (large pills broken in half) Supervision: Patient able to self feed Postural Changes: Seated upright at 90 degrees    Other  Recommendations Oral Care Recommendations: Oral care BID    Recommendations for follow up therapy are one component of a multi-disciplinary discharge planning process, led by the attending physician.  Recommendations may be updated based on patient status, additional functional criteria and insurance authorization.  Follow up Recommendations Outpatient SLP      Assistance Recommended at Discharge PRN  Functional Status Assessment Patient  has had a recent decline in their functional status and demonstrates the ability to make significant improvements in function in a reasonable and predictable amount of time.  Frequency and Duration min 1 x/week          Prognosis   Good     Swallow Study   General Date of Onset: 03/18/23 HPI:  47 year old chronically ill-appearing female patient is brought in by EMS on 6/6 for slurred speech confusion, and dizziness.  Last seen normal earlier that evening.  No fever cough rashes chest pain.  Reported headache earlier in the evening.  Was able to provide initial history. Was added in as a code stroke, became progressively confused while in the emergency room CT head was negative for acute findings, there were arterial manifestations of connective tissue disease with right ICA beading and left ICA fusiform aneurysm, the CT chest was negative for aortic dissection there is a small wedge-shaped region in the upper pole of the right kidney there was a 3.1 cm suprarenal aortic aneurysm. Now c/o delayed swallow sensation, slowed processing, difficulty finding words. Type of Study: Bedside Swallow Evaluation Previous Swallow Assessment: passed swallow screen Diet Prior to this Study: Regular;Thin liquids (Level 0) Temperature Spikes Noted: No Respiratory Status: Room air History of Recent Intubation: No Behavior/Cognition: Alert;Cooperative;Pleasant mood Oral Cavity Assessment: Within Functional Limits Oral Care Completed by SLP: No Oral Cavity - Dentition: Adequate natural dentition Vision: Functional for self-feeding Self-Feeding Abilities: Able to feed self Patient Positioning: Upright in bed Baseline Vocal Quality: Normal Volitional Cough: Strong Volitional Swallow: Able to elicit    Oral/Motor/Sensory Function Overall Oral Motor/Sensory Function: Within functional limits   Pills   With Thin Liquid: Within functional limits  Thin Liquid Thin Liquid: Within functional limits Presentation: Straw;Cup    Solid     Solid: Within functional limits Presentation: Self Fed     Melissa Hayden, M.S., CCC-SLP Speech-Language Pathologist Acute Rehabilitation Services Pager: [phone]  Melissa Hayden 03/20/2023,9:14 AM

## 2023-03-20 NOTE — Consult Note (Signed)
[facility] Face-to-Face Psychiatry Consult   Reason for Consult: '' Hx of bipolar with SI attempts in past. Patient reports having issues getting prescriptions, has not been taking medications.'' Referring Physician: Cyril Mourning, MD Patient Identification: Melissa Hayden MRN:  [id number] Principal Diagnosis: Acute metabolic encephalopathy Diagnosis:  Principal Problem:   Acute metabolic encephalopathy Active Problems:   Bipolar disorder, mixed (HCC)   Total Time spent with patient: 1 hour  Subjective:   Melissa Hayden is a 47 y.o. female patient admitted with slurred speech, dizziness and shortness of breath.  HPI:  78-year-old woman with Marfan's syndrome, brought in to the hospital as a code stroke. Patient reports history of Anticoagulant long-term use, Anxiety, Ascending aorta dilatation (HCC), Bipolar disorder (HCC), Depression, Migraine, Mitral valve prolapse, Palpitations and Varicose veins. She states that she receives her medications from Dr. Lolly Mustache at [facility] outpatient clinic. However, she ran out of medications 3 weeks ago and was unable to get her prescription renewed due to lack of money and no health insurance-she was unemployed. But patient reports that she recently got a new job and has been offered health insurance that will kick in on July 31 st, 2024. In the meantime, she is happy to re-start her medications at the hospital but requesting for her to be discharged home with enough medication for a month. Patient denies depression, mood swings, psychosis, delusions, drug use, self harming thoughts but anxious and apprehensions about her current medical condition  Past Psychiatric History: as above  Risk to Self:  denies  Risk to Others:  denies Prior Inpatient Therapy:  in the past Prior Outpatient Therapy:  Dr. Lolly Mustache, [facility] outpatient  Past Medical History:  Past Medical History:  Diagnosis Date   Anticoagulant long-term use    after valve surgery for Marfan's syndrome   Anxiety     Ascending aorta dilatation (HCC)    Bipolar disorder (HCC)    Depression    Marfan's syndrome    Dr. Beverely Pace   Migraine    Mitral valve prolapse    Palpitations    Suicidal behavior    Tylenol Overdose   Varicose veins     Past Surgical History:  Procedure Laterality Date   ANKLE SURGERY Left    BREAST CYST EXCISION     CARDIAC VALVE SURGERY  09/2014   AV, MV replacement, aortic root replacement (Dr Kizzie Bane at [facility])   ICD IMPLANT N/A 01/05/2018   Procedure: ICD IMPLANT;  Surgeon: Marinus Maw, MD;  Location: [facility] INVASIVE CV LAB;  Service: Cardiovascular;  Laterality: N/A;   INNER EAR SURGERY Right    Family History:  Family History  Problem Relation Age of Onset   Cancer Mother 12       Breast    Marfan syndrome Father    Heart disease Father    Marfan syndrome Sister    Marfan syndrome Paternal Grandmother    Marfan syndrome Sister    Family Psychiatric  History:  Social History:  Social History   Substance and Sexual Activity  Alcohol Use No     Social History   Substance and Sexual Activity  Drug Use No    Social History   Socioeconomic History   Marital status: Divorced    Spouse name: Not on file   Number of children: 0   Years of education: 16   Highest education level: Not on file  Occupational History   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  Off and on    Alcohol Use:  None   Drug Use:  None   Diet:   Regular   Exercise:  None   Hobbies:  Reading, Traveling             Social Determinants of Health   Financial Resource Strain: Not on file  Food Insecurity: Not on file  Transportation Needs: Not on file  Physical Activity: Not on file  Stress: Not on file  Social Connections: Not on file   Additional Social History:    Allergies:   Allergies  Allergen Reactions   Abilify [Aripiprazole] Other (See Comments)    "Flattened affect" "couldn't do anything while taking it"    Labs:  Results for orders placed or performed during the hospital encounter of 03/17/23 (from the past 48 hour(s))  Glucose, capillary     Status: None   Collection Time: 03/18/23  3:36 PM  Result Value Ref Range   Glucose-Capillary 74 70 - 99 mg/dL    Comment: Glucose reference range applies only to samples taken after fasting for at least 8 hours.  Heparin level (unfractionated)     Status: None   Collection Time: 03/18/23  5:32 PM  Result Value Ref Range   Heparin Unfractionated 0.33 0.30 - 0.70 IU/mL    Comment: (NOTE) The clinical reportable range upper limit is being lowered to >1.10 to align with the FDA approved guidance for the current laboratory assay.  If heparin results are below expected values, and patient dosage has  been confirmed, suggest follow up testing of antithrombin III levels. Performed at [facility], [address], [place], [place] [address]   Glucose, capillary     Status: Abnormal   Collection Time: 03/18/23  7:27 PM  Result Value Ref Range   Glucose-Capillary 69 (L) 70 - 99 mg/dL    Comment: Glucose reference range applies only to samples taken after fasting for at least 8 hours.  Glucose, capillary     Status: Abnormal   Collection Time: 03/18/23  8:11 PM  Result Value Ref Range   Glucose-Capillary 111 (H) 70 - 99 mg/dL    Comment: Glucose reference range applies only to samples taken after fasting for at least 8 hours.  Glucose, capillary     Status:  Abnormal   Collection Time: 03/18/23 11:32 PM  Result Value Ref Range   Glucose-Capillary 62 (L) 70 - 99 mg/dL    Comment: Glucose reference range applies only to samples taken after fasting for at least 8 hours.  Glucose, capillary     Status: Abnormal   Collection Time: 03/18/23 11:59 PM  Result Value Ref Range   Glucose-Capillary 130 (H) 70 - 99 mg/dL    Comment: Glucose reference range applies only to samples taken after fasting for at least 8  hours.  Glucose, capillary     Status: Abnormal   Collection Time: 03/19/23  3:14 AM  Result Value Ref Range   Glucose-Capillary 63 (L) 70 - 99 mg/dL    Comment: Glucose reference range applies only to samples taken after fasting for at least 8 hours.  Glucose, capillary     Status: Abnormal   Collection Time: 03/19/23  4:06 AM  Result Value Ref Range   Glucose-Capillary 124 (H) 70 - 99 mg/dL    Comment: Glucose reference range applies only to samples taken after fasting for at least 8 hours.  Heparin level (unfractionated)     Status: None   Collection Time: 03/19/23  4:07 AM  Result Value Ref Range   Heparin Unfractionated 0.35 0.30 - 0.70 IU/mL    Comment: (NOTE) The clinical reportable range upper limit is being lowered to >1.10 to align with the FDA approved guidance for the current laboratory assay.  If heparin results are below expected values, and patient dosage has  been confirmed, suggest follow up testing of antithrombin III levels. Performed at [facility], [address], [place], [place] [address]   Hepatic function panel     Status: Abnormal   Collection Time: 03/19/23  4:07 AM  Result Value Ref Range   Total Protein 5.3 (L) 6.5 - 8.1 g/dL   Albumin 2.9 (L) 3.5 - 5.0 g/dL   AST 35 15 - 41 U/L   ALT 15 0 - 44 U/L   Alkaline Phosphatase 32 (L) 38 - 126 U/L   Total Bilirubin 0.7 0.3 - 1.2 mg/dL   Bilirubin, Direct 0.1 0.0 - 0.2 mg/dL   Indirect Bilirubin 0.6 0.3 - 0.9 mg/dL    Comment: Performed at [facility], [address], [place], [place] [address]  Glucose, capillary     Status: None   Collection Time: 03/19/23  7:56 AM  Result Value Ref Range   Glucose-Capillary 83 70 - 99 mg/dL    Comment: Glucose reference range applies only to samples taken after fasting for at least 8 hours.  Basic metabolic panel     Status: Abnormal   Collection Time: 03/19/23  9:58 AM  Result Value Ref Range   Sodium 143 135 - 145 mmol/L   Potassium 3.0 (L) 3.5 - 5.1 mmol/L   Chloride 111 98 - 111 mmol/L   CO2 22 22 - 32 mmol/L   Glucose, Bld 84 70 - 99 mg/dL    Comment: Glucose reference range applies only to samples taken after fasting for at least 8 hours.   BUN 17 6 - 20 mg/dL   Creatinine, Ser 0.98 0.44 - 1.00 mg/dL   Calcium 8.0 (L) 8.9 - 10.3 mg/dL   GFR, Estimated >11 >91 mL/min    Comment: (NOTE) Calculated using the CKD-EPI Creatinine Equation (2021)    Anion gap 10 5 - 15    Comment: Performed at [facility], [address], [place], [place] [address]  Magnesium     Status: None   Collection Time: 03/19/23  9:58 AM  Result Value Ref Range   Magnesium 1.8 1.7 - 2.4 mg/dL    Comment: Performed at [facility], [address], [place], [place] [address]  Glucose, capillary     Status: None   Collection Time: 03/19/23 11:26 AM  Result Value Ref Range   Glucose-Capillary 83 70 - 99 mg/dL    Comment: Glucose reference range applies only to  samples taken after fasting for at least 8 hours.  Protime-INR     Status: None   Collection Time: 03/19/23  2:47 PM  Result Value Ref Range   Prothrombin Time 14.8 11.4 - 15.2 seconds   INR 1.1 0.8 - 1.2    Comment: (NOTE) INR goal varies based on device and disease states. Performed at [facility], [address] [place], [place] [address]   Glucose, capillary     Status: None   Collection Time: 03/19/23  4:09 PM  Result Value Ref Range   Glucose-Capillary 80 70 - 99 mg/dL    Comment: Glucose reference range applies only to  samples taken after fasting for at least 8 hours.  Heparin level (unfractionated)     Status: None   Collection Time: 03/20/23  1:48 AM  Result Value Ref Range   Heparin Unfractionated 0.34 0.30 - 0.70 IU/mL    Comment: (NOTE) The clinical reportable range upper limit is being lowered to >1.10 to align with the FDA approved guidance for the current laboratory assay.  If heparin results are below expected values, and patient dosage has  been confirmed, suggest follow up testing of antithrombin III levels. Performed at [facility], [address], [place], [place] [address]   Comprehensive metabolic panel     Status: Abnormal   Collection Time: 03/20/23  1:48 AM  Result Value Ref Range   Sodium 138 135 - 145 mmol/L   Potassium 2.8 (L) 3.5 - 5.1 mmol/L   Chloride 110 98 - 111 mmol/L   CO2 21 (L) 22 - 32 mmol/L   Glucose, Bld 128 (H) 70 - 99 mg/dL    Comment: Glucose reference range applies only to samples taken after fasting for at least 8 hours.   BUN 12 6 - 20 mg/dL   Creatinine, Ser 0.98 0.44 - 1.00 mg/dL   Calcium 8.1 (L) 8.9 - 10.3 mg/dL   Total Protein 5.3 (L) 6.5 - 8.1 g/dL   Albumin 3.0 (L) 3.5 - 5.0 g/dL   AST 25 15 - 41 U/L   ALT 15 0 - 44 U/L   Alkaline Phosphatase 32 (L) 38 - 126 U/L   Total Bilirubin 0.4 0.3 - 1.2 mg/dL   GFR, Estimated >11 >91 mL/min    Comment: (NOTE) Calculated using the CKD-EPI Creatinine Equation (2021)    Anion gap 7 5 - 15    Comment: Performed at [facility], [address], [place], [place] [address]  CBC     Status: Abnormal   Collection Time: 03/20/23  1:48 AM  Result Value Ref Range   WBC 4.0 4.0 - 10.5 K/uL   RBC 3.73 (L) 3.87 - 5.11 MIL/uL   Hemoglobin 10.5 (L) 12.0 - 15.0 g/dL   HCT 56.2 (L) 13.0 - 86.5 %   MCV 87.9 80.0 - 100.0 fL   MCH 28.2 26.0 - 34.0 pg   MCHC 32.0 30.0 - 36.0 g/dL   RDW 78.4 69.6 - 29.5 %   Platelets 204 150 - 400 K/uL   nRBC 0.0 0.0 - 0.2 %    Comment: Performed at [facility], [address], [place], [place] [address]  Protime-INR     Status: None   Collection Time: 03/20/23  1:48 AM  Result Value Ref Range   Prothrombin Time 15.0 11.4 - 15.2 seconds   INR 1.2 0.8 - 1.2    Comment: (NOTE) INR goal varies based  on device and disease states. Performed at [facility], [address], [place], [place] [address]   Ammonia     Status: None   Collection Time: 03/20/23 10:22 AM  Result Value Ref Range   Ammonia 30 9 - 35 umol/L    Comment: Performed at [facility], [address], [place], [place] [address]  TSH     Status: Abnormal   Collection Time: 03/20/23 10:22 AM  Result Value Ref Range   TSH 7.074 (H) 0.350 - 4.500 uIU/mL    Comment: Performed by a 3rd Generation assay with a functional sensitivity of <=0.01 uIU/mL. Performed at [facility], [address], [place], [place] [address]   Vitamin B12     Status: None   Collection Time: 03/20/23 10:22 AM  Result Value Ref Range   Vitamin B-12 648 180 - 914 pg/mL    Comment: (NOTE) This assay is not validated for testing neonatal or myeloproliferative syndrome specimens for Vitamin B12 levels. Performed at [facility], [address] [place], [place] [address]   Folate     Status: None   Collection Time: 03/20/23 10:22 AM  Result Value Ref Range   Folate 12.8 >5.9 ng/mL    Comment: Performed at [facility], [address] [place], [place] [address]    Current Facility-Administered Medications  Medication Dose Route Frequency Provider Last Rate Last Admin   0.9 %  sodium chloride infusion  250 mL Intravenous Continuous Oretha Milch, MD       acetaminophen (TYLENOL) tablet 500 mg  500 mg Oral Q4H PRN Lynnell Catalan, MD   500 mg at 03/20/23 0504   Chlorhexidine Gluconate Cloth 2 % PADS 6 each  6 each Topical Daily Oretha Milch, MD   6 each at 03/20/23 0826   citalopram (CELEXA) tablet 20 mg  20 mg Oral Daily Oretha Milch, MD   20 mg at 03/20/23 0827    dextrose 10 % infusion   Intravenous Continuous Oretha Milch, MD 40 mL/hr at 03/20/23 0328 New Bag at 03/20/23 0328   docusate sodium (COLACE) capsule 100 mg  100 mg Oral BID PRN Oretha Milch, MD       heparin ADULT infusion 100 units/mL (25000 units/258mL)  1,500 Units/hr Intravenous Continuous Oretha Milch, MD 15 mL/hr at 03/20/23 0806 1,500 Units/hr at 03/20/23 0806   multivitamin with minerals tablet 1 tablet  1 tablet Oral Daily Oretha Milch, MD   1 tablet at 03/20/23 0827   nicotine (NICODERM CQ - dosed in mg/24 hours) patch 14 mg  14 mg Transdermal Daily Cyril Mourning V, MD   14 mg at 03/19/23 1009   OLANZapine (ZYPREXA) tablet 20 mg  20 mg Oral QHS Oretha Milch, MD   20 mg at 03/19/23 2333   Oral care mouth rinse  15 mL Mouth Rinse 4 times per day Oretha Milch, MD   15 mL at 03/19/23 2233   Oral care mouth rinse  15 mL Mouth Rinse PRN Oretha Milch, MD       polyethylene glycol (MIRALAX / GLYCOLAX) packet 17 g  17 g Oral Daily PRN Oretha Milch, MD       potassium chloride SA (KLOR-CON M) CR tablet 40 mEq  40 mEq Oral TID Alberteen Sam, MD   40 mEq at 03/20/23 0827   warfarin (COUMADIN) tablet 5 mg  5 mg Oral  ONCE-1600 Paytes, Florentina Addison, RPH       Warfarin - Pharmacist Dosing Inpatient   Does not apply q1600 Oretha Milch, MD        Musculoskeletal: Strength & Muscle Tone: within normal limits Gait & Station: normal Patient leans: N/A     Psychiatric Specialty Exam:  Presentation  General Appearance:  Appropriate for Environment  Eye Contact: Good  Speech: slow, slurred  Speech Volume: decreased Handedness: Right   Mood and Affect  Mood: Dysphoric  Affect: Appropriate   Thought Process  Thought Processes: Linear; Coherent  Descriptions of Associations:Intact  Orientation:Full (Time, Place and Person)  Thought Content:Logical  History of Schizophrenia/Schizoaffective disorder:No data recorded Duration of Psychotic Symptoms:No data  recorded Hallucinations:Hallucinations: None  Ideas of Reference:None  Suicidal Thoughts:Suicidal Thoughts: No  Homicidal Thoughts:Homicidal Thoughts: No   Sensorium  Memory: Immediate Good; Recent Good; Remote Good  Judgment: Intact  Insight: Good   Executive Functions  Concentration: Good  Attention Span: Good  Recall: Good  Fund of Knowledge: Good  Language: Good   Psychomotor Activity  Psychomotor Activity: Psychomotor Activity: Normal   Assets  Assets: Communication Skills; Desire for Improvement; Social Support   Sleep  Sleep: Sleep: Fair   Physical Exam: Physical Exam Review of Systems  Psychiatric/Behavioral:  Negative for depression, hallucinations, memory loss, substance abuse and suicidal ideas. The patient is not nervous/anxious and does not have insomnia.    Blood pressure 118/64, pulse (!) 51, temperature 97.7 F (36.5 C), resp. rate 17, height 6\' 2"  (1.88 m), weight 76.1 kg, SpO2 97 %. Body mass index is 21.54 kg/m.  Treatment Plan Summary: 47 y/o female with history of Bipolar disorder and multiple medical issues, she was admitted for evaluation and treatment of stroke-like symptoms. Also, patient reports having financial issues which has prevented her from being able to make co-pay for her mental health medications. However, she recently got a new job who offered her health insurance that will kick in on July 31 st.   Recommendations: -Continue Olanzapine 20 mg qhs and Celexa 20 mg daily -Consider discharging patient home with one month supply of Olanzapine and Celexa for continuity of care until her insurance kick in July 31 st -Patient has been educated on the need to use alternative pharmacies like [facility] to get her medications cheap -Please refer patient to Dr, Lolly Mustache at [facility] outpatient clinic after discharge  Disposition: No evidence of imminent risk to self or others at present.   Patient does not meet criteria for  psychiatric inpatient admission. Supportive therapy provided about ongoing stressors. Psychiatric consult service signing out. Re-consult as needed  Thedore Mins, MD 03/20/2023 11:45 AM

## 2023-03-20 NOTE — Progress Notes (Addendum)
Patient alert and oriented, able to follow the instruction. Restraint order discontinued. Skin assessment done with no injuries.Informed PCCM night oncall MD. Patient started with regular diet.  03/19/23 2200  Restraint Every 2 Hour Monitoring  Airway Clear with Spontaneous Respirations Yes  Circulation / Skin Integrity No signs of injury/restraints applied correctly  Emotional / Mental Status Patient calm/resting  Range of Motion Performed  Food and Fluids NPO  Elimination Urethral catheter  Patient's rights, dignity, safety maintained Yes  Can Restraints be Less Restrictive or Discontinued? Yes (Complete full restraint assessment)

## 2023-03-20 NOTE — Plan of Care (Signed)
  Problem: Activity: Goal: Risk for activity intolerance will decrease Outcome: Progressing   Problem: Nutrition: Goal: Adequate nutrition will be maintained Outcome: Progressing   Problem: Coping: Goal: Level of anxiety will decrease Outcome: Progressing   

## 2023-03-20 NOTE — Hospital Course (Addendum)
Melissa Hayden is a 47 y.o. F with Marfan's syndrome, hx MVR and AVR on warfarin, hx NICM/sCHF EF30-35% recovered to 50%, and depression who presented with 3 weeks confusion.  On admission, the patient was progressively delirious and required Precedex and admission to the ICU.  She was ultimately able to wean off Precedex and able to give coherent history that her symptoms were dysarthria and dizziness starting 2-3 weeks PTA.  These symptoms were initially severe, but improved slightly and she didn't seek care because she was afraid, but eventually developed severe headache that prompted her to come to the ER. Underwent MRI and Neurology evaluation.  **Her mentation is much improved now but now hospitalization has been complicated by significant orthostatic hypotension to the point where she gets near syncopal.  Blood pressure dropped significantly still and patient did not want to be hospitalized anymore so she signed out AGAINST MEDICAL ADVICE when I told her that I felt that she would not medically stable.  She understands the risk of leaving AGAINST MEDICAL ADVICE including worsening, decompensation and possible death.  As a courtesy I sent her the scripts to the [facility] pharmacy despite her signing out AGAINST MEDICAL ADVICE.  Assessment and Plan:  Acute Metabolic Encephalopathy, improved -Unclear precipitant.   -She was disoriented, not paranoid, hallucinating.  She denies alcohol use or other drugs.   -She was treated for infection here initially, but had no fever, leukocytosis, abx were stopped and she has had no clinical change. -Mental status appears to be back to baseline, intermittently patient does have some dysarthria. -Evaluated with an MRI which showed small foci of susceptibility artifact in the high left frontoparietal region and right cerebral hemisphere which may reflect chronic microhemorrhages or small cavernoma's but no acute stroke or subacute stroke identified. -Consulted  Neurology, appreciate recommendations, advised outpatient follow-up MRI and to continue Coumadin.  Dysarthria -There is no tongue swelling.   -She reported that this started several weeks prior to admission (not the night before as some notes indicate).  It was initially more severe, then improved a little, but is still present. -Patient reports intermittent dysarthria nurse also test the same.  No difficulty with swallowing. -Evaluated with an MRI which showed small foci of susceptibility artifact in the high left frontoparietal region and right cerebral hemisphere which may reflect chronic microhemorrhages or small cavernoma's but no acute stroke or subacute stroke identified. -Consulted Neurology and appreciated recommendations, advised outpatient follow-up MRI and to continue Coumadin.  Elevated TSH -Check Free T4 this is pending -TSH was 7.074 -Started on levothyroxine but will need follow-up endocrinology follow-up   Renal Infarct -There was initial concern that this represented infection, but UA normal, UCx no growth.  Suspect small infarct due to missed warfarin. -Renal U/S done and showed "No collecting system dilatation. Subtle area of echogenicity in the upper pole of the right kidney corresponding to the finding by prior CT. If needed for follow up a postcontrast CT may provide further sensitivity when clinically appropriate." -Continue warfarin -Continue Lovenox bridge -INR is still subtherapeutic, Coumadin dosed by pharmacy   History of aortic valve and mitral valve replacement in the setting of Marfan syndrome -Had run out of warfarin a few weeks prior to admission due to financial issues (lost car, couldn't get to pharmacy) -Continue Warfarin -Changed Heparin bridge to Enoxaparin  -Pharmacy dosing warfarin, INR still subtherapeutic but improving as PT/INR was 21.6/1.9   Dizziness and Orthostatic Hypotension, persistent -Secondary to orthostasis from dehydration -IV fluid  bolus  ordered and given 1 Liter the day before yesterday and 500 mL x2 again today and will add midodrine -Order TED Hose and Abdominal Binder -Patient's BP dropped from 104/62 supine -> 86/64 sitting and then went to 80/44 standing  -Midodrine restarted yesterday and we will uptitrate and cortisol level was obtained however was not drawn at 3 AM it was 3.1 -Patient was frustrated about being hospitalized and did not want to be hospitalized anymore so she left AGAINST MEDICAL ADVICE   Hypokalemia, improved -Patient's K+ Level Trend: Recent Labs  Lab 03/19/23 0958 03/20/23 0148 03/21/23 0233 03/22/23 0809 03/23/23 0104 03/24/23 0339 03/25/23 0151  K 3.0* 2.8* 3.4* 3.5 4.1 3.6 3.4*  -Replete with p.o. potassium chloride 40 mill colons twice daily x 2 doses but patient then signed out AMA -Continue to Monitor and Replete as Necessary -Repeat CMP in the AM   Depression and History of Bipolar Disorder -Continue Citalopram 20 mg po Daily and Olanzapine 20 mg po qHS -Psychiatry was consulted and recommending referring the patient to Dr. Lolly Mustache at [facility] outpatient clinic after discharge and discharge the patient with 1 month supply of olanzapine and Celexa for continuity of care  Normocytic Anemia -Hgb/Hct Trend: Recent Labs  Lab 03/18/23 0044 03/20/23 0148 03/21/23 0233 03/22/23 0809 03/23/23 0104 03/24/23 0339 03/25/23 0151  HGB 9.8* 10.5* 11.3* 12.0 11.7* 11.8* 11.5*  HCT 31.3* 32.8* 35.8* 37.3 37.3 37.2 35.0*  MCV 87.4 87.9 89.5 88.8 87.4 87.5 88.2  -Check Anemia Panel in the AM -Continue to Monitor for S/Sx of Bleeding given that patient is on Coumadin and on a Lovenox Bridge; No overt bleeding noted -Repeat CBC in the AM   Chronic Systolic Congestive Heart Failure -Appears compensated.   -Not on diuretics at home.  EF this admission recovered to 50-55%.   -Strict I's and O's and Daily Weights No intake or output data in the 24 hours ending 03/26/23 1823 -Patient is -11.841  liters since Admit -Continue to Monitor for S/Sx of Volume Overload  Hypoalbuminemia -Patient's Albumin Trend: Recent Labs  Lab 03/17/23 1651 03/19/23 0407 03/20/23 0148 03/22/23 0809 03/23/23 0104 03/24/23 0339 03/25/23 0151  ALBUMIN 3.0* 2.9* 3.0* 3.0* 3.0* 2.9* 2.8*  -Continue to Monitor and Trend and repeat CMP in the AM

## 2023-03-20 NOTE — Evaluation (Signed)
Speech Language Pathology Evaluation Patient Details Name: Melissa Hayden MRN: [id number] DOB: 02/14/1976 Today's Date: 03/20/2023 Time: 0840-0900 SLP Time Calculation (min) (ACUTE ONLY): 20 min  Problem List:  Patient Active Problem List   Diagnosis Date Noted   Acute metabolic encephalopathy 03/17/2023   Syncope and collapse 01/05/2018   Chronic systolic (congestive) heart failure (HCC) 01/05/2018   Nonischemic cardiomyopathy (HCC) 11/16/2017   Irregular cardiac rhythm 11/07/2017   Visit for preventive health examination 11/07/2017   Breast cancer screening 11/07/2017   History of syncope 11/03/2017   Multifocal PVCs with pairing 11/03/2017   Long term (current) use of anticoagulants 10/03/2017   History of aortic stenosis 03/18/2017   Suicide attempt by drug ingestion (HCC) 03/18/2017   Insomnia 12/12/2014   Marfan's disease 06/13/2014   Chronic venous insufficiency 06/13/2014   Spinal cord cysts 06/13/2014   Bipolar disorder, mixed (HCC) 02/10/2014   Past Medical History:  Past Medical History:  Diagnosis Date   Anticoagulant long-term use    after valve surgery for Marfan's syndrome   Anxiety    Ascending aorta dilatation (HCC)    Bipolar disorder (HCC)    Depression    Marfan's syndrome    Dr. Beverely Pace   Migraine    Mitral valve prolapse    Palpitations    Suicidal behavior    Tylenol Overdose   Varicose veins    Past Surgical History:  Past Surgical History:  Procedure Laterality Date   ANKLE SURGERY Left    BREAST CYST EXCISION     CARDIAC VALVE SURGERY  09/2014   AV, MV replacement, aortic root replacement (Dr Kizzie Bane at [facility])   ICD IMPLANT N/A 01/05/2018   Procedure: ICD IMPLANT;  Surgeon: Marinus Maw, MD;  Location: [facility] INVASIVE CV LAB;  Service: Cardiovascular;  Laterality: N/A;   INNER EAR SURGERY Right    HPI:  47 year old chronically ill-appearing female patient is brought in by EMS on 6/6 for slurred speech confusion, and dizziness.  Last seen  normal earlier that evening.  No fever cough rashes chest pain.  Reported headache earlier in the evening.  Was able to provide initial history. Was added in as a code stroke, became progressively confused while in the emergency room CT head was negative for acute findings, there were arterial manifestations of connective tissue disease with right ICA beading and left ICA fusiform aneurysm, the CT chest was negative for aortic dissection there is a small wedge-shaped region in the upper pole of the right kidney there was a 3.1 cm suprarenal aortic aneurysm. Now c/o delayed swallow sensation, slowed processing, difficulty finding words.   Assessment / Plan / Recommendation Clinical Impression  Melissa Hayden presents with a mild-moderate cognitive-communication deficit with aphasia as well as dysarthria. Speech is generally intelligible, but lacks precision. She reports feeling "lispy." Cognitively, she is notably slowed in processing speeds, which may be r/t auditory comprehension/language versus true cognition; difficult to discern. Divergent naming was moderately impaired, confrontation naming was mildly impaired, but characterized by semantic paraphasias and perseveration. She completed verbal sequencing tasks with frequent use of circumlocution and word fillers ("thingy"). Auditory comprehension is impaired and characterized by slowed response, request for repetition, difficulty following multi-step or complex directions. She generally appears aware of her errors and is frustrated by them. As SLP was leaving, she asked for "a pen for her hair" and made a combing gesture. 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 Patient benefits from clarifying questions, repetition of directions, slowed speech. She is expected to benefit  from ongoing ST on acute level and in outpatient once discharged. There are some concerns for safety at home, based on suspected cognitive deficits. She would benefit from home assist upon discharge.    SLP Assessment  SLP Recommendation/Assessment: Patient needs continued Speech Lanaguage Pathology Services SLP Visit Diagnosis: Aphasia (R47.01);Cognitive communication deficit (R41.841)    Recommendations for follow up therapy are one component of a multi-disciplinary discharge planning process, led by the attending physician.  Recommendations may be updated based on patient status, additional functional criteria and insurance authorization.    Follow Up Recommendations  Outpatient SLP    Assistance Recommended at Discharge  PRN  Functional Status Assessment Patient has had a recent decline in their functional status and demonstrates the ability to make significant improvements in function in a reasonable and predictable amount of time.  Frequency and Duration min 1 x/week  2 weeks      SLP Evaluation Cognition  Overall Cognitive Status: Impaired/Different from baseline Orientation Level: Oriented X4 Awareness: Appears intact Problem Solving: Appears intact Executive Function: Sequencing;Initiating;Self Correcting Sequencing: Impaired Initiating: Impaired Self Correcting: Impaired Safety/Judgment: Appears intact       Comprehension  Auditory Comprehension Overall Auditory Comprehension: Impaired Commands: Impaired Complex Commands: 75-100% accurate Interfering Components: Processing speed EffectiveTechniques: Extra processing time;Slowed speech    Expression Expression Primary Mode of Expression: Verbal Verbal Expression Overall Verbal Expression: Impaired Level of Generative/Spontaneous Verbalization: Conversation Repetition: No impairment Naming: Impairment Responsive: 51-75% accurate Confrontation: Impaired Convergent: 50-74% accurate Divergent: 25-49%  accurate Verbal Errors: Phonemic paraphasias;Perseveration;Aware of errors   Oral / Motor  Oral Motor/Sensory Function Overall Oral Motor/Sensory Function: Within functional limits Motor Speech Overall Motor Speech: Impaired Respiration: Within functional limits Phonation: Normal Resonance: Within functional limits Articulation: Impaired Level of Impairment: Phrase Intelligibility: Intelligibility reduced           Ledell Codrington P. Neidy Guerrieri, M.S., CCC-SLP Speech-Language Pathologist Acute Rehabilitation Services Pager: [phone]  Susanne Borders Treasure Ingrum 03/20/2023, 9:04 AM

## 2023-03-20 NOTE — Evaluation (Signed)
Physical Therapy Evaluation Patient Details Name: Melissa Hayden MRN: [id number] DOB: [birth year] Today's Date: 03/20/2023  History of Present Illness  47 year old chronically ill-appearing female patient is brought in by EMS on 6/6 for slurred speech confusion, and dizziness; acute metabolic encephalopathy and metabolic acidosis  Clinical Impression  Pt admitted with above diagnosis. Lives at home alone (with 3 dogs), in a single-level home with 5 steps to enter; Prior to admission, pt was independent; Presents to PT with generalized weakness, decr activity tolerance, posssible orthostatic hypotension; Min assist to get to EOB; light mod assist to power up to stand and take pivot steps bed to recliner with RW; very dizzy upon initial stand; plan for orthostatics next session;  Pt currently with functional limitations due to the deficits listed below (see PT Problem List). Pt will benefit from skilled PT to increase their independence and safety with mobility to allow discharge to the venue listed below.          Recommendations for follow up therapy are one component of a multi-disciplinary discharge planning process, led by the attending physician.  Recommendations may be updated based on patient status, additional functional criteria and insurance authorization.  Follow Up Recommendations       Assistance Recommended at Discharge Set up Supervision/Assistance  Patient can return home with the following  Assistance with cooking/housework;A little help with walking and/or transfers    Equipment Recommendations Rolling walker (2 wheels);BSC/3in1 (will continue to discern if pt nees RW over next few PT sessions)  Recommendations for Other Services  OT consult (ordered per protocol)    Functional Status Assessment Patient has had a recent decline in their functional status and demonstrates the ability to make significant improvements in function in a reasonable and predictable amount of time.      Precautions / Restrictions Precautions Precautions: Fall      Mobility  Bed Mobility Overal bed mobility: Needs Assistance Bed Mobility: Supine to Sit     Supine to sit: Min assist     General bed mobility comments: min handheld assist to pull to sit    Transfers Overall transfer level: Needs assistance Equipment used: Rolling walker (2 wheels) Transfers: Sit to/from Stand, Bed to chair/wheelchair/BSC Sit to Stand: Mod assist   Step pivot transfers: Min assist       General transfer comment: Light mod assist to power up to stand; very dizzy at initial stand, and assisted pt to recliner with pivot steps using RW; tactile  directional cues needed    Ambulation/Gait               General Gait Details: Deferred due to lightheadedness  Stairs            Wheelchair Mobility    Modified Rankin (Stroke Patients Only) Modified Rankin (Stroke Patients Only) Pre-Morbid Rankin Score: No symptoms Modified Rankin: Moderate disability     Balance Overall balance assessment: Needs assistance   Sitting balance-Leahy Scale: Fair       Standing balance-Leahy Scale: Poor                               Pertinent Vitals/Pain Pain Assessment Pain Assessment: No/denies pain    Home Living Family/patient expects to be discharged to:: Private residence Living Arrangements: Alone Available Help at Discharge: Family;Other (Comment) (cousin, limited time to be abl eot assist) Type of Home: House Home Access: Stairs to enter Entrance Stairs-Rails: None Entrance [employer]  of Steps: 5   Home Layout: One level Home Equipment: None      Prior Function Prior Level of Function : Independent/Modified Independent                     Hand Dominance        Extremity/Trunk Assessment   Upper Extremity Assessment Upper Extremity Assessment: Generalized weakness    Lower Extremity Assessment Lower Extremity Assessment: Generalized  weakness    Cervical / Trunk Assessment Cervical / Trunk Assessment: Normal  Communication   Communication: Expressive difficulties;Other (comment) (see also SLP eval)  Cognition Arousal/Alertness: Awake/alert Behavior During Therapy: WFL for tasks assessed/performed Overall Cognitive Status: Impaired/Different from baseline Area of Impairment: Memory                               General Comments: Pt reports she remembers falling now; Amnestic to falling upon admission        General Comments General comments (skin integrity, edema, etc.): Will plan to get othostatic BPs next session    Exercises     Assessment/Plan    PT Assessment Patient needs continued PT services  PT Problem List Decreased strength;Decreased activity tolerance;Decreased balance;Decreased mobility;Decreased coordination;Decreased cognition;Decreased knowledge of use of DME;Decreased safety awareness;Decreased knowledge of precautions       PT Treatment Interventions DME instruction;Gait training;Stair training;Functional mobility training;Therapeutic activities;Therapeutic exercise;Balance training;Patient/family education;Neuromuscular re-education;Cognitive remediation    PT Goals (Current goals can be found in the Care Plan section)  Acute Rehab PT Goals Patient Stated Goal: get back to her dogs PT Goal Formulation: With patient Time For Goal Achievement: 04/03/23 Potential to Achieve Goals: Good    Frequency Min 4X/week     Co-evaluation               AM-PAC PT "6 Clicks" Mobility  Outcome Measure Help needed turning from your back to your side while in a flat bed without using bedrails?: None Help needed moving from lying on your back to sitting on the side of a flat bed without using bedrails?: A Little Help needed moving to and from a bed to a chair (including a wheelchair)?: A Little Help needed standing up from a chair using your arms (e.g., wheelchair or bedside  chair)?: A Lot Help needed to walk in hospital room?: A Lot Help needed climbing 3-5 steps with a railing? : A Lot 6 Click Score: 16    End of Session Equipment Utilized During Treatment: Gait belt Activity Tolerance: Other (comment) (very dizzy in standing) Patient left: in chair;with call bell/phone within reach;with chair alarm set Nurse Communication: Mobility status PT Visit Diagnosis: Unsteadiness on feet (R26.81);Other abnormalities of gait and mobility (R26.89)    Time: 1610-9604 PT Time Calculation (min) (ACUTE ONLY): 24 min   Charges:   PT Evaluation $PT Eval Moderate Complexity: 1 Mod PT Treatments $Therapeutic Activity: 8-22 mins        Van Clines, PT  Acute Rehabilitation Services Office [phone] Secure Chat welcomed   Melissa Hayden 03/20/2023, 4:42 PM

## 2023-03-20 NOTE — Plan of Care (Signed)
  Problem: Education: Goal: Knowledge of General Education information will improve Description: Including pain rating scale, medication(s)/side effects and non-pharmacologic comfort measures Outcome: Progressing   Problem: Activity: Goal: Risk for activity intolerance will decrease 03/20/2023 2011 by Geralynn Ochs, RN Outcome: Progressing 03/20/2023 0703 by Geralynn Ochs, RN Outcome: Progressing   Problem: Nutrition: Goal: Adequate nutrition will be maintained 03/20/2023 2011 by Geralynn Ochs, RN Outcome: Progressing 03/20/2023 0703 by Geralynn Ochs, RN Outcome: Progressing   Problem: Coping: Goal: Level of anxiety will decrease 03/20/2023 2011 by Geralynn Ochs, RN Outcome: Progressing 03/20/2023 0703 by Geralynn Ochs, RN Outcome: Progressing

## 2023-03-21 ENCOUNTER — Inpatient Hospital Stay (HOSPITAL_COMMUNITY): Payer: Medicaid Other

## 2023-03-21 LAB — CBC
HCT: 35.8 % — ABNORMAL LOW (ref 36.0–46.0)
Hemoglobin: 11.3 g/dL — ABNORMAL LOW (ref 12.0–15.0)
MCH: 28.3 pg (ref 26.0–34.0)
MCHC: 31.6 g/dL (ref 30.0–36.0)
MCV: 89.5 fL (ref 80.0–100.0)
Platelets: 223 10*3/uL (ref 150–400)
RBC: 4 MIL/uL (ref 3.87–5.11)
RDW: 14.8 % (ref 11.5–15.5)
WBC: 4.4 10*3/uL (ref 4.0–10.5)
nRBC: 0 % (ref 0.0–0.2)

## 2023-03-21 LAB — BASIC METABOLIC PANEL
Anion gap: 9 (ref 5–15)
BUN: 5 mg/dL — ABNORMAL LOW (ref 6–20)
CO2: 21 mmol/L — ABNORMAL LOW (ref 22–32)
Calcium: 8.1 mg/dL — ABNORMAL LOW (ref 8.9–10.3)
Chloride: 111 mmol/L (ref 98–111)
Creatinine, Ser: 0.59 mg/dL (ref 0.44–1.00)
GFR, Estimated: >60 mL/min (ref >60)
Glucose, Bld: 86 mg/dL (ref 70–99)
Potassium: 3.4 mmol/L — ABNORMAL LOW (ref 3.5–5.1)
Sodium: 141 mmol/L (ref 135–145)

## 2023-03-21 LAB — PROTIME-INR
INR: 1.1 (ref 0.8–1.2)
Prothrombin Time: 14.1 seconds (ref 11.4–15.2)

## 2023-03-21 LAB — HEPARIN LEVEL (UNFRACTIONATED): Heparin Unfractionated: 0.34 IU/mL (ref 0.30–0.70)

## 2023-03-21 MED ORDER — WARFARIN SODIUM 7.5 MG PO TABS
7.5000 mg | ORAL_TABLET | Freq: Once | ORAL | Status: AC
  Administered 2023-03-21: 7.5 mg via ORAL
  Filled 2023-03-21: qty 1

## 2023-03-21 MED ORDER — POTASSIUM CHLORIDE 20 MEQ PO PACK
40.0000 meq | PACK | Freq: Two times a day (BID) | ORAL | Status: AC
  Administered 2023-03-21 (×2): 40 meq via ORAL
  Filled 2023-03-21 (×2): qty 2

## 2023-03-21 NOTE — Progress Notes (Signed)
ANTICOAGULATION CONSULT NOTE - Follow-up Consult  Pharmacy Consult for heparin + warfarin Indication: aortic and mitral valve replacements setting of Marfan syndrome   Allergies  Allergen Reactions   Abilify [Aripiprazole] Other (See Comments)    "Flattened affect" "couldn't do anything while taking it"    Patient Measurements: Height: 6\' 2"  (188 cm) Weight: 73.2 kg (161 lb 6.4 oz) IBW/kg (Calculated) : 77.7 Heparin Dosing Weight: 70 kg  Vital Signs: Temp: 97.6 F (36.4 C) (06/10 0510) BP: 142/81 (06/10 0510) Pulse Rate: 57 (06/10 0510)  Labs: Recent Labs    03/19/23 0407 03/19/23 0958 03/19/23 1447 03/20/23 0148 03/21/23 0233  HGB  --   --   --  10.5* 11.3*  HCT  --   --   --  32.8* 35.8*  PLT  --   --   --  204 223  LABPROT  --   --  14.8 15.0 14.1  INR  --   --  1.1 1.2 1.1  HEPARINUNFRC 0.35  --   --  0.34 0.34  CREATININE  --  0.67  --  0.62 0.59     Estimated Creatinine Clearance: 101.5 mL/min (by C-G formula based on SCr of 0.59 mg/dL).   Medical History: Past Medical History:  Diagnosis Date   Anticoagulant long-term use    after valve surgery for Marfan's syndrome   Anxiety    Ascending aorta dilatation (HCC)    Bipolar disorder (HCC)    Depression    Marfan's syndrome    Dr. Beverely Pace   Migraine    Mitral valve prolapse    Palpitations    Suicidal behavior    Tylenol Overdose   Varicose veins     Assessment: 47 yo female with history of aortic valve replacement setting of Marfan syndrome admitted with AMS. Patient previously on warfarin; however, has not taken in 3 months due to trouble obtaining medication. Pharmacy consulted to dose IV heparin and restart warfarin while on heparin bridge.   Last documented (2023) PTA regimen: warfarin 5mg  Monday, Wednesday, Friday, Saturday, Sunday and 7.5mg  on Tuesday, Thursday.  Heparin level 0.34 (therapeutic) with heparin running at 1500 units/hr. INR remains subtherapeutic this AM at 1.1. CBC remains  stable.   Goal of Therapy:  Heparin level 0.3-0.7 units/ml INR goal 2.5-3.5 Monitor platelets by anticoagulation protocol: Yes   Plan:  Continue heparin at 1,500 units/hr Warfarin 7.5 mg PO x1 - boosted dose to help augment INR Check heparin level, INR, and CBC daily Monitor for s/sx of bleeding Patient would benefit from [facility] pharmacy at discharge  Rexford Maus, PharmD, BCPS 03/21/2023 7:11 AM

## 2023-03-21 NOTE — Progress Notes (Signed)
PT Cancellation Note  Patient Details Name: Melissa Hayden MRN: [id number] DOB: 1976/05/24   Cancelled Treatment:    Reason Eval/Treat Not Completed: Patient at procedure or test/unavailable  Off the floor at MRI;   Will follow up later today as time allows;  Otherwise, will follow up for PT tomorrow;   Thank you,  Van Clines, PT  Acute Rehabilitation Services Office [phone]    [name] 03/21/2023, 1:13 PM

## 2023-03-21 NOTE — Progress Notes (Signed)
Progress Note   Patient: Melissa Hayden ZOX:[id number] DOB: 1976/02/24 DOA: 03/17/2023     4 DOS: the patient was seen and examined on 03/21/2023 at 8:49AM      Brief hospital course: Mrs. Reinheimer is a 47 y.o. F with Marfan's syndrome, hx MVR and AVR on warfarin, hx NICM/sCHF EF30-35% recovered to 50%, and depression who presented with 3 weeks confusion.  On admission, the patient was progressively delirious and required Precedex and admission to the ICU.  She was ultimately able to wean off Precedex and able to give coherent history that her symptoms were dysarthria and dizziness starting 2-3 weeks PTA.  These symptoms were initially severe, but improved slightly and she didn't seek care because she was afraid, but eventually developed severe headache that prompted her to come to the ER.     Assessment and Plan: Acute metabolic encephalopathy Unclear precipitant.  She was disoriented, not paranoid, hallucinating.  She denies alcohol use or other drugs.  She was treated for infection here initially, but had no fever, leukocytosis, abx were stopped and she has had no clinical change. - Mental status appears to be back to baseline, intermittently patient does have some dysarthria. - Obtain MRI brain, ordered pending - Consult Neurology, appreciate recommendations    Dysarthria There is no tongue swelling.  To me she reports that this started several weeks prior to admission (not the night before as some notes indicate).  It was initially more severe, then improved a little, but is still present. - Patient reports intermittent dysarthria nurse also test the same.  No difficulty with swallowing. - Obtain MRI brain, pending - Consult Neurology    Renal infarct There was initial concern that this represented infection, but UA normal, UCx no growth.  Suspect small infarct due to missed warfarin. - Continue warfarin - Continue heparin bridge    History of aortic valve and mitral valve  replacement in the setting of Marfan syndrome Had run out of warfarin a few weeks prior to admission due to financial issues (lost car, couldn't get to pharmacy) - Continue warfarin - Continue heparin bridge - Pharmacy dosing warfarin, INR still subtherapeutic.   Anemia, normocytic Hgb stable, no clinical bleeding.  Hypokalemia - Supplement K  Depression - Continue Celexa, olanzapine  Chronic systolic congestive heart failure Appears compensated.  Not on diuretics at home.  EF this admission recovered to 50-55%.           Subjective: Patient seen and examined at bedside today.  Patient still has some mild dysarthria which is intermittent, mental status however is back to baseline is able to answer questions and keep fairly normal conversation.       Physical Exam: BP 122/79 (BP Location: Left Arm)   Pulse (!) 59   Temp 98.1 F (36.7 C)   Resp 18   Ht 6\' 2"  (1.88 m)   Wt 73.2 kg   SpO2 94%   BMI 20.72 kg/m   Thin adult female, lying in bed, interactive and appropriate RRR, soft systolic murmur, no peripheral edema No JVD Respiratory rate normal, lungs clear without rales or wheezes Abdomen soft without tenderness palpation or guarding Pupils are equal and reactive.  Extraocular movements are intact, without nystagmus.  Cranial nerve 5 is within normal limits.  Cranial nerve 7 is symmetrical.  Cranial nerve 8 is within normal limits.  Cranial nerves 9 and 10 reveal equal palate elevation.  Cranial nerve 11 reveals sternocleidomastoid strong.  Cranial nerve 12 is midline.  Motor strength testing is 5/5 in the upper and lower extremities bilaterally with normal motor, tone and bulk. Finger-to-nose testing is within normal limits. The patient is oriented to time, place and person.  Speech is dysarthric.  Naming is moderately impaired (only able to name 2 farm animals in 1 minute).  Short term memory also suspect is slightly impaired.    Attention span and concentration are  within normal limits.      Data Reviewed: Discussed with Neurology by secure chat BMP shows hypokalemia, normal renal function CBC shows mild anemia, stable       Disposition: Status is: Inpatient Admitted for encephalopathy.  The encephalopathy has cleared but she has dysarthria which is unusual.  Needs MRI brain and Neurology consults.    If all reassuring, possibly home in the next day or two         Author: Harold Hedge, MD 03/21/2023 12:35 PM  For on call review [email or web address].

## 2023-03-21 NOTE — Progress Notes (Signed)
Physical Therapy Treatment Patient Details Name: Melissa Hayden MRN: [id number] DOB: 1975-12-30 Today's Date: 03/21/2023   History of Present Illness Pt is 47 yo female who presented to hospital on 03/17/23 with delirium and required Precedex and admission to ICU.  Pt able to wean off Precedex and able to provide hx of symptoms of dysarthria and dizziness for 2-3 weeks.  Pt with metabolic encephalopathy.  MRI is pending.  Pt with hx of Marfan's syndrome, hx MVR and AVR on warfarin, NICM/sCHF and depression    PT Comments    Pt with progress but slower than expected and with living alone may need further rehab prior to return home - updated recommendations.  She required min A for all transfers and ambulated 20'x2 but very slowly, unsteady, and with evidence of R LE weakness and ataxia.  She had some lightheadedness with initial transfers but improved throughout session.  BP did have a 20 point drop but values still within normal range (see below).    Orthostatic BP: Supine 143/83 Sitting 137/78 , reports lightheadedness/with vision blackening but improved with a few second Standing 125/79 Standing 3 mins 123/88 Reports generalized lightheadedness throughout but not as severe as when she first sat Prior to standing had pt perform 10 reps bil AROM anlke pumps, knee ext, hip flex, shoulder flexion    Recommendations for follow up therapy are one component of a multi-disciplinary discharge planning process, led by the attending physician.  Recommendations may be updated based on patient status, additional functional criteria and insurance authorization.  Follow Up Recommendations       Assistance Recommended at Discharge Intermittent Supervision/Assistance  Patient can return home with the following A little help with bathing/dressing/bathroom;A little help with walking and/or transfers;Assistance with cooking/housework;Help with stairs or ramp for entrance   Equipment Recommendations  Rolling  walker (2 wheels);BSC/3in1    Recommendations for Other Services Rehab consult     Precautions / Restrictions Precautions Precautions: Fall     Mobility  Bed Mobility Overal bed mobility: Needs Assistance Bed Mobility: Supine to Sit     Supine to sit: Supervision          Transfers Overall transfer level: Needs assistance Equipment used: Rolling walker (2 wheels) Transfers: Sit to/from Stand, Bed to chair/wheelchair/BSC Sit to Stand: Min assist   Step pivot transfers: Min assist       General transfer comment: Performed multiple sit to stands during exam with min A to steady.    Ambulation/Gait Ambulation/Gait assistance: Min assist Gait Distance (Feet): 20 Feet (20'x2) Assistive device: Rolling walker (2 wheels) (furniture) Gait Pattern/deviations: Step-to pattern, Decreased stride length, Narrow base of support Gait velocity: decreased Gait velocity interpretation: <1.8 ft/sec, indicate of risk for recurrent falls   General Gait Details: Pt wanting to try to ambulate without RW.  Ambulated 20' but pt continually reaching to hold counter or bed and very unsteady.  Pt had rest break and then ambulated 20' with RW - improved balance but still min A at times and still with slow gait, narrow BOS, and evidence of R LE weakness (some buckling and hyperextension).  R LE also with mild ataxia at times - particularly when stepping with R LE first.  Improved steadiness with L LE stepping first.   Stairs             Wheelchair Mobility    Modified Rankin (Stroke Patients Only) Modified Rankin (Stroke Patients Only) Pre-Morbid Rankin Score: No symptoms Modified Rankin: Moderately severe disability  Balance Overall balance assessment: Needs assistance   Sitting balance-Leahy Scale: Good     Standing balance support: No upper extremity supported, Single extremity supported Standing balance-Leahy Scale: Poor Standing balance comment: Requiring at least  single UE support                            Cognition Arousal/Alertness: Awake/alert Behavior During Therapy: WFL for tasks assessed/performed Overall Cognitive Status: Within Functional Limits for tasks assessed                                 General Comments: Pt aware she was confused at admission and does not remember what happened or when she fell prior to admission        Exercises Target tapping with bil feet in seated position : pt more accurate on  L side  Performed AROM exercises per below to assist with lightheadedness/orthostatic management.    General Comments   Orthostatic BP: Supine 143/83 Sitting 137/78 , reports lightheadedness/with vision blackening but improved with a few second Standing 125/79 Standing 3 mins 123/88 Reports generalized lightheadedness throughout but not as severe as when she first sat Prior to standing had pt perform 10 reps bil AROM anlke pumps, knee ext, hip flex, shoulder flexion  Testing: MMT bil knee ext, ankle dorsiflexion, and hip flexion 5/5 but has evidence on R LE weakness with gait     Pertinent Vitals/Pain Pain Assessment Pain Assessment: No/denies pain    Home Living                          Prior Function            PT Goals (current goals can now be found in the care plan section) Progress towards PT goals: Progressing toward goals    Frequency    Min 4X/week      PT Plan Discharge plan needs to be updated    Co-evaluation              AM-PAC PT "6 Clicks" Mobility   Outcome Measure  Help needed turning from your back to your side while in a flat bed without using bedrails?: None Help needed moving from lying on your back to sitting on the side of a flat bed without using bedrails?: A Little Help needed moving to and from a bed to a chair (including a wheelchair)?: A Little Help needed standing up from a chair using your arms (e.g., wheelchair or bedside chair)?:  A Little Help needed to walk in hospital room?: A Lot Help needed climbing 3-5 steps with a railing? : Total 6 Click Score: 16    End of Session Equipment Utilized During Treatment: Gait belt Activity Tolerance: Patient tolerated treatment well Patient left: with chair alarm set;in chair;with call bell/phone within reach Nurse Communication: Mobility status PT Visit Diagnosis: Unsteadiness on feet (R26.81);Other abnormalities of gait and mobility (R26.89)     Time: 0981-1914 PT Time Calculation (min) (ACUTE ONLY): 31 min  Charges:  $Gait Training: 8-22 mins $Therapeutic Activity: 8-22 mins                     Anise Salvo, PT Acute Rehab Services [facility] [phone] [   Rayetta Humphrey 03/21/2023, 4:40 PM

## 2023-03-21 NOTE — TOC CM/SW Note (Signed)
Transition of Care [facility]) - Inpatient Brief Assessment   Patient Details  Name: Melissa Hayden MRN: [id number] Date of Birth: 06/23/1976  Transition of Care [facility]) CM/SW Contact:    Epifanio Lesches, RN Phone Number: 03/21/2023, 12:19 PM   Clinical Narrative: Presents with slurred speech confusion, and dizziness; AMS. From home alone. States has friend support. Pt works FT. No insurance. Pt screened by [facility] for Medicaid. Confirmed PCP. TOC team will watch for RX med cost, ? Match Letter need.  TOC team following for needs....  Transition of Care Asessment: Insurance and Status: Insurance coverage has been reviewed Patient has primary care physician: Yes Home environment has been reviewed: From home alone. Prior level of function:: Independent with ADL's, no DME usage Prior/Current Home Services: No current home services Social Determinants of Health Reivew: SDOH reviewed no interventions necessary Readmission risk has been reviewed: No Transition of care needs: no transition of care needs at this time

## 2023-03-21 NOTE — Evaluation (Signed)
Occupational Therapy Evaluation Patient Details Name: Melissa Hayden MRN: [id number] DOB: 06/07/1976 Today's Date: 03/21/2023   History of Present Illness Pt is 47 yo female who presented to hospital on 03/17/23 with delirium and required Precedex and admission to ICU.  Pt able to wean off Precedex and able to provide hx of symptoms of dysarthria and dizziness for 2-3 weeks.  Pt with metabolic encephalopathy.  MRI is pending.  Pt with hx of Marfan's syndrome, hx MVR and AVR on warfarin, NICM/sCHF and depression   Clinical Impression   Pt admitted for above dx, PTA pt lived alone and reports ind in all ADLs. Pt currently presenting with impaired balance, needing Min A HHA to balance no AD also using walls/furniture to help support balance. Upon cueing to reduce furniture walking pt noted to be having increase dizziness and was more unsteady, REL appeared to be buckling and gait was slow. Pt can complete seated bADLs with Supervision. Pt would benefit from continued acute skilled OT to address above deficits and help transition to next level of care. Patient has the potential to reach Mod I and demos the ability to tolerate 3 hours of therapy. Pt would benefit from an intensive rehab program to help maximize functional independence.       Recommendations for follow up therapy are one component of a multi-disciplinary discharge planning process, led by the attending physician.  Recommendations may be updated based on patient status, additional functional criteria and insurance authorization.   Assistance Recommended at Discharge Intermittent Supervision/Assistance  Patient can return home with the following A little help with walking and/or transfers;A little help with bathing/dressing/bathroom;Assist for transportation;Assistance with cooking/housework;Help with stairs or ramp for entrance    Functional Status Assessment  Patient has had a recent decline in their functional status and demonstrates the  ability to make significant improvements in function in a reasonable and predictable amount of time.  Equipment Recommendations  Tub/shower bench;BSC/3in1    Recommendations for Other Services       Precautions / Restrictions Precautions Precautions: Fall Restrictions Weight Bearing Restrictions: No      Mobility Bed Mobility Overal bed mobility: Needs Assistance Bed Mobility: Supine to Sit, Sit to Supine     Supine to sit: Supervision Sit to supine: Supervision        Transfers Overall transfer level: Needs assistance Equipment used: Rolling walker (2 wheels)   Sit to Stand: Min guard           General transfer comment: pt needing two attempts at standing, during first attempt pt felt unsteady and "off" so she sat back down      Balance Overall balance assessment: Needs assistance   Sitting balance-Leahy Scale: Good     Standing balance support: No upper extremity supported, Single extremity supported Standing balance-Leahy Scale: Poor Standing balance comment: Requiring at least single UE support                           ADL either performed or assessed with clinical judgement   ADL Overall ADL's : Needs assistance/impaired Eating/Feeding: Independent;Sitting   Grooming: Standing;Minimal assistance   Upper Body Bathing: Sitting;Set up   Lower Body Bathing: Sitting/lateral leans;Supervison/ safety;Set up   Upper Body Dressing : Sitting;Independent   Lower Body Dressing: Supervision/safety;Sitting/lateral leans;Set up   Toilet Transfer: Ambulation;Minimal assistance   Toileting- Clothing Manipulation and Hygiene: Min guard;Sit to/from stand   Tub/ Engineer, structural: Moderate assistance;Ambulation   Functional mobility  during ADLs: Minimal assistance       Vision         Perception     Praxis      Pertinent Vitals/Pain Pain Assessment Pain Assessment: No/denies pain     Hand Dominance Right   Extremity/Trunk  Assessment Upper Extremity Assessment Upper Extremity Assessment: Generalized weakness   Lower Extremity Assessment Lower Extremity Assessment: Generalized weakness   Cervical / Trunk Assessment Cervical / Trunk Assessment: Normal   Communication Communication Communication: No difficulties   Cognition Arousal/Alertness: Awake/alert   Overall Cognitive Status: Within Functional Limits for tasks assessed                                       General Comments       Exercises     Shoulder Instructions      Home Living Family/patient expects to be discharged to:: Private residence Living Arrangements: Alone Available Help at Discharge: Family;Other (Comment) (cousin, limited time to be able to assist) Type of Home: House Home Access: Stairs to enter [employer] of Steps: 5 Entrance Stairs-Rails: None Home Layout: One level     Bathroom Shower/Tub: Chief Strategy Officer: Standard     Home Equipment: None      Lives With: Alone (with 3 dogs)    Prior Functioning/Environment Prior Level of Function : Independent/Modified Independent             Mobility Comments: ind ADLs Comments: ind        OT Problem List: Decreased strength;Decreased activity tolerance;Impaired balance (sitting and/or standing)      OT Treatment/Interventions: Therapeutic exercise;Therapeutic activities;DME and/or AE instruction;Patient/family education;Balance training    OT Goals(Current goals can be found in the care plan section) Acute Rehab OT Goals Patient Stated Goal: To get stronger OT Goal Formulation: With patient Time For Goal Achievement: 04/04/23 Potential to Achieve Goals: Good ADL Goals Pt Will Perform Grooming: standing;with supervision Pt Will Transfer to Toilet: ambulating;with min guard assist Pt Will Perform Tub/Shower Transfer: with min guard assist Additional ADL Goal #2: Pt will tolerate 5 mins of dynamic standing  balance activities with Supervision, without becoming symptomatic in preparation for OOB bADLs/iADLs  OT Frequency: Min 2X/week    Co-evaluation              AM-PAC OT "6 Clicks" Daily Activity     Outcome Measure Help from another person eating meals?: None Help from another person taking care of personal grooming?: A Little Help from another person toileting, which includes using toliet, bedpan, or urinal?: A Little Help from another person bathing (including washing, rinsing, drying)?: A Little Help from another person to put on and taking off regular upper body clothing?: None Help from another person to put on and taking off regular lower body clothing?: A Little 6 Click Score: 20   End of Session Equipment Utilized During Treatment: Gait belt Nurse Communication: Mobility status  Activity Tolerance: Patient tolerated treatment well Patient left: in bed;with call bell/phone within reach  OT Visit Diagnosis: Unsteadiness on feet (R26.81);Other abnormalities of gait and mobility (R26.89);Muscle weakness (generalized) (M62.81)                Time: 1610-9604 OT Time Calculation (min): 18 min Charges:  OT General Charges $OT Visit: 1 Visit OT Evaluation $OT Eval Low Complexity: 1 Low  03/21/2023  AB, OTR/L  Acute Rehabilitation Services  Office: [phone]   Tristan Schroeder 03/21/2023, 5:57 PM

## 2023-03-21 NOTE — Plan of Care (Signed)
MRI results IMPRESSION: 1. Small foci of susceptibility artifact in the high left frontoparietal region and right cerebellar hemisphere may reflect chronic microhemorrhages or small cavernomas. 2. Otherwise unremarkable brain MRI.   Largely unremarkable with chronic findings. recommend follow-up MRI outpatient to follow possible cavernomas.  Continue Coumadin.  Close follow-up with outpatient neurology   Neurology will sign off please call with questions.

## 2023-03-22 LAB — COMPREHENSIVE METABOLIC PANEL
ALT: 15 U/L (ref 0–44)
AST: 14 U/L — ABNORMAL LOW (ref 15–41)
Albumin: 3 g/dL — ABNORMAL LOW (ref 3.5–5.0)
Alkaline Phosphatase: 35 U/L — ABNORMAL LOW (ref 38–126)
Anion gap: 6 (ref 5–15)
BUN: <5 mg/dL — ABNORMAL LOW (ref 6–20)
CO2: 24 mmol/L (ref 22–32)
Calcium: 8.6 mg/dL — ABNORMAL LOW (ref 8.9–10.3)
Chloride: 109 mmol/L (ref 98–111)
Creatinine, Ser: 0.76 mg/dL (ref 0.44–1.00)
GFR, Estimated: >60 mL/min (ref >60)
Glucose, Bld: 125 mg/dL — ABNORMAL HIGH (ref 70–99)
Potassium: 3.5 mmol/L (ref 3.5–5.1)
Sodium: 139 mmol/L (ref 135–145)
Total Bilirubin: 0.3 mg/dL (ref 0.3–1.2)
Total Protein: 5.4 g/dL — ABNORMAL LOW (ref 6.5–8.1)

## 2023-03-22 LAB — CBC WITH DIFFERENTIAL/PLATELET
Abs Immature Granulocytes: 0.02 10*3/uL (ref 0.00–0.07)
Basophils Absolute: 0 10*3/uL (ref 0.0–0.1)
Basophils Relative: 0 %
Eosinophils Absolute: 0.1 10*3/uL (ref 0.0–0.5)
Eosinophils Relative: 2 %
HCT: 37.3 % (ref 36.0–46.0)
Hemoglobin: 12 g/dL (ref 12.0–15.0)
Immature Granulocytes: 0 %
Lymphocytes Relative: 15 %
Lymphs Abs: 1.1 10*3/uL (ref 0.7–4.0)
MCH: 28.6 pg (ref 26.0–34.0)
MCHC: 32.2 g/dL (ref 30.0–36.0)
MCV: 88.8 fL (ref 80.0–100.0)
Monocytes Absolute: 0.6 10*3/uL (ref 0.1–1.0)
Monocytes Relative: 8 %
Neutro Abs: 5.4 10*3/uL (ref 1.7–7.7)
Neutrophils Relative %: 75 %
Platelets: 223 10*3/uL (ref 150–400)
RBC: 4.2 MIL/uL (ref 3.87–5.11)
RDW: 15.4 % (ref 11.5–15.5)
WBC: 7.2 10*3/uL (ref 4.0–10.5)
nRBC: 0 % (ref 0.0–0.2)

## 2023-03-22 LAB — HEPARIN LEVEL (UNFRACTIONATED): Heparin Unfractionated: 0.34 IU/mL (ref 0.30–0.70)

## 2023-03-22 LAB — CULTURE, BLOOD (ROUTINE X 2): Special Requests: ADEQUATE

## 2023-03-22 LAB — PROTIME-INR
INR: 1.1 (ref 0.8–1.2)
Prothrombin Time: 14 seconds (ref 11.4–15.2)

## 2023-03-22 MED ORDER — SODIUM CHLORIDE 0.9 % IV BOLUS
1000.0000 mL | Freq: Once | INTRAVENOUS | Status: AC
  Administered 2023-03-22: 1000 mL via INTRAVENOUS

## 2023-03-22 MED ORDER — WARFARIN SODIUM 7.5 MG PO TABS
7.5000 mg | ORAL_TABLET | Freq: Once | ORAL | Status: AC
  Administered 2023-03-22: 7.5 mg via ORAL
  Filled 2023-03-22: qty 1

## 2023-03-22 NOTE — Progress Notes (Signed)
Physical Therapy Treatment Patient Details Name: Melissa Hayden MRN: [id number] DOB: [birth year] Today's Date: 03/22/2023   History of Present Illness Pt is 47 yo female who presented to hospital on 03/17/23 with delirium and required Precedex and admission to ICU.  Pt able to wean off Precedex and able to provide hx of symptoms of dysarthria and dizziness for 2-3 weeks.  Pt with metabolic encephalopathy.  MRI is pending.  Pt with hx of Marfan's syndrome, hx MVR and AVR on warfarin, NICM/sCHF and depression    PT Comments    Pt received in supine and agreeable to session. Pt reporting dizziness earlier when sitting in the recliner and needing to return to bed with noted BP drop. Pt able to perform bed mobility without assist and reports no adverse symptoms, however BP noted to be 91/80. Pt able to perform seated exercises with good BLE control, however pt reporting that the RLE felt more difficult to control. BP checked again and noted to be 81/65, so pt returned to supine with BP improving to 106/63. Pt instructed in supine exercises with pt able to demo with good technique. Pt continues to benefit from PT services to progress toward functional mobility goals.     Recommendations for follow up therapy are one component of a multi-disciplinary discharge planning process, led by the attending physician.  Recommendations may be updated based on patient status, additional functional criteria and insurance authorization.     Assistance Recommended at Discharge Intermittent Supervision/Assistance  Patient can return home with the following A little help with bathing/dressing/bathroom;A little help with walking and/or transfers;Assistance with cooking/housework;Help with stairs or ramp for entrance   Equipment Recommendations  Rolling walker (2 wheels);BSC/3in1    Recommendations for Other Services       Precautions / Restrictions Precautions Precautions: Fall Precaution Comments: watch  BP Restrictions Weight Bearing Restrictions: No     Mobility  Bed Mobility Overal bed mobility: Needs Assistance Bed Mobility: Supine to Sit, Sit to Supine     Supine to sit: Supervision Sit to supine: Supervision        Transfers Overall transfer level: Needs assistance Equipment used: Rolling walker (2 wheels) Transfers: Sit to/from Stand Sit to Stand: Supervision           General transfer comment: from EOB with supervision for safety    Ambulation/Gait               General Gait Details: deferred due to low BP       Balance Overall balance assessment: Needs assistance Sitting-balance support: Feet supported, No upper extremity supported Sitting balance-Leahy Scale: Normal Sitting balance - Comments: sitting EOB       Standing balance comment: not tested                            Cognition Arousal/Alertness: Awake/alert Behavior During Therapy: WFL for tasks assessed/performed Overall Cognitive Status: Within Functional Limits for tasks assessed                                          Exercises General Exercises - Lower Extremity Ankle Circles/Pumps: AROM, Seated, Supine, Both, 10 reps Long Arc Quad: AROM, Seated, Both, 10 reps Heel Slides: AROM, Supine, Both, 15 reps Hip ABduction/ADduction: AROM, Supine, Both, 10 reps Straight Leg Raises: AROM, Supine, Both, 10 reps Hip Flexion/Marching: AROM,  Seated, Both, 10 reps    General Comments General comments (skin integrity, edema, etc.): Pt reporting no adverse symptoms throughout session, however BP noted to be 91/80 sitting EOB dropping to 81/65 after seated exercises. Pt returned to supine and BP improved to 106/63. RN notified      Pertinent Vitals/Pain Pain Assessment Pain Assessment: No/denies pain     PT Goals (current goals can now be found in the care plan section) Acute Rehab PT Goals Patient Stated Goal: get back to her dogs PT Goal Formulation:  With patient Time For Goal Achievement: 04/03/23 Potential to Achieve Goals: Good Progress towards PT goals: Progressing toward goals    Frequency    Min 4X/week      PT Plan Current plan remains appropriate       AM-PAC PT "6 Clicks" Mobility   Outcome Measure  Help needed turning from your back to your side while in a flat bed without using bedrails?: None Help needed moving from lying on your back to sitting on the side of a flat bed without using bedrails?: A Little Help needed moving to and from a bed to a chair (including a wheelchair)?: A Little Help needed standing up from a chair using your arms (e.g., wheelchair or bedside chair)?: A Little Help needed to walk in hospital room?: A Lot Help needed climbing 3-5 steps with a railing? : Total 6 Click Score: 16    End of Session   Activity Tolerance: Patient tolerated treatment well Patient left: with call bell/phone within reach;in bed;with bed alarm set Nurse Communication: Mobility status PT Visit Diagnosis: Unsteadiness on feet (R26.81);Other abnormalities of gait and mobility (R26.89)     Time: 7829-5621 PT Time Calculation (min) (ACUTE ONLY): 27 min  Charges:  $Therapeutic Exercise: 8-22 mins $Therapeutic Activity: 8-22 mins                     Johny Shock, PTA Acute Rehabilitation Services Secure Chat Preferred  Office:([phone]    Johny Shock 03/22/2023, 12:30 PM

## 2023-03-22 NOTE — Progress Notes (Signed)
Progress Note   Patient: Melissa Hayden VHQ:[id number] DOB: Jan 08, 1976 DOA: 03/17/2023     5 DOS: the patient was seen and examined on 03/22/2023 at 8:49AM      Brief hospital course: Melissa Hayden is a 47 y.o. F with Marfan's syndrome, hx MVR and AVR on warfarin, hx NICM/sCHF EF30-35% recovered to 50%, and depression who presented with 3 weeks confusion.  On admission, the patient was progressively delirious and required Precedex and admission to the ICU.  She was ultimately able to wean off Precedex and able to give coherent history that her symptoms were dysarthria and dizziness starting 2-3 weeks PTA.  These symptoms were initially severe, but improved slightly and she didn't seek care because she was afraid, but eventually developed severe headache that prompted her to come to the ER.  Patient was found to be encephalopathic, etiology was initially thought to be infection and treated for the same but no infectious source identified.  Subsequently patient's mental status has returned to baseline since patient had dysarthria prior to getting confused stroke as been suspected, neurologist evaluated MRI of the brain done which did not identify any significant stroke.  [place] has improved significantly.  No other focal neurological deficits at this time.     Assessment and Plan: Acute metabolic encephalopathy Unclear precipitant.  She was disoriented, not paranoid, hallucinating.  She denies alcohol use or other drugs.  She was treated for infection here initially, but had no fever, leukocytosis, abx were stopped and she has had no clinical change. - Mental status appears to be back to baseline, intermittently patient does have some dysarthria. - Evaluated with an MRI which showed small foci of susceptibility artifact in the high left frontoparietal region and right cerebral hemisphere which may reflect chronic microhemorrhages or small cavernoma's but no acute stroke or subacute stroke  identified. - Consult Neurology, appreciate recommendations, advised outpatient follow-up MRI and to continue Coumadin.    Dysarthria There is no tongue swelling.  To me she reports that this started several weeks prior to admission (not the night before as some notes indicate).  It was initially more severe, then improved a little, but is still present. - Patient reports intermittent dysarthria nurse also test the same.  No difficulty with swallowing. - Evaluated with an MRI which showed small foci of susceptibility artifact in the high left frontoparietal region and right cerebral hemisphere which may reflect chronic microhemorrhages or small cavernoma's but no acute stroke or subacute stroke identified. - Consult Neurology, appreciate recommendations, advised outpatient follow-up MRI and to continue Coumadin.     Renal infarct There was initial concern that this represented infection, but UA normal, UCx no growth.  Suspect small infarct due to missed warfarin. - Continue warfarin - Continue heparin bridge -INR is still subtherapeutic, Coumadin dosed by pharmacy    History of aortic valve and mitral valve replacement in the setting of Marfan syndrome Had run out of warfarin a few weeks prior to admission due to financial issues (lost car, couldn't get to pharmacy) - Continue warfarin - Continue heparin bridge - Pharmacy dosing warfarin, INR still subtherapeutic.  Dizziness: Secondary to orthostasis from dehydration - IV fluid bolus ordered.   Anemia, normocytic Hgb stable, no clinical bleeding.  Hypokalemia - Supplement K  Depression - Continue Celexa, olanzapine  Chronic systolic congestive heart failure Appears compensated.  Not on diuretics at home.  EF this admission recovered to 50-55%.           Subjective:  Patient seen and examined at bedside today.  Patient became dizzy upon sitting up and participating with physical therapy, orthostatics positive.   Treating with IV fluid bolus.  MRI done negative for acute stroke.     Physical Exam: BP (!) 111/59 (BP Location: Right Arm)   Pulse 69   Temp 99 F (37.2 C) (Oral) Comment: primary nurse aware  Resp 16   Ht 6\' 2"  (1.88 m)   Wt 73.2 kg   SpO2 97%   BMI 20.72 kg/m   Thin adult female, lying in bed, interactive and appropriate RRR, soft systolic murmur, no peripheral edema No JVD Respiratory rate normal, lungs clear without rales or wheezes Abdomen soft without tenderness palpation or guarding Pupils are equal and reactive.  Extraocular movements are intact, without nystagmus.  Cranial nerve 5 is within normal limits.  Cranial nerve 7 is symmetrical.  Cranial nerve 8 is within normal limits.  Cranial nerves 9 and 10 reveal equal palate elevation.  Cranial nerve 11 reveals sternocleidomastoid strong.  Cranial nerve 12 is midline.  Motor strength testing is 5/5 in the upper and lower extremities bilaterally with normal motor, tone and bulk. Finger-to-nose testing is within normal limits. The patient is oriented to time, place and person.  Speech is dysarthric.  Naming is moderately impaired (only able to name 2 farm animals in 1 minute).  Short term memory also suspect is slightly impaired.    Attention span and concentration are within normal limits.      Data Reviewed: Discussed with Neurology by secure chat BMP shows hypokalemia, normal renal function CBC shows mild anemia, stable       Disposition: Status is: Inpatient Admitted for encephalopathy.  The encephalopathy has cleared but she has dysarthria which is unusual.  Needs MRI brain and Neurology consults.    If all reassuring, possibly home in the next day or two         Author: Harold Hedge, MD 03/22/2023 1:00 PM  For on call review [email or web address].

## 2023-03-22 NOTE — Progress Notes (Signed)
Inpatient Rehab Admissions Coordinator:  ? ?Per therapy recommendations,  patient was screened for CIR candidacy by Geneviene Tesch, MS, CCC-SLP. At this time, Pt. Appears to be a a potential candidate for CIR. I will place   order for rehab consult per protocol for full assessment. Please contact me any with questions. ? ?Melissa Celmer, MS, CCC-SLP ?Rehab Admissions Coordinator  ?[phone] (celll) ?[phone] (office) ? ?

## 2023-03-22 NOTE — Plan of Care (Signed)
  Problem: Nutrition: Goal: Adequate nutrition will be maintained Outcome: Progressing   Problem: Coping: Goal: Level of anxiety will decrease Outcome: Progressing   Problem: Elimination: Goal: Will not experience complications related to bowel motility Outcome: Progressing   Problem: Elimination: Goal: Will not experience complications related to urinary retention Outcome: Progressing   

## 2023-03-22 NOTE — Progress Notes (Addendum)
  Inpatient Rehabilitation Admissions Coordinator   Met with patient at bedside for rehab assessment. We discussed goals and expectations of a possible CIR admit. I reviewed estimated cost of care for CIR as she is uninsured. She prefers direct discharge home if possible due to cost of CIR admit. I will follow up over the next 24 to 48 hrs to see if she has progressed to realistically discharge home. Please call me with any questions.   Ottie Glazier, RN, MSN Rehab Admissions Coordinator [phone]

## 2023-03-22 NOTE — Progress Notes (Signed)
ANTICOAGULATION CONSULT NOTE - Follow-up Consult  Pharmacy Consult for heparin + warfarin Indication: aortic and mitral valve replacements setting of Marfan syndrome   Allergies  Allergen Reactions   Abilify [Aripiprazole] Other (See Comments)    "Flattened affect" "couldn't do anything while taking it"    Patient Measurements: Height: 6\' 2"  (188 cm) Weight: 73.2 kg (161 lb 6.4 oz) IBW/kg (Calculated) : 77.7 Heparin Dosing Weight: 70 kg  Vital Signs: Temp: 98 F (36.7 C) (06/11 0555) Temp Source: Oral (06/11 0555) BP: 136/66 (06/11 0555) Pulse Rate: 63 (06/11 0555)  Labs: Recent Labs    03/19/23 0958 03/19/23 1447 03/20/23 0148 03/21/23 0233 03/22/23 0205  HGB  --   --  10.5* 11.3*  --   HCT  --   --  32.8* 35.8*  --   PLT  --   --  204 223  --   LABPROT  --    < > 15.0 14.1 14.0  INR  --    < > 1.2 1.1 1.1  HEPARINUNFRC  --   --  0.34 0.34 0.34  CREATININE 0.67  --  0.62 0.59  --    < > = values in this interval not displayed.     Estimated Creatinine Clearance: 101.5 mL/min (by C-G formula based on SCr of 0.59 mg/dL).   Medical History: Past Medical History:  Diagnosis Date   Anticoagulant long-term use    after valve surgery for Marfan's syndrome   Anxiety    Ascending aorta dilatation (HCC)    Bipolar disorder (HCC)    Depression    Marfan's syndrome    Dr. Beverely Pace   Migraine    Mitral valve prolapse    Palpitations    Suicidal behavior    Tylenol Overdose   Varicose veins     Assessment: 47 yo female with history of aortic valve replacement setting of Marfan syndrome admitted with AMS. Patient previously on warfarin; however, has not taken in 3 months due to trouble obtaining medication. Pharmacy consulted to dose IV heparin and restart warfarin while on heparin bridge.   Last documented (2023) PTA regimen: warfarin 5mg  Monday, Wednesday, Friday, Saturday, Sunday and 7.5mg  on Tuesday, Thursday.  Heparin level 0.34 (therapeutic) with heparin  running at 1500 units/hr. INR remains subtherapeutic this AM at 1.1 - received boosted dose yesterday. No issues with bleeding or infusion noted.   Goal of Therapy:  Heparin level 0.3-0.7 units/ml INR goal 2.5-3.5 Monitor platelets by anticoagulation protocol: Yes   Plan:  Continue heparin at 1,500 units/hr Warfarin 7.5 mg PO x1 - per home regimen Check heparin level, INR, and CBC daily Monitor for s/sx of bleeding Patient would benefit from [facility] pharmacy at discharge  Rexford Maus, PharmD, BCPS 03/22/2023 7:37 AM

## 2023-03-23 DIAGNOSIS — F3160 Bipolar disorder, current episode mixed, unspecified: Secondary | ICD-10-CM

## 2023-03-23 DIAGNOSIS — E8809 Other disorders of plasma-protein metabolism, not elsewhere classified: Secondary | ICD-10-CM

## 2023-03-23 LAB — CBC WITH DIFFERENTIAL/PLATELET
Abs Immature Granulocytes: 0.03 10*3/uL (ref 0.00–0.07)
Basophils Absolute: 0 10*3/uL (ref 0.0–0.1)
Basophils Relative: 0 %
Eosinophils Absolute: 0.1 10*3/uL (ref 0.0–0.5)
Eosinophils Relative: 1 %
HCT: 37.3 % (ref 36.0–46.0)
Hemoglobin: 11.7 g/dL — ABNORMAL LOW (ref 12.0–15.0)
Immature Granulocytes: 0 %
Lymphocytes Relative: 23 %
Lymphs Abs: 1.6 10*3/uL (ref 0.7–4.0)
MCH: 27.4 pg (ref 26.0–34.0)
MCHC: 31.4 g/dL (ref 30.0–36.0)
MCV: 87.4 fL (ref 80.0–100.0)
Monocytes Absolute: 0.7 10*3/uL (ref 0.1–1.0)
Monocytes Relative: 10 %
Neutro Abs: 4.6 10*3/uL (ref 1.7–7.7)
Neutrophils Relative %: 66 %
Platelets: 212 10*3/uL (ref 150–400)
RBC: 4.27 MIL/uL (ref 3.87–5.11)
RDW: 15.7 % — ABNORMAL HIGH (ref 11.5–15.5)
WBC: 7.1 10*3/uL (ref 4.0–10.5)
nRBC: 0 % (ref 0.0–0.2)

## 2023-03-23 LAB — COMPREHENSIVE METABOLIC PANEL
ALT: 16 U/L (ref 0–44)
AST: 15 U/L (ref 15–41)
Albumin: 3 g/dL — ABNORMAL LOW (ref 3.5–5.0)
Alkaline Phosphatase: 36 U/L — ABNORMAL LOW (ref 38–126)
Anion gap: 8 (ref 5–15)
BUN: 6 mg/dL (ref 6–20)
CO2: 25 mmol/L (ref 22–32)
Calcium: 8.6 mg/dL — ABNORMAL LOW (ref 8.9–10.3)
Chloride: 107 mmol/L (ref 98–111)
Creatinine, Ser: 0.74 mg/dL (ref 0.44–1.00)
GFR, Estimated: >60 mL/min (ref >60)
Glucose, Bld: 86 mg/dL (ref 70–99)
Potassium: 4.1 mmol/L (ref 3.5–5.1)
Sodium: 140 mmol/L (ref 135–145)
Total Bilirubin: 0.7 mg/dL (ref 0.3–1.2)
Total Protein: 5.5 g/dL — ABNORMAL LOW (ref 6.5–8.1)

## 2023-03-23 LAB — HEPARIN LEVEL (UNFRACTIONATED)
Heparin Unfractionated: 0.27 IU/mL — ABNORMAL LOW (ref 0.30–0.70)
Heparin Unfractionated: 0.28 IU/mL — ABNORMAL LOW (ref 0.30–0.70)

## 2023-03-23 LAB — VITAMIN B1: Vitamin B1 (Thiamine): 169.4 nmol/L (ref 66.5–200.0)

## 2023-03-23 LAB — PROTIME-INR
INR: 1.4 — ABNORMAL HIGH (ref 0.8–1.2)
Prothrombin Time: 17.7 seconds — ABNORMAL HIGH (ref 11.4–15.2)

## 2023-03-23 MED ORDER — SODIUM CHLORIDE 0.9 % IV BOLUS
500.0000 mL | Freq: Once | INTRAVENOUS | Status: AC
  Administered 2023-03-23: 500 mL via INTRAVENOUS

## 2023-03-23 MED ORDER — ENOXAPARIN SODIUM 80 MG/0.8ML IJ SOSY
70.0000 mg | PREFILLED_SYRINGE | Freq: Two times a day (BID) | INTRAMUSCULAR | Status: DC
  Administered 2023-03-23 – 2023-03-24 (×4): 70 mg via SUBCUTANEOUS
  Filled 2023-03-23 (×6): qty 0.7

## 2023-03-23 MED ORDER — WARFARIN SODIUM 5 MG PO TABS
5.0000 mg | ORAL_TABLET | Freq: Once | ORAL | Status: AC
  Administered 2023-03-23: 5 mg via ORAL
  Filled 2023-03-23: qty 1

## 2023-03-23 NOTE — Progress Notes (Signed)
Mobility Specialist Progress Note:    03/23/23 1000  Orthostatic Lying   BP- Lying 112/64  Orthostatic Sitting  BP- Sitting (!) 114/91  Orthostatic Standing at 0 minutes  BP- Standing at 0 minutes 95/83  Pulse- Standing at 0 minutes 174  Mobility  Activity Stood at bedside  Level of Assistance Standby assist, set-up cues, supervision of patient - no hands on  Assistive Device Front wheel walker  Activity Response Tolerated well  Mobility Referral Yes  $Mobility charge 1 Mobility  Mobility Specialist Start Time (ACUTE ONLY) 1003  Mobility Specialist Stop Time (ACUTE ONLY) 1018  Mobility Specialist Time Calculation (min) (ACUTE ONLY) 15 min   Pt received in bed, agreeable to mobilize. Completed orthostatic vital signs, while attempting to take standing BP, pt c/o dizziness needing to sit down. Pt offered to stand and mobilize further but declined d/t symptoms. Pt situated back in bed with call bell at hand. All needs met.    Thompson Grayer Mobility Specialist  Please contact vis Secure Chat or  Rehab Office [phone]

## 2023-03-23 NOTE — Progress Notes (Addendum)
ANTICOAGULATION CONSULT NOTE - Follow-up Consult  Pharmacy Consult for heparin >> Lovenox + warfarin Indication: aortic and mitral valve replacements setting of Marfan syndrome   Allergies  Allergen Reactions   Abilify [Aripiprazole] Other (See Comments)    "Flattened affect" "couldn't do anything while taking it"    Patient Measurements: Height: 6\' 2"  (188 cm) Weight: 73.2 kg (161 lb 6.4 oz) IBW/kg (Calculated) : 77.7 Heparin Dosing Weight: 70 kg  Vital Signs: Temp: 98.6 F (37 C) (06/12 0757) Temp Source: Oral (06/12 0317) BP: 136/67 (06/12 0757) Pulse Rate: 56 (06/12 0757)  Labs: Recent Labs    03/21/23 0233 03/22/23 0205 03/22/23 0809 03/23/23 0104 03/23/23 0914  HGB 11.3*  --  12.0 11.7*  --   HCT 35.8*  --  37.3 37.3  --   PLT 223  --  223 212  --   LABPROT 14.1 14.0  --  17.7*  --   INR 1.1 1.1  --  1.4*  --   HEPARINUNFRC 0.34 0.34  --  0.27* 0.28*  CREATININE 0.59  --  0.76 0.74  --      Estimated Creatinine Clearance: 101.5 mL/min (by C-G formula based on SCr of 0.74 mg/dL).   Medical History: Past Medical History:  Diagnosis Date   Anticoagulant long-term use    after valve surgery for Marfan's syndrome   Anxiety    Ascending aorta dilatation (HCC)    Bipolar disorder (HCC)    Depression    Marfan's syndrome    Dr. Beverely Pace   Migraine    Mitral valve prolapse    Palpitations    Suicidal behavior    Tylenol Overdose   Varicose veins     Assessment: 47 yo female with history of aortic valve replacement setting of Marfan syndrome admitted with AMS. Patient previously on warfarin; however, has not taken in 3 months due to trouble obtaining medication. Pharmacy consulted to resume warfarin.   Last documented (2023) PTA regimen: warfarin 5mg  Monday, Wednesday, Friday, Saturday, Sunday and 7.5mg  on Tuesday, Thursday.  Heparin level remains slightly subtherapeutic after rate increase early this AM. INR slowly increasing from 1.1 >> 1.4. D/w MD -  will transition IV heparin to Lovenox 1mg /kg q12h [place] to continue bridging.  Goal of Therapy:  Anti-Xa level 0.6-1 units/ml 4hrs after LMWH dose given at steady state as indicated INR goal 2.5-3.5 Monitor platelets by anticoagulation protocol: Yes   Plan:  Lovenox 1mg /kg q12h [place] Discontinue heparin drip Warfarin 5 mg PO x1 Daily CBC and INR Monitor for s/sx of bleeding Patient would benefit from [facility] pharmacy at discharge  Rexford Maus, PharmD, BCPS 03/23/2023 10:09 AM

## 2023-03-23 NOTE — Progress Notes (Signed)
ANTICOAGULATION CONSULT NOTE - Initial Consult  Pharmacy Consult for heparin Indication:  AVR/MVR  Labs: Recent Labs    03/21/23 0233 03/22/23 0205 03/22/23 0809 03/23/23 0104  HGB 11.3*  --  12.0 11.7*  HCT 35.8*  --  37.3 37.3  PLT 223  --  223 212  LABPROT 14.1 14.0  --  17.7*  INR 1.1 1.1  --  1.4*  HEPARINUNFRC 0.34 0.34  --  0.27*  CREATININE 0.59  --  0.76 0.74    Assessment: 46yo female subtherapeutic on heparin after several levels at low end of goal; no infusion issues or signs of bleeding per RN.  Goal of Therapy:  Heparin level 0.3-0.7 units/ml   Plan:  Increase heparin infusion by ~1 units/kg/hr to 1600 units/hr. Check level in 6 hours.   Vernard Gambles, PharmD, BCPS 03/23/2023 3:06 AM

## 2023-03-23 NOTE — Progress Notes (Signed)
PROGRESS NOTE    Melissa Hayden  OZD:[id number] DOB: 05-Dec-1975 DOA: 03/17/2023 PCP: Melissa Agee, NP   Brief Narrative:  Melissa Hayden is a 47 y.o. F with Marfan's syndrome, hx MVR and AVR on warfarin, hx NICM/sCHF EF30-35% recovered to 50%, and depression who presented with 3 weeks confusion.  On admission, the patient was progressively delirious and required Precedex and admission to the ICU.  She was ultimately able to wean off Precedex and able to give coherent history that her symptoms were dysarthria and dizziness starting 2-3 weeks PTA.  These symptoms were initially severe, but improved slightly and she didn't seek care because she was afraid, but eventually developed severe headache that prompted her to come to the ER. Underwent MRI and Neurology evaluation.  **Her mentation is much improved now but now hospitalization has been complicated by significant orthostatic hypotension.  Assessment and Plan:  Acute Metabolic Encephalopathy, improved -Unclear precipitant.   -She was disoriented, not paranoid, hallucinating.  She denies alcohol use or other drugs.   -She was treated for infection here initially, but had no fever, leukocytosis, abx were stopped and she has had no clinical change. -Mental status appears to be back to baseline, intermittently patient does have some dysarthria. -Evaluated with an MRI which showed small foci of susceptibility artifact in the high left frontoparietal region and right cerebral hemisphere which may reflect chronic microhemorrhages or small cavernoma's but no acute stroke or subacute stroke identified. -Consulted Neurology, appreciate recommendations, advised outpatient follow-up MRI and to continue Coumadin.  Dysarthria -There is no tongue swelling.   -She reported that this started several weeks prior to admission (not the night before as some notes indicate).  It was initially more severe, then improved a little, but is still present. -Patient  reports intermittent dysarthria nurse also test the same.  No difficulty with swallowing. -Evaluated with an MRI which showed small foci of susceptibility artifact in the high left frontoparietal region and right cerebral hemisphere which may reflect chronic microhemorrhages or small cavernoma's but no acute stroke or subacute stroke identified. -Consulted Neurology and appreciated recommendations, advised outpatient follow-up MRI and to continue Coumadin.  Elevated TSH -Check Free T4 -TSH was 7.074   Renal Infarct -There was initial concern that this represented infection, but UA normal, UCx no growth.  Suspect small infarct due to missed warfarin. -Renal U/S done and showed "No collecting system dilatation. Subtle area of echogenicity in the upper pole of the right kidney corresponding to the finding by prior CT. If needed for follow up a postcontrast CT may provide further sensitivity when clinically appropriate." -Continue warfarin -Continue heparin bridge -INR is still subtherapeutic, Coumadin dosed by pharmacy   History of aortic valve and mitral valve replacement in the setting of Marfan syndrome -Had run out of warfarin a few weeks prior to admission due to financial issues (lost car, couldn't get to pharmacy) -Continue Warfarin -Changed Heparin bridge to Enoxaparin  -Pharmacy dosing warfarin, INR still subtherapeutic as PT/INR was 17.7/1.4   Dizziness and Orthostatic Hypotension -Secondary to orthostasis from dehydration -IV fluid bolus ordered and given 1 Liter yesterday and 500 mL x2 today  -Order TED Hose and Abdominal Binder -Patient's BP dropped from 128/65 supine -> 102/61 sitting and then went to 125/81 standing -> 87/69 standing at 3 minutes    Hypokalemia, improved -Patient's K+ Level Trend: Recent Labs  Lab 03/17/23 0912 03/17/23 1651 03/19/23 0958 03/20/23 0148 03/21/23 0233 03/22/23 0809 03/23/23 0104  K 3.5 4.2 3.0* 2.8*  3.4* 3.5 4.1  -Continue to Monitor  and Replete as Necessary -Repeat CMP in the AM   Depression and History of Bipolar Disorder -Continue Citalopram 20 mg po Daily and Olanzapine 20 mg po qHS -Psychiatry was consulted and recommending referring the patient to Dr. Lolly Mustache at [facility] outpatient clinic after discharge and discharge the patient with 1 month supply of olanzapine and Celexa for continuity of care  Normocytic Anemia -Hgb/Hct Trend: Recent Labs  Lab 03/17/23 0415 03/17/23 0656 03/18/23 0044 03/20/23 0148 03/21/23 0233 03/22/23 0809 03/23/23 0104  HGB 11.2* 10.9* 9.8* 10.5* 11.3* 12.0 11.7*  HCT 33.0* 32.0* 31.3* 32.8* 35.8* 37.3 37.3  MCV  --   --  87.4 87.9 89.5 88.8 87.4  -Check Anemia Panel in the AM -Continue to Monitor for S/Sx of Bleeding given that patient is on Coumadin and on a Lovenox Bridge; No overt bleeding noted -Repeat CBC in the AM   Chronic Systolic Congestive Heart Failure -Appears compensated.   -Not on diuretics at home.  EF this admission recovered to 50-55%.   -Strict I's and O's and Daily Weights  Intake/Output Summary (Last 24 hours) at 03/23/2023 1716 Last data filed at 03/23/2023 1527 Gross per 24 hour  Intake 720 ml  Output 4000 ml  Net -3280 ml   -Continue to Monitor for S/Sx of Volume Overload  Hypoalbuminemia -Patient's Albumin Trend: Recent Labs  Lab 03/17/23 0409 03/17/23 0912 03/17/23 1651 03/19/23 0407 03/20/23 0148 03/22/23 0809 03/23/23 0104  ALBUMIN 3.7 3.4* 3.0* 2.9* 3.0* 3.0* 3.0*  -Continue to Monitor and Trend and repeat CMP in the AM   DVT prophylaxis: Place TED hose Start: 03/23/23 1011    Code Status: Full Code Family Communication: No family present at bedside   Disposition Plan:  Level of care: Med-Surg Status is: Inpatient Remains inpatient appropriate because: Continues to be orthostatic and needs PT and OT to further evaluate and treat   Consultants:  Neurology Psychiatry  Procedures:  As delineated as above  Antimicrobials:   Anti-infectives (From admission, onward)    Start     Dose/Rate Route Frequency Ordered Stop   03/17/23 1600  cefTRIAXone (ROCEPHIN) 2 g in sodium chloride 0.9 % 100 mL IVPB  Status:  Discontinued        2 g 200 mL/hr over 30 Minutes Intravenous Every 24 hours 03/17/23 1049 03/19/23 1226   03/17/23 0715  vancomycin (VANCOCIN) IVPB 1000 mg/200 mL premix        1,000 mg 200 mL/hr over 60 Minutes Intravenous  Once 03/17/23 0712 03/17/23 0914   03/17/23 0715  ceFEPIme (MAXIPIME) 2 g in sodium chloride 0.9 % 100 mL IVPB        2 g 200 mL/hr over 30 Minutes Intravenous  Once 03/17/23 9604 03/17/23 0830       Subjective: Seen and him at bedside this feeling extremely weak and fatigued.  States that she did not feel as well.  No nausea or vomiting.  Continues to remain orthostatic and was feeling dizzy.  No chest pain or shortness of breath.  Mental status is back to baseline.  Objective: Vitals:   03/22/23 2333 03/23/23 0317 03/23/23 0757 03/23/23 1312  BP: 136/68 104/67 136/67 (!) 141/67  Pulse: (!) 55 81 (!) 56 (!) 51  Resp: 18 18 18 18   Temp: 97.6 F (36.4 C) 98.1 F (36.7 C) 98.6 F (37 C) 97.9 F (36.6 C)  TempSrc: Oral Oral    SpO2: 96% 96% 96% 99%  Weight:  Height:        Intake/Output Summary (Last 24 hours) at 03/23/2023 1732 Last data filed at 03/23/2023 1527 Gross per 24 hour  Intake 720 ml  Output 4000 ml  Net -3280 ml   Filed Weights   03/19/23 0454 03/21/23 0510 03/22/23 0555  Weight: 76.1 kg 73.2 kg 73.2 kg   Examination: Physical Exam:  Constitutional: Thin Caucasian female in no acute distress appears calm but complaining of some dizziness Respiratory: Diminished to auscultation bilaterally, no wheezing, rales, rhonchi or crackles. Normal respiratory effort and patient is not tachypenic. No accessory muscle use.  Unlabored breathing Cardiovascular: RRR, no murmurs / rubs / gallops. S1 and S2 auscultated. No extremity edema.  Abdomen: Soft, non-tender,  non-distended.  Bowel sounds positive.  GU: Deferred. Musculoskeletal: No clubbing / cyanosis of digits/nails. No joint deformity upper and lower extremities.  Skin: No rashes, lesions, ulcers on limited skin evaluation. No induration; Warm and dry.  Neurologic: CN 2-12 grossly intact with no focal deficits.  Romberg sign and cerebellar reflexes not assessed.  Psychiatric: Normal judgment and insight. Alert and oriented x 3. Normal mood and appropriate affect.   Data Reviewed: I have personally reviewed following labs and imaging studies  CBC: Recent Labs  Lab 03/17/23 0409 03/17/23 0415 03/18/23 0044 03/20/23 0148 03/21/23 0233 03/22/23 0809 03/23/23 0104  WBC 9.4  --  8.0 4.0 4.4 7.2 7.1  NEUTROABS 7.2  --   --   --   --  5.4 4.6  HGB 11.4*   < > 9.8* 10.5* 11.3* 12.0 11.7*  HCT 34.5*   < > 31.3* 32.8* 35.8* 37.3 37.3  MCV 87.8  --  87.4 87.9 89.5 88.8 87.4  PLT 212  --  198 204 223 223 212   < > = values in this interval not displayed.   Basic Metabolic Panel: Recent Labs  Lab 03/18/23 0044 03/19/23 0958 03/20/23 0148 03/21/23 0233 03/22/23 0809 03/23/23 0104  NA  --  143 138 141 139 140  K  --  3.0* 2.8* 3.4* 3.5 4.1  CL  --  111 110 111 109 107  CO2  --  22 21* 21* 24 25  GLUCOSE  --  84 128* 86 125* 86  BUN  --  17 12 5* <5* 6  CREATININE  --  0.67 0.62 0.59 0.76 0.74  CALCIUM  --  8.0* 8.1* 8.1* 8.6* 8.6*  MG 2.1 1.8  --   --   --   --   PHOS 2.7  --   --   --   --   --    GFR: Estimated Creatinine Clearance: 101.5 mL/min (by C-G formula based on SCr of 0.74 mg/dL). Liver Function Tests: Recent Labs  Lab 03/17/23 1651 03/19/23 0407 03/20/23 0148 03/22/23 0809 03/23/23 0104  AST 26 35 25 14* 15  ALT 15 15 15 15 16   ALKPHOS 37* 32* 32* 35* 36*  BILITOT 0.6 0.7 0.4 0.3 0.7  PROT 5.8* 5.3* 5.3* 5.4* 5.5*  ALBUMIN 3.0* 2.9* 3.0* 3.0* 3.0*   No results for input(s): "LIPASE", "AMYLASE" in the last 168 hours. Recent Labs  Lab 03/20/23 1022  AMMONIA  30   Coagulation Profile: Recent Labs  Lab 03/19/23 1447 03/20/23 0148 03/21/23 0233 03/22/23 0205 03/23/23 0104  INR 1.1 1.2 1.1 1.1 1.4*   Cardiac Enzymes: No results for input(s): "CKTOTAL", "CKMB", "CKMBINDEX", "TROPONINI" in the last 168 hours. BNP (last 3 results) No results for input(s): "PROBNP" in  the last 8760 hours. HbA1C: No results for input(s): "HGBA1C" in the last 72 hours. CBG: Recent Labs  Lab 03/19/23 0314 03/19/23 0406 03/19/23 0756 03/19/23 1126 03/19/23 1609  GLUCAP 63* 124* 83 83 80   Lipid Profile: No results for input(s): "CHOL", "HDL", "LDLCALC", "TRIG", "CHOLHDL", "LDLDIRECT" in the last 72 hours. Thyroid Function Tests: No results for input(s): "TSH", "T4TOTAL", "FREET4", "T3FREE", "THYROIDAB" in the last 72 hours.  Anemia Panel: No results for input(s): "VITAMINB12", "FOLATE", "FERRITIN", "TIBC", "IRON", "RETICCTPCT" in the last 72 hours.  Sepsis Labs: Recent Labs  Lab 03/17/23 0720 03/17/23 0912 03/17/23 0942  PROCALCITON  --  <0.10  --   LATICACIDVEN 0.8  --  1.5    Recent Results (from the past 240 hour(s))  Blood culture (routine x 2)     Status: None   Collection Time: 03/17/23  7:20 AM   Specimen: BLOOD LEFT WRIST  Result Value Ref Range Status   Specimen Description BLOOD LEFT WRIST  Final   Special Requests   Final    BOTTLES DRAWN AEROBIC AND ANAEROBIC Blood Culture adequate volume   Culture   Final    NO GROWTH 5 DAYS Performed at [facility], [address] [place], [place] [address]    Report Status 03/22/2023 FINAL  Final  Blood culture (routine x 2)     Status: None   Collection Time: 03/17/23  7:27 AM   Specimen: BLOOD RIGHT HAND  Result Value Ref Range Status   Specimen Description BLOOD RIGHT HAND  Final   Special Requests   Final    BOTTLES DRAWN AEROBIC AND ANAEROBIC Blood Culture adequate volume   Culture   Final    NO GROWTH 5 DAYS Performed at [facility], [address],  [place], [place] [address]    Report Status 03/22/2023 FINAL  Final  Urine Culture (for pregnant, neutropenic or urologic patients or patients with an indwelling urinary catheter)     Status: None   Collection Time: 03/17/23  9:10 AM   Specimen: Urine, Clean Catch  Result Value Ref Range Status   Specimen Description URINE, CLEAN CATCH  Final   Special Requests NONE  Final   Culture   Final    NO GROWTH Performed at [facility], [address], [place], [place] [address]    Report Status 03/18/2023 FINAL  Final  MRSA Next Gen by PCR, Nasal     Status: None   Collection Time: 03/17/23  5:12 PM   Specimen: Nasal Mucosa; Nasal Swab  Result Value Ref Range Status   MRSA by PCR Next Gen NOT DETECTED NOT DETECTED Final    Comment: (NOTE) The GeneXpert MRSA Assay (FDA approved for NASAL specimens only), is one component of a comprehensive MRSA colonization surveillance program. It is not intended to diagnose MRSA infection nor to guide or monitor treatment for MRSA infections. Test performance is not FDA approved in patients less than 72 years old. Performed at [facility], [address], [place], [place] [address]     Radiology Studies: No results found.  Scheduled Meds:  Chlorhexidine Gluconate Cloth  6 each Topical Daily   citalopram  20 mg Oral Daily   enoxaparin (LOVENOX) injection  70 mg Subcutaneous Q12H   multivitamin with minerals  1 tablet Oral Daily   nicotine  14 mg Transdermal Daily   OLANZapine  20 mg Oral QHS   mouth rinse  15 mL Mouth Rinse  4 times per day   Warfarin - Pharmacist Dosing Inpatient   Does not apply q1600   Continuous Infusions:  sodium chloride     sodium chloride      LOS: 6 days   Marguerita Merles, DO [facility] Available via Epic secure chat 7am-7pm After these hours, please refer to coverage provider listed on [email or web address] 03/23/2023, 5:32 PM

## 2023-03-23 NOTE — Progress Notes (Signed)
Physical Therapy Treatment Patient Details Name: KEYLIN IMRAN MRN: [id number] DOB: Jun 20, 1976 Today's Date: 03/23/2023   History of Present Illness Pt is 47 yo female who presented to hospital on 03/17/23 with delirium and required Precedex and admission to ICU.  Pt able to wean off Precedex and able to provide hx of symptoms of dysarthria and dizziness for 2-3 weeks.  Pt with metabolic encephalopathy.  MRI is pending.  Pt with hx of Marfan's syndrome, hx MVR and AVR on warfarin, NICM/sCHF and depression    PT Comments    Pt received in supine and agreeable to session. Orthostatic vitals taken with abdominal binder donned, see below. Pt able to perform all mobility with up to min guard for safety this session. Pt reporting dizziness ~2 mins after standing at EOB and at the end of the gait trial, which improved once sitting. Pt demonstrating slow gait with focus on heel strike and improved step-through pattern. Pt's bed left in chair position at the end of the session with instructions to call nursing if experiencing any symptoms, RN notified. Pt continues to benefit from PT services to progress toward functional mobility goals.   Orthostatic BPs  Supine 128/65  Sitting 102/61  Standing 125/81  Standing after 3 min 87/69  Sitting after standing trial 97/75  Chair position at end of session 104/74       Recommendations for follow up therapy are one component of a multi-disciplinary discharge planning process, led by the attending physician.  Recommendations may be updated based on patient status, additional functional criteria and insurance authorization.     Assistance Recommended at Discharge Intermittent Supervision/Assistance  Patient can return home with the following A little help with bathing/dressing/bathroom;A little help with walking and/or transfers;Assistance with cooking/housework;Help with stairs or ramp for entrance   Equipment Recommendations  Rolling walker (2  wheels);BSC/3in1    Recommendations for Other Services       Precautions / Restrictions Precautions Precautions: Fall Precaution Comments: watch BP Restrictions Weight Bearing Restrictions: No     Mobility  Bed Mobility Overal bed mobility: Needs Assistance Bed Mobility: Supine to Sit, Sit to Supine     Supine to sit: Supervision Sit to supine: Supervision        Transfers Overall transfer level: Needs assistance Equipment used: Rolling walker (2 wheels) Transfers: Sit to/from Stand Sit to Stand: Supervision           General transfer comment: from EOB with supervision for safety    Ambulation/Gait Ambulation/Gait assistance: Min guard Gait Distance (Feet): 20 Feet Assistive device: Rolling walker (2 wheels) Gait Pattern/deviations: Step-through pattern Gait velocity: decreased     General Gait Details: Pt demonstrating slow step-through pattern. cues for heel strike with pt able to improve       Balance Overall balance assessment: Needs assistance Sitting-balance support: Feet supported, No upper extremity supported Sitting balance-Leahy Scale: Normal Sitting balance - Comments: sitting EOB   Standing balance support: No upper extremity supported, Single extremity supported Standing balance-Leahy Scale: Fair Standing balance comment: with RW support                            Cognition Arousal/Alertness: Awake/alert Behavior During Therapy: WFL for tasks assessed/performed Overall Cognitive Status: Within Functional Limits for tasks assessed  Exercises General Exercises - Lower Extremity Hip Flexion/Marching: AROM, Both, 10 reps, Standing    General Comments General comments (skin integrity, edema, etc.): Orthostatic vitals taken with abdominal binder donned. Pt reporting a headache upon sitting and dizziness after standing for 3 mins that improved once sitting. Pt able to  walk to the door and reported dizziness halfway back to the bed.      Pertinent Vitals/Pain Pain Assessment Pain Assessment: Faces Faces Pain Scale: Hurts little more Pain Location: neck Pain Descriptors / Indicators: Aching, Sore Pain Intervention(s): Monitored during session     PT Goals (current goals can now be found in the care plan section) Acute Rehab PT Goals Patient Stated Goal: get back to her dogs PT Goal Formulation: With patient Time For Goal Achievement: 04/03/23 Potential to Achieve Goals: Good Progress towards PT goals: Progressing toward goals    Frequency    Min 4X/week      PT Plan Current plan remains appropriate       AM-PAC PT "6 Clicks" Mobility   Outcome Measure  Help needed turning from your back to your side while in a flat bed without using bedrails?: None Help needed moving from lying on your back to sitting on the side of a flat bed without using bedrails?: None Help needed moving to and from a bed to a chair (including a wheelchair)?: A Little Help needed standing up from a chair using your arms (e.g., wheelchair or bedside chair)?: A Little Help needed to walk in hospital room?: A Little Help needed climbing 3-5 steps with a railing? : Total 6 Click Score: 18    End of Session Equipment Utilized During Treatment: Gait belt Activity Tolerance: Patient tolerated treatment well Patient left: with call bell/phone within reach;in bed Nurse Communication: Mobility status PT Visit Diagnosis: Unsteadiness on feet (R26.81);Other abnormalities of gait and mobility (R26.89)     Time: 0981-1914 PT Time Calculation (min) (ACUTE ONLY): 29 min  Charges:  $Gait Training: 8-22 mins $Therapeutic Activity: 8-22 mins                     Johny Shock, PTA Acute Rehabilitation Services Secure Chat Preferred  [phone]    Johny Shock 03/23/2023, 3:59 PM

## 2023-03-24 DIAGNOSIS — I951 Orthostatic hypotension: Secondary | ICD-10-CM

## 2023-03-24 LAB — CBC WITH DIFFERENTIAL/PLATELET
Abs Immature Granulocytes: 0.03 10*3/uL (ref 0.00–0.07)
Basophils Absolute: 0 10*3/uL (ref 0.0–0.1)
Basophils Relative: 0 %
Eosinophils Absolute: 0.1 10*3/uL (ref 0.0–0.5)
Eosinophils Relative: 1 %
HCT: 37.2 % (ref 36.0–46.0)
Hemoglobin: 11.8 g/dL — ABNORMAL LOW (ref 12.0–15.0)
Immature Granulocytes: 0 %
Lymphocytes Relative: 18 %
Lymphs Abs: 1.3 10*3/uL (ref 0.7–4.0)
MCH: 27.8 pg (ref 26.0–34.0)
MCHC: 31.7 g/dL (ref 30.0–36.0)
MCV: 87.5 fL (ref 80.0–100.0)
Monocytes Absolute: 0.8 10*3/uL (ref 0.1–1.0)
Monocytes Relative: 11 %
Neutro Abs: 5.1 10*3/uL (ref 1.7–7.7)
Neutrophils Relative %: 70 %
Platelets: 203 10*3/uL (ref 150–400)
RBC: 4.25 MIL/uL (ref 3.87–5.11)
RDW: 15.7 % — ABNORMAL HIGH (ref 11.5–15.5)
WBC: 7.4 10*3/uL (ref 4.0–10.5)
nRBC: 0 % (ref 0.0–0.2)

## 2023-03-24 LAB — COMPREHENSIVE METABOLIC PANEL
ALT: 25 U/L (ref 0–44)
AST: 26 U/L (ref 15–41)
Albumin: 2.9 g/dL — ABNORMAL LOW (ref 3.5–5.0)
Alkaline Phosphatase: 34 U/L — ABNORMAL LOW (ref 38–126)
Anion gap: 12 (ref 5–15)
BUN: <5 mg/dL — ABNORMAL LOW (ref 6–20)
CO2: 24 mmol/L (ref 22–32)
Calcium: 8.5 mg/dL — ABNORMAL LOW (ref 8.9–10.3)
Chloride: 105 mmol/L (ref 98–111)
Creatinine, Ser: 0.84 mg/dL (ref 0.44–1.00)
GFR, Estimated: >60 mL/min (ref >60)
Glucose, Bld: 91 mg/dL (ref 70–99)
Potassium: 3.6 mmol/L (ref 3.5–5.1)
Sodium: 141 mmol/L (ref 135–145)
Total Bilirubin: 0.4 mg/dL (ref 0.3–1.2)
Total Protein: 5.5 g/dL — ABNORMAL LOW (ref 6.5–8.1)

## 2023-03-24 LAB — PROTIME-INR
INR: 1.9 — ABNORMAL HIGH (ref 0.8–1.2)
Prothrombin Time: 21.6 seconds — ABNORMAL HIGH (ref 11.4–15.2)

## 2023-03-24 LAB — PHOSPHORUS: Phosphorus: 3.1 mg/dL (ref 2.5–4.6)

## 2023-03-24 LAB — MAGNESIUM: Magnesium: 1.8 mg/dL (ref 1.7–2.4)

## 2023-03-24 MED ORDER — SODIUM CHLORIDE 0.9 % IV BOLUS
500.0000 mL | Freq: Once | INTRAVENOUS | Status: AC
  Administered 2023-03-24: 500 mL via INTRAVENOUS

## 2023-03-24 MED ORDER — WARFARIN SODIUM 5 MG PO TABS
5.0000 mg | ORAL_TABLET | Freq: Once | ORAL | Status: AC
  Administered 2023-03-24: 5 mg via ORAL
  Filled 2023-03-24: qty 1

## 2023-03-24 MED ORDER — MIDODRINE HCL 5 MG PO TABS
5.0000 mg | ORAL_TABLET | Freq: Three times a day (TID) | ORAL | Status: DC
  Administered 2023-03-24 – 2023-03-25 (×3): 5 mg via ORAL
  Filled 2023-03-24 (×3): qty 1

## 2023-03-24 NOTE — Progress Notes (Signed)
Mobility Specialist Progress Note:    03/24/23 1056  Orthostatic Lying   BP- Lying 110/68  Pulse- Lying (!) 46  Orthostatic Sitting  BP- Sitting (!) 119/94  Pulse- Sitting (!) 46  Orthostatic Standing at 0 minutes  BP- Standing at 0 minutes (!) 79/63  Pulse- Standing at 0 minutes 75  Mobility  Activity Stood at bedside  Level of Assistance Standby assist, set-up cues, supervision of patient - no hands on  Assistive Device Front wheel walker  Activity Response Tolerated well  Mobility Referral Yes  $Mobility charge 1 Mobility  Mobility Specialist Start Time (ACUTE ONLY) 1015  Mobility Specialist Stop Time (ACUTE ONLY) 1039  Mobility Specialist Time Calculation (min) (ACUTE ONLY) 24 min   Pt received in bed, agreeable to ambulate. Pt attempted standing BP but had to sit down d/t dizziness. Pt was agreeable to try again, able to get a standing BP of 79/63. Unable to continue session d/t symptomatic hypotension. Pt assisted back to bed w/ call bell and personal belongings at hand.   Thompson Grayer Mobility Specialist  Please contact vis Secure Chat or  Rehab Office [phone]

## 2023-03-24 NOTE — Progress Notes (Signed)
Speech Language Pathology Treatment: Cognitive-Linquistic  Patient Details Name: Melissa Hayden MRN: [id number] DOB: [birth year] Today's Date: 03/24/2023 Time: 2952-8413 SLP Time Calculation (min) (ACUTE ONLY): 25 min  Assessment / Plan / Recommendation Clinical Impression  Pt seen for aphasia therapy. She has made significant improvements since initial assessment and states she still has some dysarthria at random when her "left side of mouth is weak". Her speech is 100% intelligible. She states she has not had difficulty with her expressive language (word finding) in approximately 3 days and states it has improved. Throughout session she did not demonstrate dysnomia or difficulty expressing her thoughts in conversation. She named 11 items in category in one minute. She generated a paragraph given topic with appropriate semantics, grammar with one misspelled word. Strategies were introduced and provided examples if she were to experience dynomia or hesitancy including using description, synonyms, imagery, the alphabet. From a cognitive standpoint through informal means, she seemed appropriate in her attention, problem solving and reasoning. She is functional for this setting and will discharge pt from ST. Pt works full time and may benefit from outpatient therapy if she has difficulty transiting to home or work environment.    HPI HPI: 47 year old chronically ill-appearing female patient is brought in by EMS on 6/6 for slurred speech confusion, and dizziness.  Last seen normal earlier that evening.  No fever cough rashes chest pain.  Reported headache earlier in the evening.  Was able to provide initial history. Was added in as a code stroke, became progressively confused while in the emergency room CT head was negative for acute findings, there were arterial manifestations of connective tissue disease with right ICA beading and left ICA fusiform aneurysm, the CT chest was negative for aortic dissection  there is a small wedge-shaped region in the upper pole of the right kidney there was a 3.1 cm suprarenal aortic aneurysm. Now c/o delayed swallow sensation, slowed processing, difficulty finding words.      SLP Plan  All goals met;Discharge SLP treatment due to (comment) (functional in this setting)      Recommendations for follow up therapy are one component of a multi-disciplinary discharge planning process, led by the attending physician.  Recommendations may be updated based on patient status, additional functional criteria and insurance authorization.    Recommendations                     Oral care BID     Aphasia (R47.01)     All goals met;Discharge SLP treatment due to (comment) (functional in this setting)     Royce Macadamia  03/24/2023, 1:45 PM

## 2023-03-24 NOTE — Progress Notes (Signed)
Occupational Therapy Treatment Patient Details Name: Melissa Hayden MRN: [id number] DOB: 29-Feb-1976 Today's Date: 03/24/2023   History of present illness Pt is 47 yo female who presented to hospital on 03/17/23 with delirium and required Precedex and admission to ICU.  Pt able to wean off Precedex and able to provide hx of symptoms of dysarthria and dizziness for 2-3 weeks.  Pt with metabolic encephalopathy.  MRI is pending.  Pt with hx of Marfan's syndrome, hx MVR and AVR on warfarin, NICM/sCHF and depression   OT comments  Pt continuing to progress with OT sessions, dizziness continues to persist with functional ambulation. Pt ambulating with supervision + RW, does have mild LOB upon transitions but pt is able to self correct. Min cues for hand placement with RW STS for pt to maintain balance better when transitioning. Educated pt on tub transfers via wall waling and tub bench, pt trialed both strategies. Discussed the need for some form of sitting support in tub due to balance deficits, pt unsure which one will fit but wanted to think over tub bench vs tub seat. Also discussed with patient the possibilities of receiving a vestibular check up to further evaluate dizziness. OT to continuing to progress pt as able, DC plans updated to [facility] to maximize pt's functional independence in home as patient no longer looking for AIR.    Recommendations for follow up therapy are one component of a multi-disciplinary discharge planning process, led by the attending physician.  Recommendations may be updated based on patient status, additional functional criteria and insurance authorization.    Assistance Recommended at Discharge Intermittent Supervision/Assistance  Patient can return home with the following  A little help with walking and/or transfers;A little help with bathing/dressing/bathroom;Assist for transportation;Assistance with cooking/housework;Help with stairs or ramp for entrance   Equipment  Recommendations  Tub/shower bench;BSC/3in1;Tub/shower seat (Pt trialed both tub seat and tub bench, she will f/u with personal preference to determine which fits in her tub)    Recommendations for Other Services      Precautions / Restrictions Precautions Precautions: Fall Precaution Comments: watch BP Restrictions Weight Bearing Restrictions: No       Mobility Bed Mobility Overal bed mobility: Modified Independent                  Transfers Overall transfer level: Needs assistance Equipment used: Rolling walker (2 wheels) Transfers: Sit to/from Stand Sit to Stand: Supervision           General transfer comment: STSx3 (EOB, tub bench, tub seat)     Balance Overall balance assessment: Needs assistance Sitting-balance support: Feet supported, No upper extremity supported Sitting balance-Leahy Scale: Normal Sitting balance - Comments: sitting EOB   Standing balance support: No upper extremity supported, Single extremity supported Standing balance-Leahy Scale: Fair                             ADL either performed or assessed with clinical judgement   ADL Overall ADL's : Needs assistance/impaired                                 Tub/ Shower Transfer: Ambulation;Rolling walker (2 wheels);Supervision/safety Tub/Shower Transfer Details (indicate cue type and reason): Educated pt on the use of wall support to complete tub transfer, also demonstrate tub transfer with tub bench. Pt trialed each functional transfer with min cueing for safety and positioning  Functional mobility during ADLs: Supervision/safety;Rolling walker (2 wheels) General ADL Comments: Pt ambulated in hall ~69ft with RW    Extremity/Trunk Assessment              Vision       Perception     Praxis      Cognition Arousal/Alertness: Awake/alert   Overall Cognitive Status: Within Functional Limits for tasks assessed                                           Exercises      Shoulder Instructions       General Comments TED hose donned during session. Pt continues with dizziness upon ambulation    Pertinent Vitals/ Pain       Pain Assessment Pain Assessment: No/denies pain  Home Living                                          Prior Functioning/Environment              Frequency  Min 2X/week        Progress Toward Goals  OT Goals(current goals can now be found in the care plan section)  Progress towards OT goals: Progressing toward goals  Acute Rehab OT Goals Patient Stated Goal: to get stronger OT Goal Formulation: With patient Time For Goal Achievement: 04/04/23 Potential to Achieve Goals: Good  Plan Frequency remains appropriate;Discharge plan needs to be updated    Co-evaluation                 AM-PAC OT "6 Clicks" Daily Activity     Outcome Measure   Help from another person eating meals?: None Help from another person taking care of personal grooming?: A Little Help from another person toileting, which includes using toliet, bedpan, or urinal?: A Little Help from another person bathing (including washing, rinsing, drying)?: A Little Help from another person to put on and taking off regular upper body clothing?: None Help from another person to put on and taking off regular lower body clothing?: A Little 6 Click Score: 20    End of Session Equipment Utilized During Treatment: Gait belt;Rolling walker (2 wheels)  OT Visit Diagnosis: Unsteadiness on feet (R26.81);Other abnormalities of gait and mobility (R26.89);Muscle weakness (generalized) (M62.81)   Activity Tolerance Patient tolerated treatment well   Patient Left in bed;with call bell/phone within reach   Nurse Communication Mobility status        Time: 1610-9604 OT Time Calculation (min): 22 min  Charges: OT General Charges $OT Visit: 1 Visit OT Treatments $Therapeutic Activity: 8-22  mins  03/24/2023  AB, OTR/L  Acute Rehabilitation Services  Office: [phone]   Tristan Schroeder 03/24/2023, 6:09 PM

## 2023-03-24 NOTE — Progress Notes (Signed)
PROGRESS NOTE    Melissa Hayden  WUJ:[id number] DOB: 1975/11/04 DOA: 03/17/2023 PCP: Janeece Agee, NP   Brief Narrative:  Melissa Hayden is a 47 y.o. F with Marfan's syndrome, hx MVR and AVR on warfarin, hx NICM/sCHF EF30-35% recovered to 50%, and depression who presented with 3 weeks confusion.  On admission, the patient was progressively delirious and required Precedex and admission to the ICU.  She was ultimately able to wean off Precedex and able to give coherent history that her symptoms were dysarthria and dizziness starting 2-3 weeks PTA.  These symptoms were initially severe, but improved slightly and she didn't seek care because she was afraid, but eventually developed severe headache that prompted her to come to the ER. Underwent MRI and Neurology evaluation.  **Her mentation is much improved now but now hospitalization has been complicated by significant orthostatic hypotension to the point where she gets near syncopal  Assessment and Plan:  Acute Metabolic Encephalopathy, improved -Unclear precipitant.   -She was disoriented, not paranoid, hallucinating.  She denies alcohol use or other drugs.   -She was treated for infection here initially, but had no fever, leukocytosis, abx were stopped and she has had no clinical change. -Mental status appears to be back to baseline, intermittently patient does have some dysarthria. -Evaluated with an MRI which showed small foci of susceptibility artifact in the high left frontoparietal region and right cerebral hemisphere which may reflect chronic microhemorrhages or small cavernoma's but no acute stroke or subacute stroke identified. -Consulted Neurology, appreciate recommendations, advised outpatient follow-up MRI and to continue Coumadin.  Dysarthria -There is no tongue swelling.   -She reported that this started several weeks prior to admission (not the night before as some notes indicate).  It was initially more severe, then improved a  little, but is still present. -Patient reports intermittent dysarthria nurse also test the same.  No difficulty with swallowing. -Evaluated with an MRI which showed small foci of susceptibility artifact in the high left frontoparietal region and right cerebral hemisphere which may reflect chronic microhemorrhages or small cavernoma's but no acute stroke or subacute stroke identified. -Consulted Neurology and appreciated recommendations, advised outpatient follow-up MRI and to continue Coumadin.  Elevated TSH -Check Free T4 this is pending -TSH was 7.074   Renal Infarct -There was initial concern that this represented infection, but UA normal, UCx no growth.  Suspect small infarct due to missed warfarin. -Renal U/S done and showed "No collecting system dilatation. Subtle area of echogenicity in the upper pole of the right kidney corresponding to the finding by prior CT. If needed for follow up a postcontrast CT may provide further sensitivity when clinically appropriate." -Continue warfarin -Continue Lovenox bridge -INR is still subtherapeutic, Coumadin dosed by pharmacy   History of aortic valve and mitral valve replacement in the setting of Marfan syndrome -Had run out of warfarin a few weeks prior to admission due to financial issues (lost car, couldn't get to pharmacy) -Continue Warfarin -Changed Heparin bridge to Enoxaparin  -Pharmacy dosing warfarin, INR still subtherapeutic but improving as PT/INR was 21.6/1.9   Dizziness and Orthostatic Hypotension -Secondary to orthostasis from dehydration -IV fluid bolus ordered and given 1 Liter the day before yesterday and 500 mL x2 again today and will add midodrine -Order TED Hose and Abdominal Binder -Patient's BP dropped from 110/68 supine -> 119/94 sitting and then went to 79/63 standing she was symptomatic   Hypokalemia, improved -Patient's K+ Level Trend: Recent Labs  Lab 03/17/23 1651 03/19/23 9562  03/20/23 0148 03/21/23 0233  03/22/23 0809 03/23/23 0104 03/24/23 0339  K 4.2 3.0* 2.8* 3.4* 3.5 4.1 3.6  -Continue to Monitor and Replete as Necessary -Repeat CMP in the AM   Depression and History of Bipolar Disorder -Continue Citalopram 20 mg po Daily and Olanzapine 20 mg po qHS -Psychiatry was consulted and recommending referring the patient to Dr. Lolly Mustache at [facility] outpatient clinic after discharge and discharge the patient with 1 month supply of olanzapine and Celexa for continuity of care  Normocytic Anemia -Hgb/Hct Trend: Recent Labs  Lab 03/17/23 0656 03/18/23 0044 03/20/23 0148 03/21/23 0233 03/22/23 0809 03/23/23 0104 03/24/23 0339  HGB 10.9* 9.8* 10.5* 11.3* 12.0 11.7* 11.8*  HCT 32.0* 31.3* 32.8* 35.8* 37.3 37.3 37.2  MCV  --  87.4 87.9 89.5 88.8 87.4 87.5  -Check Anemia Panel in the AM -Continue to Monitor for S/Sx of Bleeding given that patient is on Coumadin and on a Lovenox Bridge; No overt bleeding noted -Repeat CBC in the AM   Chronic Systolic Congestive Heart Failure -Appears compensated.   -Not on diuretics at home.  EF this admission recovered to 50-55%.   -Strict I's and O's and Daily Weights  Intake/Output Summary (Last 24 hours) at 03/24/2023 1755 Last data filed at 03/24/2023 1616 Gross per 24 hour  Intake 251.94 ml  Output 5350 ml  Net -5098.06 ml  -Patient is -10.481 Liters since Admit -Continue to Monitor for S/Sx of Volume Overload  Hypoalbuminemia -Patient's Albumin Trend: Recent Labs  Lab 03/17/23 0912 03/17/23 1651 03/19/23 0407 03/20/23 0148 03/22/23 0809 03/23/23 0104 03/24/23 0339  ALBUMIN 3.4* 3.0* 2.9* 3.0* 3.0* 3.0* 2.9*  -Continue to Monitor and Trend and repeat CMP in the AM  DVT prophylaxis: Place TED hose Start: 03/23/23 1011 Anticoagulated with Coumadin and Lovenox Bridge    Code Status: Full Code Family Communication: No family present at bedside  Disposition Plan:  Level of care: Med-Surg Status is: Inpatient Remains inpatient appropriate  because: Remains significantly orthostatic and unsafe to D/C; Declines Inpatient Rehab   Consultants:  Neurology Psychiatry  Procedures:  As delineated as above  Antimicrobials:  Anti-infectives (From admission, onward)    Start     Dose/Rate Route Frequency Ordered Stop   03/17/23 1600  cefTRIAXone (ROCEPHIN) 2 g in sodium chloride 0.9 % 100 mL IVPB  Status:  Discontinued        2 g 200 mL/hr over 30 Minutes Intravenous Every 24 hours 03/17/23 1049 03/19/23 1226   03/17/23 0715  vancomycin (VANCOCIN) IVPB 1000 mg/200 mL premix        1,000 mg 200 mL/hr over 60 Minutes Intravenous  Once 03/17/23 0712 03/17/23 0914   03/17/23 0715  ceFEPIme (MAXIPIME) 2 g in sodium chloride 0.9 % 100 mL IVPB        2 g 200 mL/hr over 30 Minutes Intravenous  Once 03/17/23 1610 03/17/23 0830       Subjective: Seen and examined at bedside and the patient remains significantly orthostatic.  Mentation is improved but he is awoken from sleep and a little confused.  Wanting to go home.  No other concerns or complaints at this time.  Objective: Vitals:   03/24/23 0555 03/24/23 0742 03/24/23 0750 03/24/23 1216  BP: (!) 105/59 (!) 108/59 114/78 (!) 93/59  Pulse: (!) 52 85  60  Resp: 15 18 18 18   Temp: 98.2 F (36.8 C) 98.3 F (36.8 C) 99.6 F (37.6 C) 98.5 F (36.9 C)  TempSrc:  Oral  SpO2: 98% 94% 95% 95%  Weight:      Height:        Intake/Output Summary (Last 24 hours) at 03/24/2023 1756 Last data filed at 03/24/2023 1616 Gross per 24 hour  Intake 251.94 ml  Output 5350 ml  Net -5098.06 ml   Filed Weights   03/19/23 0454 03/21/23 0510 03/22/23 0555  Weight: 76.1 kg 73.2 kg 73.2 kg   Examination: Physical Exam:  Constitutional: Thin Caucasian female in no acute distress appears calm but continues to complain of some dizziness and states that she almost passed out in the bathroom Respiratory: Diminished to auscultation bilaterally, no wheezing, rales, rhonchi or crackles. Normal  respiratory effort and patient is not tachypenic. No accessory muscle use.  Unlabored breathing Cardiovascular: RRR, no murmurs / rubs / gallops. S1 and S2 auscultated. No extremity edema Abdomen: Soft, non-tender, non-distended.  Bowel sounds positive.  GU: Deferred. Musculoskeletal: No clubbing / cyanosis of digits/nails. No joint deformity upper and lower extremities.  Skin: No rashes, lesions, ulcers on limited skin evaluation. No induration; Warm and dry.  Neurologic: CN 2-12 grossly intact with no focal deficits. Romberg sign and cerebellar reflexes not assessed.  Psychiatric: Is a little somnolent and drowsy but easily arousable and a little confused initially but then became more oriented  Data Reviewed: I have personally reviewed following labs and imaging studies  CBC: Recent Labs  Lab 03/20/23 0148 03/21/23 0233 03/22/23 0809 03/23/23 0104 03/24/23 0339  WBC 4.0 4.4 7.2 7.1 7.4  NEUTROABS  --   --  5.4 4.6 5.1  HGB 10.5* 11.3* 12.0 11.7* 11.8*  HCT 32.8* 35.8* 37.3 37.3 37.2  MCV 87.9 89.5 88.8 87.4 87.5  PLT 204 223 223 212 203   Basic Metabolic Panel: Recent Labs  Lab 03/18/23 0044 03/19/23 0958 03/19/23 0958 03/20/23 0148 03/21/23 0233 03/22/23 0809 03/23/23 0104 03/24/23 0339  NA  --  143   < > 138 141 139 140 141  K  --  3.0*   < > 2.8* 3.4* 3.5 4.1 3.6  CL  --  111   < > 110 111 109 107 105  CO2  --  22   < > 21* 21* 24 25 24   GLUCOSE  --  84   < > 128* 86 125* 86 91  BUN  --  17   < > 12 5* <5* 6 <5*  CREATININE  --  0.67   < > 0.62 0.59 0.76 0.74 0.84  CALCIUM  --  8.0*   < > 8.1* 8.1* 8.6* 8.6* 8.5*  MG 2.1 1.8  --   --   --   --   --  1.8  PHOS 2.7  --   --   --   --   --   --  3.1   < > = values in this interval not displayed.   GFR: Estimated Creatinine Clearance: 96.7 mL/min (by C-G formula based on SCr of 0.84 mg/dL). Liver Function Tests: Recent Labs  Lab 03/19/23 0407 03/20/23 0148 03/22/23 0809 03/23/23 0104 03/24/23 0339  AST 35  25 14* 15 26  ALT 15 15 15 16 25   ALKPHOS 32* 32* 35* 36* 34*  BILITOT 0.7 0.4 0.3 0.7 0.4  PROT 5.3* 5.3* 5.4* 5.5* 5.5*  ALBUMIN 2.9* 3.0* 3.0* 3.0* 2.9*   No results for input(s): "LIPASE", "AMYLASE" in the last 168 hours. Recent Labs  Lab 03/20/23 1022  AMMONIA 30   Coagulation Profile:  Recent Labs  Lab 03/20/23 0148 03/21/23 0233 03/22/23 0205 03/23/23 0104 03/24/23 0339  INR 1.2 1.1 1.1 1.4* 1.9*   Cardiac Enzymes: No results for input(s): "CKTOTAL", "CKMB", "CKMBINDEX", "TROPONINI" in the last 168 hours. BNP (last 3 results) No results for input(s): "PROBNP" in the last 8760 hours. HbA1C: No results for input(s): "HGBA1C" in the last 72 hours. CBG: Recent Labs  Lab 03/19/23 0314 03/19/23 0406 03/19/23 0756 03/19/23 1126 03/19/23 1609  GLUCAP 63* 124* 83 83 80   Lipid Profile: No results for input(s): "CHOL", "HDL", "LDLCALC", "TRIG", "CHOLHDL", "LDLDIRECT" in the last 72 hours. Thyroid Function Tests: No results for input(s): "TSH", "T4TOTAL", "FREET4", "T3FREE", "THYROIDAB" in the last 72 hours. Anemia Panel: No results for input(s): "VITAMINB12", "FOLATE", "FERRITIN", "TIBC", "IRON", "RETICCTPCT" in the last 72 hours. Sepsis Labs: No results for input(s): "PROCALCITON", "LATICACIDVEN" in the last 168 hours.   Recent Results (from the past 240 hour(s))  Blood culture (routine x 2)     Status: None   Collection Time: 03/17/23  7:20 AM   Specimen: BLOOD LEFT WRIST  Result Value Ref Range Status   Specimen Description BLOOD LEFT WRIST  Final   Special Requests   Final    BOTTLES DRAWN AEROBIC AND ANAEROBIC Blood Culture adequate volume   Culture   Final    NO GROWTH 5 DAYS Performed at [facility], [address] [place], [place] [address]    Report Status 03/22/2023 FINAL  Final  Blood culture (routine x 2)     Status: None   Collection Time: 03/17/23  7:27 AM   Specimen: BLOOD RIGHT HAND  Result Value Ref Range Status   Specimen  Description BLOOD RIGHT HAND  Final   Special Requests   Final    BOTTLES DRAWN AEROBIC AND ANAEROBIC Blood Culture adequate volume   Culture   Final    NO GROWTH 5 DAYS Performed at [facility], [address], [place], [place] [address]    Report Status 03/22/2023 FINAL  Final  Urine Culture (for pregnant, neutropenic or urologic patients or patients with an indwelling urinary catheter)     Status: None   Collection Time: 03/17/23  9:10 AM   Specimen: Urine, Clean Catch  Result Value Ref Range Status   Specimen Description URINE, CLEAN CATCH  Final   Special Requests NONE  Final   Culture   Final    NO GROWTH Performed at [facility], [address], [place], [place] [address]    Report Status 03/18/2023 FINAL  Final  MRSA Next Gen by PCR, Nasal     Status: None   Collection Time: 03/17/23  5:12 PM   Specimen: Nasal Mucosa; Nasal Swab  Result Value Ref Range Status   MRSA by PCR Next Gen NOT DETECTED NOT DETECTED Final    Comment: (NOTE) The GeneXpert MRSA Assay (FDA approved for NASAL specimens only), is one component of a comprehensive MRSA colonization surveillance program. It is not intended to diagnose MRSA infection nor to guide or monitor treatment for MRSA infections. Test performance is not FDA approved in patients less than 63 years old. Performed at [facility], [address], [place], [place] [address]     Radiology Studies: No results found.  Scheduled Meds:  Chlorhexidine Gluconate Cloth  6 each Topical Daily   citalopram  20 mg Oral Daily   enoxaparin (LOVENOX) injection  70 mg Subcutaneous Q12H   midodrine  5  mg Oral TID WC   multivitamin with minerals  1 tablet Oral Daily   nicotine  14 mg Transdermal Daily   OLANZapine  20 mg Oral QHS   mouth rinse  15 mL Mouth Rinse 4 times per day   Warfarin - Pharmacist Dosing Inpatient   Does not apply q1600   Continuous Infusions:  sodium chloride      LOS: 7 days   Marguerita Merles,  DO [facility] Available via Epic secure chat 7am-7pm After these hours, please refer to coverage provider listed on [email or web address] 03/24/2023, 5:56 PM

## 2023-03-24 NOTE — Progress Notes (Addendum)
ANTICOAGULATION CONSULT NOTE - Follow-up Consult  Pharmacy Consult for heparin >> Lovenox + warfarin Indication: aortic and mitral valve replacements setting of Marfan syndrome   Allergies  Allergen Reactions   Abilify [Aripiprazole] Other (See Comments)    "Flattened affect" "couldn't do anything while taking it"    Patient Measurements: Height: 6\' 2"  (188 cm) Weight: 73.2 kg (161 lb 6.4 oz) IBW/kg (Calculated) : 77.7 Heparin Dosing Weight: 70 kg  Vital Signs: Temp: 98.3 F (36.8 C) (06/13 0742) Temp Source: Oral (06/12 2354) BP: 108/59 (06/13 0742) Pulse Rate: 85 (06/13 0742)  Labs: Recent Labs    03/22/23 0205 03/22/23 0809 03/22/23 0809 03/23/23 0104 03/23/23 0914 03/24/23 0339  HGB  --  12.0   < > 11.7*  --  11.8*  HCT  --  37.3  --  37.3  --  37.2  PLT  --  223  --  212  --  203  LABPROT 14.0  --   --  17.7*  --  21.6*  INR 1.1  --   --  1.4*  --  1.9*  HEPARINUNFRC 0.34  --   --  0.27* 0.28*  --   CREATININE  --  0.76  --  0.74  --  0.84   < > = values in this interval not displayed.     Estimated Creatinine Clearance: 96.7 mL/min (by C-G formula based on SCr of 0.84 mg/dL).   Medical History: Past Medical History:  Diagnosis Date   Anticoagulant long-term use    after valve surgery for Marfan's syndrome   Anxiety    Ascending aorta dilatation (HCC)    Bipolar disorder (HCC)    Depression    Marfan's syndrome    Dr. Beverely Pace   Migraine    Mitral valve prolapse    Palpitations    Suicidal behavior    Tylenol Overdose   Varicose veins     Assessment: 47 yo female with history of aortic valve replacement setting of Marfan syndrome admitted with AMS. Patient previously on warfarin; however, has not taken in 3 months due to trouble obtaining medication. Pharmacy consulted to resume warfarin.   Last documented (2023) PTA regimen: warfarin 5mg  Monday, Wednesday, Friday, Saturday, Sunday and 7.5mg  on Tuesday, Thursday.  INR trend over past few days  1.1 >> 1.4 >> to 1.9 today. CBC stable.   Goal of Therapy:  Anti-Xa level 0.6-1 units/ml 4hrs after LMWH dose given at steady state as indicated INR goal 2.5-3.5 Monitor platelets by anticoagulation protocol: Yes   Plan:  Lovenox 1mg /kg q12h  (continue until INR > 2.5) Warfarin 5 mg PO x1 since decent jump in INR over past few days Daily CBC and INR Monitor for s/sx of bleeding Patient would benefit from [facility] pharmacy at discharge  Rexford Maus, PharmD, BCPS 03/24/2023 7:49 AM

## 2023-03-24 NOTE — Progress Notes (Addendum)
Inpatient Rehabilitation Admissions Coordinator   I met at bedside with patient . Noted she continues with BP issues with standing. She states she wants to talk with her doctor for she needs to get home and work. She does not want to pursue CIR admit. I explained that she currently is having symptomatic BP issues and needs to discuss with her MD. We will sign off as she does not want Cir admit, nor can she tolerate the intensity required. I will alert TOC.  Ottie Glazier, RN, MSN Rehab Admissions Coordinator [phone] 03/24/2023 2:12 PM

## 2023-03-25 ENCOUNTER — Other Ambulatory Visit (HOSPITAL_COMMUNITY): Payer: Self-pay

## 2023-03-25 DIAGNOSIS — R55 Syncope and collapse: Secondary | ICD-10-CM

## 2023-03-25 LAB — CBC WITH DIFFERENTIAL/PLATELET
Abs Immature Granulocytes: 0.04 10*3/uL (ref 0.00–0.07)
Basophils Absolute: 0 10*3/uL (ref 0.0–0.1)
Basophils Relative: 0 %
Eosinophils Absolute: 0.1 10*3/uL (ref 0.0–0.5)
Eosinophils Relative: 1 %
HCT: 35 % — ABNORMAL LOW (ref 36.0–46.0)
Hemoglobin: 11.5 g/dL — ABNORMAL LOW (ref 12.0–15.0)
Immature Granulocytes: 1 %
Lymphocytes Relative: 23 %
Lymphs Abs: 1.7 10*3/uL (ref 0.7–4.0)
MCH: 29 pg (ref 26.0–34.0)
MCHC: 32.9 g/dL (ref 30.0–36.0)
MCV: 88.2 fL (ref 80.0–100.0)
Monocytes Absolute: 1 10*3/uL (ref 0.1–1.0)
Monocytes Relative: 14 %
Neutro Abs: 4.6 10*3/uL (ref 1.7–7.7)
Neutrophils Relative %: 61 %
Platelets: 177 10*3/uL (ref 150–400)
RBC: 3.97 MIL/uL (ref 3.87–5.11)
RDW: 15.6 % — ABNORMAL HIGH (ref 11.5–15.5)
WBC: 7.4 10*3/uL (ref 4.0–10.5)
nRBC: 0 % (ref 0.0–0.2)

## 2023-03-25 LAB — COMPREHENSIVE METABOLIC PANEL
ALT: 21 U/L (ref 0–44)
AST: 17 U/L (ref 15–41)
Albumin: 2.8 g/dL — ABNORMAL LOW (ref 3.5–5.0)
Alkaline Phosphatase: 40 U/L (ref 38–126)
Anion gap: 10 (ref 5–15)
BUN: 7 mg/dL (ref 6–20)
CO2: 21 mmol/L — ABNORMAL LOW (ref 22–32)
Calcium: 8.2 mg/dL — ABNORMAL LOW (ref 8.9–10.3)
Chloride: 106 mmol/L (ref 98–111)
Creatinine, Ser: 0.77 mg/dL (ref 0.44–1.00)
GFR, Estimated: >60 mL/min (ref >60)
Glucose, Bld: 86 mg/dL (ref 70–99)
Potassium: 3.4 mmol/L — ABNORMAL LOW (ref 3.5–5.1)
Sodium: 137 mmol/L (ref 135–145)
Total Bilirubin: 0.6 mg/dL (ref 0.3–1.2)
Total Protein: 5.6 g/dL — ABNORMAL LOW (ref 6.5–8.1)

## 2023-03-25 LAB — CORTISOL-AM, BLOOD: Cortisol - AM: 3.1 ug/dL — ABNORMAL LOW (ref 6.7–22.6)

## 2023-03-25 LAB — PROTIME-INR
INR: 2 — ABNORMAL HIGH (ref 0.8–1.2)
Prothrombin Time: 23.1 seconds — ABNORMAL HIGH (ref 11.4–15.2)

## 2023-03-25 LAB — T4, FREE: Free T4: 0.56 ng/dL — ABNORMAL LOW (ref 0.61–1.12)

## 2023-03-25 LAB — PHOSPHORUS: Phosphorus: 3 mg/dL (ref 2.5–4.6)

## 2023-03-25 LAB — MAGNESIUM: Magnesium: 1.9 mg/dL (ref 1.7–2.4)

## 2023-03-25 MED ORDER — CITALOPRAM HYDROBROMIDE 20 MG PO TABS
ORAL_TABLET | ORAL | 0 refills | Status: AC
Start: 2023-03-25 — End: ?
  Filled 2023-03-25: qty 30, 30d supply, fill #0

## 2023-03-25 MED ORDER — DOCUSATE SODIUM 100 MG PO CAPS
100.0000 mg | ORAL_CAPSULE | Freq: Two times a day (BID) | ORAL | 0 refills | Status: AC | PRN
  Filled 2023-03-25: qty 10, 5d supply, fill #0

## 2023-03-25 MED ORDER — WARFARIN SODIUM 5 MG PO TABS
ORAL_TABLET | ORAL | 0 refills | Status: AC
Start: 2023-03-25 — End: ?
  Filled 2023-03-25: qty 35, 30d supply, fill #0

## 2023-03-25 MED ORDER — TRAZODONE HCL 150 MG PO TABS
150.0000 mg | ORAL_TABLET | Freq: Every day | ORAL | 0 refills | Status: AC
Start: 2023-03-25 — End: ?
  Filled 2023-03-25: qty 30, 30d supply, fill #0

## 2023-03-25 MED ORDER — ENOXAPARIN SODIUM 80 MG/0.8ML IJ SOSY
70.0000 mg | PREFILLED_SYRINGE | Freq: Two times a day (BID) | INTRAMUSCULAR | 0 refills | Status: AC
  Filled 2023-03-25: qty 11.2, 7d supply, fill #0

## 2023-03-25 MED ORDER — MIDODRINE HCL 5 MG PO TABS
5.0000 mg | ORAL_TABLET | Freq: Three times a day (TID) | ORAL | 0 refills | Status: AC
  Filled 2023-03-25: qty 90, 30d supply, fill #0

## 2023-03-25 MED ORDER — ADULT MULTIVITAMIN W/MINERALS CH
1.0000 | ORAL_TABLET | Freq: Every day | ORAL | 0 refills | Status: AC
  Filled 2023-03-25: qty 30, 30d supply, fill #0

## 2023-03-25 MED ORDER — OLANZAPINE 10 MG PO TABS
ORAL_TABLET | ORAL | 0 refills | Status: AC
Start: 2023-03-25 — End: ?
  Filled 2023-03-25: qty 60, 30d supply, fill #0

## 2023-03-25 MED ORDER — LEVOTHYROXINE SODIUM 25 MCG PO TABS: 25.0000 ug | ORAL_TABLET | Freq: Every day | ORAL | Status: DC

## 2023-03-25 MED ORDER — LEVOTHYROXINE SODIUM 25 MCG PO TABS
25.0000 ug | ORAL_TABLET | Freq: Every day | ORAL | 0 refills | Status: AC
  Filled 2023-03-25: qty 30, 30d supply, fill #0

## 2023-03-25 MED ORDER — POTASSIUM CHLORIDE CRYS ER 20 MEQ PO TBCR
40.0000 meq | EXTENDED_RELEASE_TABLET | Freq: Two times a day (BID) | ORAL | Status: DC
  Administered 2023-03-25: 40 meq via ORAL
  Filled 2023-03-25: qty 2

## 2023-03-25 MED ORDER — NICOTINE 14 MG/24HR TD PT24
14.0000 mg | MEDICATED_PATCH | Freq: Every day | TRANSDERMAL | 0 refills | Status: AC
  Filled 2023-03-25: qty 28, 28d supply, fill #0

## 2023-03-25 MED ORDER — WARFARIN SODIUM 5 MG PO TABS
5.0000 mg | ORAL_TABLET | Freq: Once | ORAL | Status: AC
  Administered 2023-03-25: 5 mg via ORAL
  Filled 2023-03-25: qty 1

## 2023-03-25 NOTE — TOC Transition Note (Signed)
Transition of Care [facility]) - CM/SW Discharge Note   Patient Details  Name: Melissa Hayden MRN: [id number] Date of Birth: 1976-03-31  Transition of Care [facility]) CM/SW Contact:  Epifanio Lesches, RN Phone Number: 03/25/2023, 2:16 PM   Clinical Narrative:     Pt left AMA. RW received under charity care/LOG with [facility] leadership approval.  TOC  PHARMACY delivered RX meds to pt bedside prior to leaving.  Post hospital follow-up noted on AVS.  Final next level of care: Home/Self Care     Patient Goals and CMS Choice      Discharge Placement                         Discharge Plan and Services Additional resources added to the After Visit Summary for                  DME Arranged: Walker rolling (charity care/ LOG given by [facility] leadership, RW received from [facility]) DME Agency: [employer]                  Social Determinants of Health (SDOH) Interventions SDOH Screenings   Depression (PHQ2-9): High Risk (11/13/2021)  Tobacco Use: High Risk (07/20/2022)     Readmission Risk Interventions     No data to display

## 2023-03-25 NOTE — Progress Notes (Signed)
Mobility Specialist Progress Note   03/25/23 1135  Therapy Vitals  Pulse Rate 78 (post ambulation)  BP 103/75  Patient Position (if appropriate) Sitting (Post ambulation)  Orthostatic Sitting  BP- Sitting (!) 86/64  Pulse- Sitting 57  Mobility  Activity Ambulated with assistance in hallway  Level of Assistance Standby assist, set-up cues, supervision of patient - no hands on  Assistive Device Front wheel walker  Distance Ambulated (ft) 200 ft  Range of Motion/Exercises Active;All extremities  Activity Response Tolerated well   Pre Ambulation:  HR 57, BP 86/64 During Ambulation: HR 35-120, Post Ambulation: HR 78,  BP 103/75  Patient received returning to recliner chair from the bathroom. Agreeable to participate with anticipation to be discharged soon. Obtained BP pre and post ambulation. No significant change as SBP/DBP increased post ambulation. Ambulated supervision with slow steady gait. Noted HR fluctuated 35-120 bpm during ambulation. Was asymptomatic throughout. Returned to room without incident. Was left in recliner with all needs met, call bell in reach.   [place] Elize Pinon, BS EXP Mobility Specialist Please contact via SecureChat or Rehab office at [phone]

## 2023-03-25 NOTE — Progress Notes (Signed)
Physical Therapy Treatment Patient Details Name: Melissa Hayden MRN: [id number] DOB: 1976-07-03 Today's Date: 03/25/2023   History of Present Illness Pt is 47 yo female who presented to hospital on 03/17/23 with delirium and required Precedex and admission to ICU.  Pt able to wean off Precedex and able to provide hx of symptoms of dysarthria and dizziness for 2-3 weeks.  Pt with metabolic encephalopathy.  MRI is pending.  Pt with hx of Marfan's syndrome, hx MVR and AVR on warfarin, NICM/sCHF and depression    PT Comments    Pt received in supine and agreeable to session. Pt able to perform all mobility with up to supervision for safety. Pt demonstrating improved stability with dynamic mobility. Pt reporting improved RLE function and control. Pt reporting no adverse symptoms during gait trial and BP noted to be 100/51 and HR 41 bpm when sitting in the recliner after gait trial. Discussed ways to monitor BP at home due to being asymptomatic at times. Pt continues to benefit from PT services to progress toward functional mobility goals.     Recommendations for follow up therapy are one component of a multi-disciplinary discharge planning process, led by the attending physician.  Recommendations may be updated based on patient status, additional functional criteria and insurance authorization.     Assistance Recommended at Discharge Intermittent Supervision/Assistance  Patient can return home with the following A little help with bathing/dressing/bathroom;A little help with walking and/or transfers;Assistance with cooking/housework;Help with stairs or ramp for entrance   Equipment Recommendations  Rolling walker (2 wheels);BSC/3in1    Recommendations for Other Services       Precautions / Restrictions Precautions Precautions: Fall Precaution Comments: watch BP Restrictions Weight Bearing Restrictions: No     Mobility  Bed Mobility Overal bed mobility: Modified Independent                   Transfers Overall transfer level: Modified independent   Transfers: Sit to/from Stand             General transfer comment: From EOB and mat table with no unsteadiness and good power up    Ambulation/Gait Ambulation/Gait assistance: Supervision Gait Distance (Feet): 200 Feet Assistive device: Rolling walker (2 wheels) Gait Pattern/deviations: Step-through pattern Gait velocity: decreased     General Gait Details: slow, steady step-through pattern with improved heel strike. Cues for upright posture with pt improving.       Balance Overall balance assessment: Needs assistance Sitting-balance support: Feet supported, No upper extremity supported Sitting balance-Leahy Scale: Normal Sitting balance - Comments: sitting EOB   Standing balance support: Bilateral upper extremity supported, During functional activity Standing balance-Leahy Scale: Fair Standing balance comment: with RW support                            Cognition Arousal/Alertness: Awake/alert Behavior During Therapy: WFL for tasks assessed/performed Overall Cognitive Status: Within Functional Limits for tasks assessed                                          Exercises      General Comments General comments (skin integrity, edema, etc.): Pt reporting no adverse symptoms throughout session      Pertinent Vitals/Pain Pain Assessment Pain Assessment: No/denies pain     PT Goals (current goals can now be found in the care plan  section) Acute Rehab PT Goals Patient Stated Goal: get back to her dogs PT Goal Formulation: With patient Time For Goal Achievement: 04/03/23 Potential to Achieve Goals: Good Progress towards PT goals: Progressing toward goals    Frequency    Min 4X/week      PT Plan Current plan remains appropriate       AM-PAC PT "6 Clicks" Mobility   Outcome Measure  Help needed turning from your back to your side while in a flat bed  without using bedrails?: None Help needed moving from lying on your back to sitting on the side of a flat bed without using bedrails?: None Help needed moving to and from a bed to a chair (including a wheelchair)?: None Help needed standing up from a chair using your arms (e.g., wheelchair or bedside chair)?: None Help needed to walk in hospital room?: A Little Help needed climbing 3-5 steps with a railing? : A Little 6 Click Score: 22    End of Session Equipment Utilized During Treatment: Gait belt Activity Tolerance: Patient tolerated treatment well Patient left: with call bell/phone within reach;in chair;with chair alarm set Nurse Communication: Mobility status PT Visit Diagnosis: Unsteadiness on feet (R26.81);Other abnormalities of gait and mobility (R26.89)     Time: 4098-1191 PT Time Calculation (min) (ACUTE ONLY): 24 min  Charges:  $Gait Training: 23-37 mins                     Johny Shock, PTA Acute Rehabilitation Services Secure Chat Preferred  [phone]    Johny Shock 03/25/2023, 10:46 AM

## 2023-03-25 NOTE — Discharge Summary (Signed)
Physician Discharge Summary   Patient: Melissa Hayden MRN: [id number] DOB: Feb 08, 1976  Admit date:     03/17/2023  Discharge date: 03/25/2023  Discharge Physician: Marguerita Merles, DO   PCP: Janeece Agee, NP   Recommendations at discharge:  {Tip this will not be part of the note when signed- Example include specific recommendations for outpatient follow-up, pending tests to follow-up on. (Optional):26781}  ***  Discharge Diagnoses: Principal Problem:   Acute metabolic encephalopathy Active Problems:   Bipolar disorder, mixed (HCC)  Resolved Problems:   * No resolved hospital problems. [facility] Course: [name] is a 47 y.o. F with Marfan's syndrome, hx MVR and AVR on warfarin, hx NICM/sCHF EF30-35% recovered to 50%, and depression who presented with 3 weeks confusion.  On admission, the patient was progressively delirious and required Precedex and admission to the ICU.  She was ultimately able to wean off Precedex and able to give coherent history that her symptoms were dysarthria and dizziness starting 2-3 weeks PTA.  These symptoms were initially severe, but improved slightly and she didn't seek care because she was afraid, but eventually developed severe headache that prompted her to come to the ER. Underwent MRI and Neurology evaluation.  **Her mentation is much improved now but now hospitalization has been complicated by significant orthostatic hypotension to the point where she gets near syncopal  Assessment and Plan:  Acute Metabolic Encephalopathy, improved -Unclear precipitant.   -She was disoriented, not paranoid, hallucinating.  She denies alcohol use or other drugs.   -She was treated for infection here initially, but had no fever, leukocytosis, abx were stopped and she has had no clinical change. -Mental status appears to be back to baseline, intermittently patient does have some dysarthria. -Evaluated with an MRI which showed small foci of susceptibility artifact  in the high left frontoparietal region and right cerebral hemisphere which may reflect chronic microhemorrhages or small cavernoma's but no acute stroke or subacute stroke identified. -Consulted Neurology, appreciate recommendations, advised outpatient follow-up MRI and to continue Coumadin.  Dysarthria -There is no tongue swelling.   -She reported that this started several weeks prior to admission (not the night before as some notes indicate).  It was initially more severe, then improved a little, but is still present. -Patient reports intermittent dysarthria nurse also test the same.  No difficulty with swallowing. -Evaluated with an MRI which showed small foci of susceptibility artifact in the high left frontoparietal region and right cerebral hemisphere which may reflect chronic microhemorrhages or small cavernoma's but no acute stroke or subacute stroke identified. -Consulted Neurology and appreciated recommendations, advised outpatient follow-up MRI and to continue Coumadin.  Elevated TSH -Check Free T4 this is pending -TSH was 7.074   Renal Infarct -There was initial concern that this represented infection, but UA normal, UCx no growth.  Suspect small infarct due to missed warfarin. -Renal U/S done and showed "No collecting system dilatation. Subtle area of echogenicity in the upper pole of the right kidney corresponding to the finding by prior CT. If needed for follow up a postcontrast CT may provide further sensitivity when clinically appropriate." -Continue warfarin -Continue Lovenox bridge -INR is still subtherapeutic, Coumadin dosed by pharmacy   History of aortic valve and mitral valve replacement in the setting of Marfan syndrome -Had run out of warfarin a few weeks prior to admission due to financial issues (lost car, couldn't get to pharmacy) -Continue Warfarin -Changed Heparin bridge to Enoxaparin  -Pharmacy dosing warfarin, INR still subtherapeutic but  improving as PT/INR  was 21.6/1.9   Dizziness and Orthostatic Hypotension -Secondary to orthostasis from dehydration -IV fluid bolus ordered and given 1 Liter the day before yesterday and 500 mL x2 again today and will add midodrine -Order TED Hose and Abdominal Binder -Patient's BP dropped from 110/68 supine -> 119/94 sitting and then went to 79/63 standing she was symptomatic   Hypokalemia, improved -Patient's K+ Level Trend: Recent Labs  Lab 03/17/23 1651 03/19/23 0958 03/20/23 0148 03/21/23 0233 03/22/23 0809 03/23/23 0104 03/24/23 0339  K 4.2 3.0* 2.8* 3.4* 3.5 4.1 3.6  -Continue to Monitor and Replete as Necessary -Repeat CMP in the AM   Depression and History of Bipolar Disorder -Continue Citalopram 20 mg po Daily and Olanzapine 20 mg po qHS -Psychiatry was consulted and recommending referring the patient to Dr. Lolly Mustache at [facility] after discharge and discharge the patient with 1 month supply of olanzapine and Celexa for continuity of care  Normocytic Anemia -Hgb/Hct Trend: Recent Labs  Lab 03/17/23 0656 03/18/23 0044 03/20/23 0148 03/21/23 0233 03/22/23 0809 03/23/23 0104 03/24/23 0339  HGB 10.9* 9.8* 10.5* 11.3* 12.0 11.7* 11.8*  HCT 32.0* 31.3* 32.8* 35.8* 37.3 37.3 37.2  MCV  --  87.4 87.9 89.5 88.8 87.4 87.5  -Check Anemia Panel in the AM -Continue to Monitor for S/Sx of Bleeding given that patient is on Coumadin and on a Lovenox Bridge; No overt bleeding noted -Repeat CBC in the AM   Chronic Systolic Congestive Heart Failure -Appears compensated.   -Not on diuretics at home.  EF this admission recovered to 50-55%.   -Strict I's and O's and Daily Weights  Intake/Output Summary (Last 24 hours) at 03/24/2023 1755 Last data filed at 03/24/2023 1616 Gross per 24 hour  Intake 251.94 ml  Output 5350 ml  Net -5098.06 ml  -Patient is -10.481 Liters since Admit -Continue to Monitor for S/Sx of Volume Overload  Hypoalbuminemia -Patient's Albumin Trend: Recent Labs   Lab 03/17/23 0912 03/17/23 1651 03/19/23 0407 03/20/23 0148 03/22/23 0809 03/23/23 0104 03/24/23 0339  ALBUMIN 3.4* 3.0* 2.9* 3.0* 3.0* 3.0* 2.9*  -Continue to Monitor and Trend and repeat CMP in the AM  Pressure Ulcer, poA Pressure Injury 03/17/23 Sacrum Mid Stage 2 -  Partial thickness loss of dermis presenting as a shallow open injury with a red, pink wound bed without slough. (Active)  03/17/23 1800  Location: Sacrum  Location Orientation: Mid  Staging: Stage 2 -  Partial thickness loss of dermis presenting as a shallow open injury with a red, pink wound bed without slough.  Wound Description (Comments):   Present on Admission: Yes   Active Pressure Injury/Wound(s)     Pressure Ulcer  Duration          Pressure Injury 03/17/23 Sacrum Mid Stage 2 -  Partial thickness loss of dermis presenting as a shallow open injury with a red, pink wound bed without slough. 7 days           Consultants: *** Procedures performed: ***  Disposition:  LEFT AGAINST MEDICAL ADVICE Diet recommendation:  {Diet_Plan:26776} DISCHARGE MEDICATION: Allergies as of 03/25/2023       Reactions   Abilify [aripiprazole] Other (See Comments)   "Flattened affect" "couldn't do anything while taking it"        Medication List     STOP taking these medications    vortioxetine HBr 10 MG Tabs tablet Commonly known as: TRINTELLIX       TAKE these medications  acetaminophen 650 MG CR tablet Commonly known as: TYLENOL Take 1,300 mg by mouth every 8 (eight) hours as needed (for headache/pain relief).   CertaVite/Antioxidants Tabs Take 1 tablet by mouth daily. Centrum Ultra Women's Oral   citalopram 20 MG tablet Commonly known as: CeleXA Take one tablet by mouth once daily What changed: additional instructions   docusate sodium 100 MG capsule Commonly known as: COLACE Take 1 capsule (100 mg total) by mouth 2 (two) times daily as needed for mild constipation.   enoxaparin 80  MG/0.8ML injection Commonly known as: LOVENOX Inject 0.7 mLs (70 mg total) into the skin every 12 (twelve) hours for 7 days. (Discard remaining 0.62ml)   EPINEPHrine 0.3 mg/0.3 mL Soaj injection Commonly known as: EPI-PEN USE AS DIRECTED WHEN NEEDED   levothyroxine 25 MCG tablet Commonly known as: SYNTHROID Take 1 tablet (25 mcg total) by mouth daily at 6 (six) AM. Start taking on: March 26, 2023   midodrine 5 MG tablet Commonly known as: PROAMATINE Take 1 tablet (5 mg total) by mouth 3 (three) times daily with meals.   nicotine 14 mg/24hr patch Commonly known as: NICODERM CQ - dosed in mg/24 hours Place 1 patch (14 mg total) onto the skin daily. Start taking on: March 26, 2023   OLANZapine 10 MG tablet Commonly known as: ZYPREXA TAKE 2 TABLETS (20mg  total) BY MOUTH EVERYDAY AT BEDTIME What changed:  medication strength additional instructions   rizatriptan 10 MG tablet Commonly known as: MAXALT TAKE 1 TABLET BY MOUTH AS NEEDED FOR MIGRAINE. MAY REPEAT IN 2 HOURS IF NEEDED   traZODone 150 MG tablet Commonly known as: DESYREL TAKE 1 TABLET (150mg ) BY MOUTH EVERYDAY AT BEDTIME. What changed: additional instructions   warfarin 5 MG tablet Commonly known as: COUMADIN Take as directed. If you are unsure how to take this medication, talk to your nurse or doctor. Original instructions: TAKE 1 TABLET (5mg ) by mouth DAILY ON SUN, MON, WED, FRI, & SAT. TAKE 1.5 tablets (7.5 MG) ON TUE AND THU What changed: additional instructions               Durable Medical Equipment  (From admission, onward)           Start     Ordered   03/25/23 1321  For home use only DME Walker rolling  Once       Question Answer Comment  Walker: With 5 Inch Wheels   Patient needs a walker to treat with the following condition Weakness      03/25/23 1321            Follow-up Information     Janeece Agee, NP Follow up.   Specialty: Nurse Practitioner Contact information: [name] [place] [address] [phone]                Discharge Exam: Filed Weights   03/19/23 0454 03/21/23 0510 03/22/23 0555  Weight: 76.1 kg 73.2 kg 73.2 kg   Vitals:   03/25/23 0558 03/25/23 1135  BP: 117/67 103/75  Pulse: 86 78  Resp: 15   Temp: 100 F (37.8 C)   SpO2: 100%    Examination: Physical Exam:  Constitutional: WN/WD, NAD and appears calm and comfortable Eyes: PERRL, lids and conjunctivae normal, sclerae anicteric  ENMT: External Ears, Nose appear normal. Grossly normal hearing. Mucous membranes are moist. Posterior pharynx clear of any exudate or lesions. Normal dentition.  Neck: Appears normal, supple, no cervical masses, normal ROM,  no appreciable thyromegaly Respiratory: Clear to auscultation bilaterally, no wheezing, rales, rhonchi or crackles. Normal respiratory effort and patient is not tachypenic. No accessory muscle use.  Cardiovascular: RRR, no murmurs / rubs / gallops. S1 and S2 auscultated. No extremity edema. 2+ pedal pulses. No carotid bruits.  Abdomen: Soft, non-tender, non-distended. No masses palpated. No appreciable hepatosplenomegaly. Bowel sounds positive.  GU: Deferred. Musculoskeletal: No clubbing / cyanosis of digits/nails. No joint deformity upper and lower extremities. Good ROM, no contractures. Normal strength and muscle tone.  Skin: No rashes, lesions, ulcers. No induration; Warm and dry.  Neurologic: CN 2-12 grossly intact with no focal deficits. Sensation intact in all 4 Extremities, DTR normal. Strength 5/5 in all 4. Romberg sign cerebellar reflexes not assessed.  Psychiatric: Normal judgment and insight. Alert and oriented x 3. Normal mood and appropriate affect.   Condition at discharge:  GUARDED  The results of significant diagnostics from this hospitalization (including imaging, microbiology, ancillary and laboratory) are listed below for reference.   Imaging Studies: MR BRAIN WO CONTRAST  Result Date:  03/21/2023 CLINICAL DATA:  Neuro deficit, stroke suspected. EXAM: MRI HEAD WITHOUT CONTRAST TECHNIQUE: Multiplanar, multiecho pulse sequences of the brain and surrounding structures were obtained without intravenous contrast. COMPARISON:  CT head 03/17/2023 FINDINGS: Brain: There is no acute intracranial hemorrhage, extra-axial fluid collection, or acute infarct Parenchymal volume is normal. The ventricles are normal in size. There are small foci of susceptibility artifact in the high left frontoparietal cortex near the vertex (7-88) and right cerebellar hemisphere (7-14). Parenchymal signal is otherwise normal. The pituitary and suprasellar region are normal. There is no mass lesion. There is no mass effect or midline shift. Vascular: Normal flow voids. Skull and upper cervical spine: Normal marrow signal. Sinuses/Orbits: The paranasal sinuses are clear. The globes and orbits are unremarkable. Other: None. IMPRESSION: 1. Small foci of susceptibility artifact in the high left frontoparietal region and right cerebellar hemisphere may reflect chronic microhemorrhages or small cavernomas. 2. Otherwise unremarkable brain MRI. Electronically Signed   By: Lesia Hausen M.D.   On: 03/21/2023 18:42   US RENAL  Result Date: 03/19/2023 CLINICAL DATA:  Urinary tract infection EXAM: RENAL / URINARY TRACT ULTRASOUND COMPLETE COMPARISON:  CT angiogram 03/17/2023 FINDINGS: Right Kidney: Renal measurements: 12.0 x 4.8 x 5.0 cm = volume: 147 mL. Echogenicity within normal limits. No mass or hydronephrosis visualized. Subtle area of echogenicity in the upper pole of the right kidney corresponding to the finding by prior CT Left Kidney: Renal measurements: 12.3 x 4.4 x 5.0 cm = volume: 137 mL. Echogenicity within normal limits. No mass or hydronephrosis visualized. Bladder: Appears normal for degree of bladder distention. Other: Contracted urinary bladder. IMPRESSION: No collecting system dilatation. Subtle area of echogenicity in  the upper pole of the right kidney corresponding to the finding by prior CT. If needed for follow up a postcontrast CT may provide further sensitivity when clinically appropriate. Electronically Signed   By: Karen Kays M.D.   On: 03/19/2023 15:45   ECHOCARDIOGRAM COMPLETE  Result Date: 03/17/2023    ECHOCARDIOGRAM REPORT   Patient Name:   MARETA MERVIS Date of Exam: 03/17/2023 Medical Rec #:  [id number]    Height:       74.0 in Accession #:    [id number]   Weight:       156.0 lb Date of Birth:  [birth year]   BSA:          1.956 m Patient Age:    47  years     BP:           110/71 mmHg Patient Gender: F            HR:           92 bpm. Exam Location:  Inpatient Procedure: 2D Echo, Cardiac Doppler and Color Doppler STAT ECHO Indications:    CHF  History:        Patient has prior history of Echocardiogram examinations, most                 recent 11/14/2017. Aortic Valve Disease and Mitral Valve Disease.  Sonographer:    Darlys Gales Referring Phys: [phone] RAKESH V ALVA  Sonographer Comments: Image acquisition challenging due to patient behavioral factors., Image acquisition challenging due to uncooperative patient and Image acquisition challenging due to respiratory motion. IMPRESSIONS  1. Poor quality study Needs TEE to further assess mechanical valves Can also consider fluroscopy to r/o stenotic disc motion in bi leaflet valves.  2. Poor endocardial visualization ? septal hypokinesis. Left ventricular ejection fraction, by estimation, is 50 to 55%. The left ventricle has low normal function. The left ventricle has no regional wall motion abnormalities. Left ventricular diastolic  parameters are indeterminate.  3. ? pacing wires in RA/RV. Right ventricular systolic function is moderately reduced. The right ventricular size is moderately enlarged.  4. 29 mm Mechanical St Jude valve not well visualized diastolic gradients severely elevated mean 20 mmHg at HR 82 bpm . The mitral valve has been repaired/replaced. No evidence  of mitral valve regurgitation. No evidence of mitral stenosis.  5. 23 mm St Jude AVR not well visulazied mean gradient elevated 23 mmHg peak 31 mmHg also appears to be some AR that is poorly evaluated. The aortic valve has been repaired/replaced. Aortic valve regurgitation is not visualized. No aortic stenosis is present.  6. The inferior vena cava is normal in size with greater than 50% respiratory variability, suggesting right atrial pressure of 3 mmHg. FINDINGS  Left Ventricle: Poor endocardial visualization ? septal hypokinesis. Left ventricular ejection fraction, by estimation, is 50 to 55%. The left ventricle has low normal function. The left ventricle has no regional wall motion abnormalities. The left ventricular internal cavity size was normal in size. There is no left ventricular hypertrophy. Left ventricular diastolic parameters are indeterminate. Right Ventricle: ? pacing wires in RA/RV. The right ventricular size is moderately enlarged. No increase in right ventricular wall thickness. Right ventricular systolic function is moderately reduced. Left Atrium: Left atrial size was normal in size. Right Atrium: Right atrial size was normal in size. Pericardium: There is no evidence of pericardial effusion. Mitral Valve: 29 mm Mechanical St Jude valve not well visualized diastolic gradients severely elevated mean 20 mmHg at HR 82 bpm. The mitral valve has been repaired/replaced. No evidence of mitral valve regurgitation. There is a St. Jude mechanical valve  present in the mitral position. No evidence of mitral valve stenosis. MV peak gradient, 23.8 mmHg. The mean mitral valve gradient is 16.0 mmHg. Tricuspid Valve: The tricuspid valve is normal in structure. Tricuspid valve regurgitation is mild . No evidence of tricuspid stenosis. Aortic Valve: 23 mm St Jude AVR not well visulazied mean gradient elevated 23 mmHg peak 31 mmHg also appears to be some AR that is poorly evaluated. The aortic valve has been  repaired/replaced. Aortic valve regurgitation is not visualized. No aortic stenosis is present. Aortic valve mean gradient measures 23.0 mmHg. Aortic valve peak gradient measures 30.9 mmHg.  There is a St. Jude bileaflet valve present in the aortic position. Pulmonic Valve: The pulmonic valve was not well visualized. Pulmonic valve regurgitation is mild. No evidence of pulmonic stenosis. Aorta: The aortic root is normal in size and structure. Venous: The inferior vena cava is normal in size with greater than 50% respiratory variability, suggesting right atrial pressure of 3 mmHg. IAS/Shunts: No atrial level shunt detected by color flow Doppler. Additional Comments: Poor quality study Needs TEE to further assess mechanical valves Can also consider fluroscopy to r/o stenotic disc motion in bi leaflet valves.  LEFT VENTRICLE PLAX 2D LVIDd:         5.80 cm LVIDs:         3.50 cm LV PW:         1.10 cm LV IVS:        0.90 cm LVOT diam:     1.90 cm LVOT Area:     2.84 cm  AORTIC VALVE AV Vmax:      278.00 cm/s AV Vmean:     237.000 cm/s AV VTI:       0.581 m AV Peak Grad: 30.9 mmHg AV Mean Grad: 23.0 mmHg  AORTA Ao Root diam: 2.90 cm Ao Asc diam:  2.70 cm MITRAL VALVE                TRICUSPID VALVE MV Area (PHT): 2.46 cm     TR Peak grad:   21.5 mmHg MV Peak grad:  23.8 mmHg    TR Vmax:        232.00 cm/s MV Mean grad:  16.0 mmHg MV Vmax:       2.44 m/s     SHUNTS MV Vmean:      196.0 cm/s   Systemic Diam: 1.90 cm MV Decel Time: 309 msec MV E velocity: 179.00 cm/s MV A velocity: 193.00 cm/s MV E/A ratio:  0.93 Charlton Haws MD Electronically signed by Charlton Haws MD Signature Date/Time: 03/17/2023/9:52:02 AM    Final    CT Angio Chest/Abd/Pel for Dissection W and/or Wo Contrast  Result Date: 03/17/2023 CLINICAL DATA:  Suspected acute aortic syndrome, slurred speech EXAM: CT ANGIOGRAPHY CHEST, ABDOMEN AND PELVIS TECHNIQUE: Multidetector CT imaging through the chest, abdomen and pelvis was performed using the standard  protocol during bolus administration of intravenous contrast. Multiplanar reconstructed images and MIPs were obtained and reviewed to evaluate the vascular anatomy. RADIATION DOSE REDUCTION: This exam was performed according to the departmental dose-optimization program which includes automated exposure control, adjustment of the mA and/or kV according to patient size and/or use of iterative reconstruction technique. CONTRAST:  75mL OMNIPAQUE IOHEXOL 350 MG/ML SOLN COMPARISON:  12/06/2016 FINDINGS: CTA CHEST FINDINGS Cardiovascular: Left subclavian AICD extends towards the RV apex. The SVC is patent. Borderline cardiomegaly with left atrial enlargement. No pericardial effusion. Dilated central pulmonary arteries suggesting pulmonary arterial hypertension. Satisfactory opacification of pulmonary arteries noted, and there is no evidence of pulmonary emboli. Post MVR. Post AVR. Mild scattered coronary calcifications. Adequate contrast opacification of the thoracic aorta with no evidence of dissection, aneurysm, or stenosis. There is classic 3-vessel brachiocephalic arch anatomy without proximal stenosis. Minimal scattered calcified plaque in the arch and descending segment. Mediastinum/Nodes: No mass or adenopathy. Lungs/Pleura: No pleural effusion. No pneumothorax. Pulmonary emphysema. Dependent atelectasis posteriorly in both lungs. Musculoskeletal: Sternotomy wires. Benign T5 vertebral body hemangioma. No acute findings. Review of the MIP images confirms the above findings. CTA ABDOMEN AND PELVIS FINDINGS VASCULAR Aorta: Borderline 3.1 cm dilatation  of the suprarenal aorta, with scattered calcified plaque, no infrarenal aneurysm. No dissection or stenosis. Celiac: Patent without evidence of aneurysm, dissection, vasculitis or significant stenosis. SMA: Patent without evidence of aneurysm, dissection, vasculitis or significant stenosis. Renals: Both renal arteries are patent without evidence of aneurysm, dissection,  vasculitis, fibromuscular dysplasia or significant stenosis. IMA: Patent without evidence of aneurysm, dissection, vasculitis or significant stenosis. Inflow: Mild scattered atheromatous calcified plaque in bilateral common iliac arteries. No aneurysm, dissection, or stenosis. Veins: No obvious venous abnormality within the limitations of this arterial phase study. Review of the MIP images confirms the above findings. NON-VASCULAR Hepatobiliary: No focal liver lesion or biliary ductal dilatation. The gallbladder is distended, without calcified gallstones. Pancreas: Unremarkable. No pancreatic ductal dilatation or surrounding inflammatory changes. Spleen: Normal in size without focal abnormality. Adrenals/Urinary Tract: No adrenal mass. Small wedge-shaped region of hypoenhancement in the upper pole right kidney, new since previous. No hydronephrosis. Urinary bladder is distended. Stomach/Bowel: Stomach is partially distended, unremarkable. Small bowel decompressed. Appendix not discretely identified. No pericecal inflammatory/edematous change. The colon is partially distended by gas and fecal material without acute finding. Lymphatic: No abdominal or pelvic adenopathy. Reproductive: Uterus and bilateral adnexa are unremarkable. Other: No ascites.  No free air. Musculoskeletal: Large sacral Tarlov cysts. Mild lumbar levoscoliosis with degenerative disc disease L2-3. Review of the MIP images confirms the above findings. IMPRESSION: 1. No evidence of aortic dissection or other acute vascular pathology. 2. Small wedge-shaped region of hypoenhancement in the upper pole right kidney, new since previous. This could represent a small infarct or pyelonephritis. 3. 3.1 cm suprarenal abdominal aortic aneurysm. Recommend follow-up ultrasound every 3 years all all all all. This recommendation follows ACR consensus guidelines: White Paper of the ACR Incidental Findings Committee II on Vascular Findings. J Am Coll Radiol 2013;  47:829-562. 4. Aortic Atherosclerosis (ICD10-I70.0) and Emphysema (ICD10-J43.9). Electronically Signed   By: Corlis Leak M.D.   On: 03/17/2023 06:27   CT HEAD WO CONTRAST  Result Date: 03/17/2023 CLINICAL DATA:  Neuro deficit with acute stroke suspected. Slurred speech and off balance feeling with dizziness and shortness of breath since early this morning. EXAM: CT ANGIOGRAPHY HEAD AND NECK WITH AND WITHOUT CONTRAST TECHNIQUE: Multidetector CT imaging of the head and neck was performed using the standard protocol during bolus administration of intravenous contrast. Multiplanar CT image reconstructions and MIPs were obtained to evaluate the vascular anatomy. Carotid stenosis measurements (when applicable) are obtained utilizing NASCET criteria, using the distal internal carotid diameter as the denominator. RADIATION DOSE REDUCTION: This exam was performed according to the departmental dose-optimization program which includes automated exposure control, adjustment of the mA and/or kV according to patient size and/or use of iterative reconstruction technique. CONTRAST:  75mL OMNIPAQUE IOHEXOL 350 MG/ML SOLN COMPARISON:  05/05/2022 FINDINGS: CT HEAD FINDINGS Brain: Notable streak artifact partially due to head positioning. No evidence of infarct, hemorrhage, hydrocephalus, or mass. Vascular: See below Skull: Unremarkable Sinuses/Orbits: Negative Review of the MIP images confirms the above findings CTA NECK FINDINGS Aortic arch: 3 vessel branching.  No dilatation Right carotid system: Tortuosity with looping and beading of the ICA. No stenosis or dissection. Left carotid system: Abnormal tortuosity in the setting of connective tissue disease with ICA looping and fusiform dilatation. Aneurysmal ICA dilatation is unchanged with up to 11 mm diameter proximally. No superimposed dissection or stenosis. Vertebral arteries: No proximal subclavian stenosis. Highly tortuous vertebral arteries with marked tortuosity throughout  the neck. No superimposed dissection or stenosis. Skeleton: Kyphotic curvature of  the cervical spine with degenerative endplate spurring. Other neck: No acute finding. Upper chest: Left-sided pacer lead.  Apical emphysema. Review of the MIP images confirms the above findings CTA HEAD FINDINGS Anterior circulation: Mild arterial tortuosity. No branch occlusion, beading, or aneurysm. Hypoplastic right A1 segment. Azygous height A2 segment. Posterior circulation: Vertebrobasilar arteries are smoothly contoured and diffusely patent. No branch occlusion, beading, or aneurysm Venous sinuses: Unremarkable Anatomic variants: None significant Review of the MIP images confirms the above findings IMPRESSION: 1. No emergent vascular finding or stenosis. 2. Chronic arterial manifestation of connective tissue disease especially affecting arteries in the neck with generalized vessel tortuosity, right ICA beading, and left ICA fusiform aneurysm formation measuring up to 11 mm in diameter. 3. Emphysema. Electronically Signed   By: Tiburcio Pea M.D.   On: 03/17/2023 06:01   CT ANGIO HEAD NECK W WO CM  Result Date: 03/17/2023 CLINICAL DATA:  Neuro deficit with acute stroke suspected. Slurred speech and off balance feeling with dizziness and shortness of breath since early this morning. EXAM: CT ANGIOGRAPHY HEAD AND NECK WITH AND WITHOUT CONTRAST TECHNIQUE: Multidetector CT imaging of the head and neck was performed using the standard protocol during bolus administration of intravenous contrast. Multiplanar CT image reconstructions and MIPs were obtained to evaluate the vascular anatomy. Carotid stenosis measurements (when applicable) are obtained utilizing NASCET criteria, using the distal internal carotid diameter as the denominator. RADIATION DOSE REDUCTION: This exam was performed according to the departmental dose-optimization program which includes automated exposure control, adjustment of the mA and/or kV according to  patient size and/or use of iterative reconstruction technique. CONTRAST:  75mL OMNIPAQUE IOHEXOL 350 MG/ML SOLN COMPARISON:  05/05/2022 FINDINGS: CT HEAD FINDINGS Brain: Notable streak artifact partially due to head positioning. No evidence of infarct, hemorrhage, hydrocephalus, or mass. Vascular: See below Skull: Unremarkable Sinuses/Orbits: Negative Review of the MIP images confirms the above findings CTA NECK FINDINGS Aortic arch: 3 vessel branching.  No dilatation Right carotid system: Tortuosity with looping and beading of the ICA. No stenosis or dissection. Left carotid system: Abnormal tortuosity in the setting of connective tissue disease with ICA looping and fusiform dilatation. Aneurysmal ICA dilatation is unchanged with up to 11 mm diameter proximally. No superimposed dissection or stenosis. Vertebral arteries: No proximal subclavian stenosis. Highly tortuous vertebral arteries with marked tortuosity throughout the neck. No superimposed dissection or stenosis. Skeleton: Kyphotic curvature of the cervical spine with degenerative endplate spurring. Other neck: No acute finding. Upper chest: Left-sided pacer lead.  Apical emphysema. Review of the MIP images confirms the above findings CTA HEAD FINDINGS Anterior circulation: Mild arterial tortuosity. No branch occlusion, beading, or aneurysm. Hypoplastic right A1 segment. Azygous height A2 segment. Posterior circulation: Vertebrobasilar arteries are smoothly contoured and diffusely patent. No branch occlusion, beading, or aneurysm Venous sinuses: Unremarkable Anatomic variants: None significant Review of the MIP images confirms the above findings IMPRESSION: 1. No emergent vascular finding or stenosis. 2. Chronic arterial manifestation of connective tissue disease especially affecting arteries in the neck with generalized vessel tortuosity, right ICA beading, and left ICA fusiform aneurysm formation measuring up to 11 mm in diameter. 3. Emphysema.  Electronically Signed   By: Tiburcio Pea M.D.   On: 03/17/2023 06:01   DG Chest Portable 1 View  Result Date: 03/17/2023 CLINICAL DATA:  Shortness of breath EXAM: PORTABLE CHEST 1 VIEW COMPARISON:  01/06/2017 FINDINGS: Postoperative heart with mitral and aortic valve replacement. Right ventricular ICD lead. Congested appearance of vessels. There is no edema, consolidation,  effusion, or pneumothorax. IMPRESSION: Vascular congestion without edema or focal infiltrate. Electronically Signed   By: Tiburcio Pea M.D.   On: 03/17/2023 05:31    Microbiology: Results for orders placed or performed during the hospital encounter of 03/17/23  Blood culture (routine x 2)     Status: None   Collection Time: 03/17/23  7:20 AM   Specimen: BLOOD LEFT WRIST  Result Value Ref Range Status   Specimen Description BLOOD LEFT WRIST  Final   Special Requests   Final    BOTTLES DRAWN AEROBIC AND ANAEROBIC Blood Culture adequate volume   Culture   Final    NO GROWTH 5 DAYS Performed at [facility], [address], [place], [place] [address]    Report Status 03/22/2023 FINAL  Final  Blood culture (routine x 2)     Status: None   Collection Time: 03/17/23  7:27 AM   Specimen: BLOOD RIGHT HAND  Result Value Ref Range Status   Specimen Description BLOOD RIGHT HAND  Final   Special Requests   Final    BOTTLES DRAWN AEROBIC AND ANAEROBIC Blood Culture adequate volume   Culture   Final    NO GROWTH 5 DAYS Performed at [facility], [address], [place], [place] [address]    Report Status 03/22/2023 FINAL  Final  Urine Culture (for pregnant, neutropenic or urologic patients or patients with an indwelling urinary catheter)     Status: None   Collection Time: 03/17/23  9:10 AM   Specimen: Urine, Clean Catch  Result Value Ref Range Status   Specimen Description URINE, CLEAN CATCH  Final   Special Requests NONE  Final   Culture   Final    NO GROWTH Performed at [facility], [address] [place], [place] [address]    Report Status 03/18/2023 FINAL  Final  MRSA Next Gen by PCR, Nasal     Status: None   Collection Time: 03/17/23  5:12 PM   Specimen: Nasal Mucosa; Nasal Swab  Result Value Ref Range Status   MRSA by PCR Next Gen NOT DETECTED NOT DETECTED Final    Comment: (NOTE) The GeneXpert MRSA Assay (FDA approved for NASAL specimens only), is one component of a comprehensive MRSA colonization surveillance program. It is not intended to diagnose MRSA infection nor to guide or monitor treatment for MRSA infections. Test performance is not FDA approved in patients less than 19 years old. Performed at [facility], [address] [place], [place] [address]     Labs: CBC: Recent Labs  Lab 03/21/23 0233 03/22/23 0809 03/23/23 0104 03/24/23 0339 03/25/23 0151  WBC 4.4 7.2 7.1 7.4 7.4  NEUTROABS  --  5.4 4.6 5.1 4.6  HGB 11.3* 12.0 11.7* 11.8* 11.5*  HCT 35.8* 37.3 37.3 37.2 35.0*  MCV 89.5 88.8 87.4 87.5 88.2  PLT 223 223 212 203 177   Basic Metabolic Panel: Recent Labs  Lab 03/19/23 0958 03/20/23 0148 03/21/23 0233 03/22/23 0809 03/23/23 0104 03/24/23 0339 03/25/23 0151  NA 143   < > 141 139 140 141 137  K 3.0*   < > 3.4* 3.5 4.1 3.6 3.4*  CL 111   < > 111 109 107 105 106  CO2 22   < > 21* 24 25 24  21*  GLUCOSE 84   < > 86 125* 86 91 86  BUN 17   < > 5* <5* 6 <5* 7  CREATININE 0.67   < >  0.59 0.76 0.74 0.84 0.77  CALCIUM 8.0*   < > 8.1* 8.6* 8.6* 8.5* 8.2*  MG 1.8  --   --   --   --  1.8 1.9  PHOS  --   --   --   --   --  3.1 3.0   < > = values in this interval not displayed.   Liver Function Tests: Recent Labs  Lab 03/20/23 0148 03/22/23 0809 03/23/23 0104 03/24/23 0339 03/25/23 0151  AST 25 14* 15 26 17   ALT 15 15 16 25 21   ALKPHOS 32* 35* 36* 34* 40  BILITOT 0.4 0.3 0.7 0.4 0.6  PROT 5.3* 5.4* 5.5* 5.5* 5.6*  ALBUMIN 3.0* 3.0* 3.0* 2.9* 2.8*   CBG: Recent Labs  Lab 03/19/23 0314 03/19/23 0406 03/19/23 0756  03/19/23 1126 03/19/23 1609  GLUCAP 63* 124* 83 83 80   Discharge time spent: greater than 30 minutes.  Signed: Merlene Laughter, DO [facility] 03/25/2023

## 2023-03-25 NOTE — Progress Notes (Signed)
Patient expressed strong desire to leave AMA citing financial concerns and fear of job loss due to prolonged hospitalization. Despite education on fall risk and the risks associated with leaving AMA, patient insisted on discharge. AMA paperwork completed, IV and Foley removed, TOC meds given.Pt awaiting transportation. MD notified.

## 2023-03-25 NOTE — Progress Notes (Addendum)
ANTICOAGULATION CONSULT NOTE - Follow-up Consult  Pharmacy Consult for heparin >> Lovenox + warfarin Indication: aortic and mitral valve replacements setting of Marfan syndrome   Allergies  Allergen Reactions   Abilify [Aripiprazole] Other (See Comments)    "Flattened affect" "couldn't do anything while taking it"    Patient Measurements: Height: 6\' 2"  (188 cm) Weight: 73.2 kg (161 lb 6.4 oz) IBW/kg (Calculated) : 77.7 Heparin Dosing Weight: 70 kg  Vital Signs: Temp: 100 F (37.8 C) (06/14 0558) Temp Source: Oral (06/14 0558) BP: 117/67 (06/14 0558) Pulse Rate: 86 (06/14 0558)  Labs: Recent Labs    03/23/23 0104 03/23/23 0914 03/24/23 0339 03/25/23 0151  HGB 11.7*  --  11.8* 11.5*  HCT 37.3  --  37.2 35.0*  PLT 212  --  203 177  LABPROT 17.7*  --  21.6* 23.1*  INR 1.4*  --  1.9* 2.0*  HEPARINUNFRC 0.27* 0.28*  --   --   CREATININE 0.74  --  0.84 0.77     Estimated Creatinine Clearance: 101.5 mL/min (by C-G formula based on SCr of 0.77 mg/dL).   Medical History: Past Medical History:  Diagnosis Date   Anticoagulant long-term use    after valve surgery for Marfan's syndrome   Anxiety    Ascending aorta dilatation (HCC)    Bipolar disorder (HCC)    Depression    Marfan's syndrome    Dr. Beverely Pace   Migraine    Mitral valve prolapse    Palpitations    Suicidal behavior    Tylenol Overdose   Varicose veins     Assessment: 47 yo female with history of aortic valve replacement setting of Marfan syndrome admitted with AMS. Patient previously on warfarin; however, has not taken in 3 months due to trouble obtaining medication. Pharmacy consulted to resume warfarin.   Last documented (2023) PTA regimen: warfarin 5mg  Monday, Wednesday, Friday, Saturday, Sunday and 7.5mg  on Tuesday, Thursday.  INR 2 today. CBC stable.   Goal of Therapy:  Anti-Xa level 0.6-1 units/ml 4hrs after LMWH dose given at steady state as indicated INR goal 2.5-3.5 Monitor platelets by  anticoagulation protocol: Yes   Plan:  Lovenox 1mg /kg q12h [place] (continue until INR > 2.5) Warfarin 5 mg PO x1  Daily CBC and INR Monitor for s/sx of bleeding  Patient would benefit from [facility] pharmacy at discharge  Rexford Maus, PharmD, BCPS 03/25/2023 7:12 AM  AM UPDATE:  Informed by MD that patient is leaving AMA. Would continue previous home regimen of warfarin at 5mg  daily except for 7.5mg  on Tuesday and Thursday. Continue Lovenox 1mg /kg [place] q12h until INR > 2.5. Will need INR check Mon/Tues if possible.  Rexford Maus, PharmD, BCPS 03/25/2023 10:19 AM

## 2023-03-26 LAB — T3: T3, Total: 88 ng/dL (ref 71–180)
